# Patient Record
Sex: Female | Born: 1955 | Race: Black or African American | Hispanic: No | Marital: Married | Smoking: Never smoker
Health system: Southern US, Community
[De-identification: ages and names within clinical notes are randomized; demographics above are authoritative.]

## PROBLEM LIST (undated history)

## (undated) ENCOUNTER — Ambulatory Visit (HOSPITAL_COMMUNITY): Admission: EM | Payer: Medicare Other | Source: Home / Self Care

## (undated) DIAGNOSIS — I10 Essential (primary) hypertension: Secondary | ICD-10-CM

## (undated) DIAGNOSIS — E785 Hyperlipidemia, unspecified: Secondary | ICD-10-CM

## (undated) DIAGNOSIS — D219 Benign neoplasm of connective and other soft tissue, unspecified: Secondary | ICD-10-CM

## (undated) DIAGNOSIS — R42 Dizziness and giddiness: Secondary | ICD-10-CM

## (undated) DIAGNOSIS — E876 Hypokalemia: Secondary | ICD-10-CM

## (undated) DIAGNOSIS — IMO0002 Reserved for concepts with insufficient information to code with codable children: Secondary | ICD-10-CM

## (undated) DIAGNOSIS — N12 Tubulo-interstitial nephritis, not specified as acute or chronic: Secondary | ICD-10-CM

## (undated) HISTORY — PX: BACK SURGERY: SHX140

## (undated) HISTORY — DX: Hyperlipidemia, unspecified: E78.5

## (undated) HISTORY — PX: SKIN BIOPSY: SHX1

## (undated) HISTORY — PX: NECK SURGERY: SHX720

## (undated) HISTORY — DX: Benign neoplasm of connective and other soft tissue, unspecified: D21.9

---

## 1998-03-24 ENCOUNTER — Emergency Department (HOSPITAL_COMMUNITY): Admission: EM | Admit: 1998-03-24 | Discharge: 1998-03-24 | Payer: Self-pay | Admitting: Emergency Medicine

## 1998-03-26 ENCOUNTER — Emergency Department (HOSPITAL_COMMUNITY): Admission: EM | Admit: 1998-03-26 | Discharge: 1998-03-26 | Payer: Self-pay | Admitting: Emergency Medicine

## 1998-07-27 ENCOUNTER — Ambulatory Visit (HOSPITAL_COMMUNITY): Admission: RE | Admit: 1998-07-27 | Discharge: 1998-07-27 | Payer: Self-pay

## 1999-08-07 ENCOUNTER — Emergency Department (HOSPITAL_COMMUNITY): Admission: EM | Admit: 1999-08-07 | Discharge: 1999-08-07 | Payer: Self-pay | Admitting: Emergency Medicine

## 1999-08-07 ENCOUNTER — Encounter: Payer: Self-pay | Admitting: Emergency Medicine

## 1999-12-23 HISTORY — PX: CHOLECYSTECTOMY: SHX55

## 1999-12-23 HISTORY — PX: ERCP W/ SPHICTEROTOMY: SHX1523

## 2000-03-07 ENCOUNTER — Emergency Department (HOSPITAL_COMMUNITY): Admission: EM | Admit: 2000-03-07 | Discharge: 2000-03-07 | Payer: Self-pay | Admitting: *Deleted

## 2000-03-09 ENCOUNTER — Ambulatory Visit (HOSPITAL_COMMUNITY): Admission: RE | Admit: 2000-03-09 | Discharge: 2000-03-09 | Payer: Self-pay | Admitting: *Deleted

## 2000-03-09 ENCOUNTER — Encounter: Payer: Self-pay | Admitting: *Deleted

## 2000-03-23 ENCOUNTER — Encounter (INDEPENDENT_AMBULATORY_CARE_PROVIDER_SITE_OTHER): Payer: Self-pay | Admitting: Specialist

## 2000-03-23 ENCOUNTER — Encounter: Payer: Self-pay | Admitting: General Surgery

## 2000-03-24 ENCOUNTER — Encounter: Payer: Self-pay | Admitting: Internal Medicine

## 2000-03-24 ENCOUNTER — Inpatient Hospital Stay (HOSPITAL_COMMUNITY): Admission: RE | Admit: 2000-03-24 | Discharge: 2000-03-25 | Payer: Self-pay | Admitting: General Surgery

## 2000-07-22 ENCOUNTER — Emergency Department (HOSPITAL_COMMUNITY): Admission: EM | Admit: 2000-07-22 | Discharge: 2000-07-22 | Payer: Self-pay | Admitting: Emergency Medicine

## 2001-01-18 ENCOUNTER — Emergency Department (HOSPITAL_COMMUNITY): Admission: EM | Admit: 2001-01-18 | Discharge: 2001-01-19 | Payer: Self-pay | Admitting: Emergency Medicine

## 2001-01-20 ENCOUNTER — Emergency Department (HOSPITAL_COMMUNITY): Admission: EM | Admit: 2001-01-20 | Discharge: 2001-01-20 | Payer: Self-pay | Admitting: Emergency Medicine

## 2002-06-15 ENCOUNTER — Emergency Department (HOSPITAL_COMMUNITY): Admission: EM | Admit: 2002-06-15 | Discharge: 2002-06-15 | Payer: Self-pay | Admitting: Emergency Medicine

## 2002-06-20 ENCOUNTER — Emergency Department (HOSPITAL_COMMUNITY): Admission: EM | Admit: 2002-06-20 | Discharge: 2002-06-20 | Payer: Self-pay | Admitting: Emergency Medicine

## 2002-06-20 ENCOUNTER — Encounter: Payer: Self-pay | Admitting: Emergency Medicine

## 2002-06-26 ENCOUNTER — Emergency Department (HOSPITAL_COMMUNITY): Admission: EM | Admit: 2002-06-26 | Discharge: 2002-06-26 | Payer: Self-pay | Admitting: Emergency Medicine

## 2002-07-01 ENCOUNTER — Emergency Department (HOSPITAL_COMMUNITY): Admission: EM | Admit: 2002-07-01 | Discharge: 2002-07-01 | Payer: Self-pay | Admitting: Emergency Medicine

## 2002-07-06 ENCOUNTER — Ambulatory Visit (HOSPITAL_COMMUNITY): Admission: RE | Admit: 2002-07-06 | Discharge: 2002-07-06 | Payer: Self-pay | Admitting: Internal Medicine

## 2002-07-06 ENCOUNTER — Encounter: Payer: Self-pay | Admitting: Internal Medicine

## 2002-12-22 DIAGNOSIS — N12 Tubulo-interstitial nephritis, not specified as acute or chronic: Secondary | ICD-10-CM

## 2002-12-22 HISTORY — DX: Tubulo-interstitial nephritis, not specified as acute or chronic: N12

## 2003-01-24 ENCOUNTER — Ambulatory Visit (HOSPITAL_COMMUNITY): Admission: RE | Admit: 2003-01-24 | Discharge: 2003-01-24 | Payer: Self-pay | Admitting: Family Medicine

## 2003-01-27 ENCOUNTER — Encounter: Payer: Self-pay | Admitting: Emergency Medicine

## 2003-01-27 ENCOUNTER — Inpatient Hospital Stay (HOSPITAL_COMMUNITY): Admission: EM | Admit: 2003-01-27 | Discharge: 2003-01-30 | Payer: Self-pay | Admitting: Emergency Medicine

## 2003-01-29 ENCOUNTER — Encounter: Payer: Self-pay | Admitting: Family Medicine

## 2003-01-31 ENCOUNTER — Inpatient Hospital Stay (HOSPITAL_COMMUNITY): Admission: EM | Admit: 2003-01-31 | Discharge: 2003-02-02 | Payer: Self-pay | Admitting: Emergency Medicine

## 2003-02-01 ENCOUNTER — Encounter: Payer: Self-pay | Admitting: Family Medicine

## 2003-02-06 ENCOUNTER — Encounter: Admission: RE | Admit: 2003-02-06 | Discharge: 2003-02-06 | Payer: Self-pay | Admitting: Family Medicine

## 2003-05-11 ENCOUNTER — Inpatient Hospital Stay (HOSPITAL_COMMUNITY): Admission: AD | Admit: 2003-05-11 | Discharge: 2003-05-11 | Payer: Self-pay | Admitting: Family Medicine

## 2003-07-04 ENCOUNTER — Encounter: Admission: RE | Admit: 2003-07-04 | Discharge: 2003-07-04 | Payer: Self-pay | Admitting: Internal Medicine

## 2003-08-08 ENCOUNTER — Encounter: Admission: RE | Admit: 2003-08-08 | Discharge: 2003-08-08 | Payer: Self-pay | Admitting: Internal Medicine

## 2003-09-02 ENCOUNTER — Encounter: Payer: Self-pay | Admitting: Emergency Medicine

## 2003-09-02 ENCOUNTER — Emergency Department (HOSPITAL_COMMUNITY): Admission: EM | Admit: 2003-09-02 | Discharge: 2003-09-02 | Payer: Self-pay | Admitting: Emergency Medicine

## 2003-09-11 ENCOUNTER — Encounter: Admission: RE | Admit: 2003-09-11 | Discharge: 2003-09-11 | Payer: Self-pay | Admitting: Internal Medicine

## 2003-10-19 ENCOUNTER — Encounter: Admission: RE | Admit: 2003-10-19 | Discharge: 2003-10-19 | Payer: Self-pay | Admitting: Obstetrics and Gynecology

## 2003-10-25 ENCOUNTER — Encounter: Admission: RE | Admit: 2003-10-25 | Discharge: 2003-10-25 | Payer: Self-pay | Admitting: Internal Medicine

## 2003-10-27 ENCOUNTER — Ambulatory Visit (HOSPITAL_COMMUNITY): Admission: RE | Admit: 2003-10-27 | Discharge: 2003-10-27 | Payer: Self-pay | Admitting: Obstetrics and Gynecology

## 2003-11-21 ENCOUNTER — Encounter: Admission: RE | Admit: 2003-11-21 | Discharge: 2003-11-21 | Payer: Self-pay | Admitting: Internal Medicine

## 2004-01-04 ENCOUNTER — Ambulatory Visit (HOSPITAL_COMMUNITY): Admission: RE | Admit: 2004-01-04 | Discharge: 2004-01-04 | Payer: Self-pay | Admitting: Internal Medicine

## 2004-01-04 ENCOUNTER — Encounter: Admission: RE | Admit: 2004-01-04 | Discharge: 2004-01-04 | Payer: Self-pay | Admitting: Internal Medicine

## 2004-02-28 ENCOUNTER — Encounter: Admission: RE | Admit: 2004-02-28 | Discharge: 2004-02-28 | Payer: Self-pay | Admitting: Internal Medicine

## 2004-05-09 ENCOUNTER — Ambulatory Visit (HOSPITAL_COMMUNITY): Admission: RE | Admit: 2004-05-09 | Discharge: 2004-05-09 | Payer: Self-pay | Admitting: Obstetrics

## 2004-10-21 ENCOUNTER — Emergency Department (HOSPITAL_COMMUNITY): Admission: EM | Admit: 2004-10-21 | Discharge: 2004-10-21 | Payer: Self-pay | Admitting: Family Medicine

## 2004-10-23 ENCOUNTER — Emergency Department (HOSPITAL_COMMUNITY): Admission: EM | Admit: 2004-10-23 | Discharge: 2004-10-23 | Payer: Self-pay

## 2005-03-01 ENCOUNTER — Emergency Department (HOSPITAL_COMMUNITY): Admission: EM | Admit: 2005-03-01 | Discharge: 2005-03-01 | Payer: Self-pay | Admitting: Emergency Medicine

## 2005-09-09 ENCOUNTER — Emergency Department (HOSPITAL_COMMUNITY): Admission: EM | Admit: 2005-09-09 | Discharge: 2005-09-09 | Payer: Self-pay | Admitting: Family Medicine

## 2005-12-30 ENCOUNTER — Emergency Department (HOSPITAL_COMMUNITY): Admission: EM | Admit: 2005-12-30 | Discharge: 2005-12-30 | Payer: Self-pay | Admitting: Emergency Medicine

## 2006-01-02 ENCOUNTER — Encounter: Admission: RE | Admit: 2006-01-02 | Discharge: 2006-01-02 | Payer: Self-pay | Admitting: Internal Medicine

## 2007-02-09 ENCOUNTER — Emergency Department (HOSPITAL_COMMUNITY): Admission: EM | Admit: 2007-02-09 | Discharge: 2007-02-09 | Payer: Self-pay | Admitting: Family Medicine

## 2007-03-28 ENCOUNTER — Emergency Department (HOSPITAL_COMMUNITY): Admission: EM | Admit: 2007-03-28 | Discharge: 2007-03-28 | Payer: Self-pay | Admitting: Emergency Medicine

## 2007-08-18 ENCOUNTER — Emergency Department (HOSPITAL_COMMUNITY): Admission: EM | Admit: 2007-08-18 | Discharge: 2007-08-18 | Payer: Self-pay | Admitting: Family Medicine

## 2007-09-29 ENCOUNTER — Ambulatory Visit (HOSPITAL_COMMUNITY): Admission: RE | Admit: 2007-09-29 | Discharge: 2007-09-29 | Payer: Self-pay | Admitting: *Deleted

## 2007-10-05 ENCOUNTER — Encounter: Admission: RE | Admit: 2007-10-05 | Discharge: 2007-10-05 | Payer: Self-pay | Admitting: *Deleted

## 2007-11-08 ENCOUNTER — Emergency Department (HOSPITAL_COMMUNITY): Admission: EM | Admit: 2007-11-08 | Discharge: 2007-11-08 | Payer: Self-pay | Admitting: Emergency Medicine

## 2008-07-29 ENCOUNTER — Emergency Department (HOSPITAL_COMMUNITY): Admission: EM | Admit: 2008-07-29 | Discharge: 2008-07-29 | Payer: Self-pay | Admitting: Emergency Medicine

## 2008-11-27 ENCOUNTER — Ambulatory Visit (HOSPITAL_COMMUNITY): Admission: RE | Admit: 2008-11-27 | Discharge: 2008-11-27 | Payer: Self-pay | Admitting: *Deleted

## 2009-01-01 ENCOUNTER — Emergency Department (HOSPITAL_COMMUNITY): Admission: EM | Admit: 2009-01-01 | Discharge: 2009-01-01 | Payer: Self-pay | Admitting: Emergency Medicine

## 2009-03-02 ENCOUNTER — Encounter: Admission: RE | Admit: 2009-03-02 | Discharge: 2009-03-02 | Payer: Self-pay | Admitting: Neurosurgery

## 2009-03-14 ENCOUNTER — Encounter: Admission: RE | Admit: 2009-03-14 | Discharge: 2009-03-14 | Payer: Self-pay | Admitting: Neurosurgery

## 2009-04-05 ENCOUNTER — Emergency Department (HOSPITAL_COMMUNITY): Admission: EM | Admit: 2009-04-05 | Discharge: 2009-04-05 | Payer: Self-pay | Admitting: Family Medicine

## 2009-04-07 ENCOUNTER — Emergency Department (HOSPITAL_COMMUNITY): Admission: EM | Admit: 2009-04-07 | Discharge: 2009-04-07 | Payer: Self-pay | Admitting: Emergency Medicine

## 2009-06-18 ENCOUNTER — Encounter: Admission: RE | Admit: 2009-06-18 | Discharge: 2009-06-18 | Payer: Self-pay | Admitting: Internal Medicine

## 2009-08-19 ENCOUNTER — Encounter: Admission: RE | Admit: 2009-08-19 | Discharge: 2009-08-19 | Payer: Self-pay | Admitting: Neurosurgery

## 2009-10-25 ENCOUNTER — Encounter: Admission: RE | Admit: 2009-10-25 | Discharge: 2009-10-25 | Payer: Self-pay | Admitting: Orthopaedic Surgery

## 2010-12-19 ENCOUNTER — Emergency Department (HOSPITAL_COMMUNITY)
Admission: EM | Admit: 2010-12-19 | Discharge: 2010-12-19 | Payer: Self-pay | Source: Home / Self Care | Admitting: Emergency Medicine

## 2010-12-27 ENCOUNTER — Ambulatory Visit (HOSPITAL_COMMUNITY)
Admission: RE | Admit: 2010-12-27 | Discharge: 2010-12-27 | Payer: Self-pay | Source: Home / Self Care | Attending: Internal Medicine | Admitting: Internal Medicine

## 2011-01-12 ENCOUNTER — Encounter: Payer: Self-pay | Admitting: Internal Medicine

## 2011-01-12 ENCOUNTER — Encounter: Payer: Self-pay | Admitting: Obstetrics and Gynecology

## 2011-01-12 ENCOUNTER — Encounter: Payer: Self-pay | Admitting: *Deleted

## 2011-01-13 ENCOUNTER — Encounter: Payer: Self-pay | Admitting: *Deleted

## 2011-01-14 ENCOUNTER — Inpatient Hospital Stay (HOSPITAL_COMMUNITY)
Admission: AD | Admit: 2011-01-14 | Discharge: 2011-01-14 | Payer: Self-pay | Source: Home / Self Care | Attending: Obstetrics & Gynecology | Admitting: Obstetrics & Gynecology

## 2011-02-05 ENCOUNTER — Emergency Department (HOSPITAL_COMMUNITY)
Admission: EM | Admit: 2011-02-05 | Discharge: 2011-02-05 | Disposition: A | Discharge status: Home / Self Care | Payer: Self-pay | Attending: Emergency Medicine | Admitting: Emergency Medicine

## 2011-02-05 DIAGNOSIS — L2989 Other pruritus: Secondary | ICD-10-CM | POA: Insufficient documentation

## 2011-02-05 DIAGNOSIS — L298 Other pruritus: Secondary | ICD-10-CM | POA: Insufficient documentation

## 2011-02-05 DIAGNOSIS — R21 Rash and other nonspecific skin eruption: Secondary | ICD-10-CM | POA: Insufficient documentation

## 2011-02-05 DIAGNOSIS — I10 Essential (primary) hypertension: Secondary | ICD-10-CM | POA: Insufficient documentation

## 2011-02-05 DIAGNOSIS — Z79899 Other long term (current) drug therapy: Secondary | ICD-10-CM | POA: Insufficient documentation

## 2011-02-14 ENCOUNTER — Other Ambulatory Visit: Payer: Self-pay | Admitting: Dermatology

## 2011-04-02 LAB — POCT I-STAT, CHEM 8
BUN: 13 mg/dL (ref 6–23)
Creatinine, Ser: 0.7 mg/dL (ref 0.4–1.2)
Glucose, Bld: 122 mg/dL — ABNORMAL HIGH (ref 70–99)
HCT: 40 % (ref 36.0–46.0)
Hemoglobin: 13.6 g/dL (ref 12.0–15.0)
TCO2: 25 mmol/L (ref 0–100)

## 2011-04-02 LAB — DIFFERENTIAL
Basophils Absolute: 0 10*3/uL (ref 0.0–0.1)
Eosinophils Absolute: 0.1 10*3/uL (ref 0.0–0.7)
Eosinophils Relative: 1 % (ref 0–5)
Lymphocytes Relative: 45 % (ref 12–46)
Monocytes Relative: 4 % (ref 3–12)
Neutrophils Relative %: 49 % (ref 43–77)

## 2011-04-02 LAB — CBC
MCHC: 34.1 g/dL (ref 30.0–36.0)
Platelets: 224 10*3/uL (ref 150–400)
RBC: 4.26 MIL/uL (ref 3.87–5.11)
WBC: 5.4 10*3/uL (ref 4.0–10.5)

## 2011-04-07 LAB — CBC
HCT: 36.4 % (ref 36.0–46.0)
Hemoglobin: 12.4 g/dL (ref 12.0–15.0)
MCHC: 34 g/dL (ref 30.0–36.0)
RBC: 3.9 MIL/uL (ref 3.87–5.11)
RDW: 14.2 % (ref 11.5–15.5)

## 2011-04-07 LAB — DIFFERENTIAL
Basophils Absolute: 0 10*3/uL (ref 0.0–0.1)
Eosinophils Relative: 1 % (ref 0–5)
Lymphocytes Relative: 39 % (ref 12–46)
Lymphs Abs: 2.6 10*3/uL (ref 0.7–4.0)
Neutro Abs: 3.8 10*3/uL (ref 1.7–7.7)

## 2011-04-07 LAB — BASIC METABOLIC PANEL
CO2: 22 mEq/L (ref 19–32)
Calcium: 9.3 mg/dL (ref 8.4–10.5)
GFR calc Af Amer: >60 mL/min (ref >60)
GFR calc non Af Amer: >60 mL/min (ref >60)
Glucose, Bld: 94 mg/dL (ref 70–99)
Potassium: 3.5 mEq/L (ref 3.5–5.1)
Sodium: 139 mEq/L (ref 135–145)

## 2011-04-07 LAB — TROPONIN I: Troponin I: 0.01 ng/mL (ref 0.00–0.06)

## 2011-04-07 LAB — CK TOTAL AND CKMB (NOT AT ARMC)
CK, MB: 3.9 ng/mL (ref 0.3–4.0)
Relative Index: 1.6 (ref 0.0–2.5)

## 2011-04-07 LAB — PROTIME-INR: Prothrombin Time: 13.4 seconds (ref 11.6–15.2)

## 2011-04-07 LAB — APTT: aPTT: 30 seconds (ref 24–37)

## 2011-05-09 NOTE — Group Therapy Note (Signed)
   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  The patient  has had in the past a cesarean section and a cholecystectomy.  She comes in  with a complaint of abdominal bloating, dysmenorrhea with severe right-sided  and back pain during that time.  She has been otherwise in fairly good  health and is on hydrochlorothiazide for hypertension.  At this time her  blood pressures are normal 137/89.  She weighs 208 pounds and is 5 feet 3  inches.  She had a sonogram done in January that showed two fibroids, one of  6.3 x 5.8 diameter and the second was 5.2 x 4.1.  Also had a recent CAT scan  because of abdominal pain and showed a slightly enlarged right kidney.   PHYSICAL EXAMINATION:  PELVIC:  External genitalia is normal.  BUS within  normal.  Vagina was clean and well rugated.  Cervix was clean and no recent  Pap smear had been done.  The uterus was not easily outlined but there was  obviously fibroid into the cul-de-sac and enlargement of the uterus I would  estimate in total felt like about a 12-14 week sized uterus.   ASSESSMENT/PLAN:  We discussed with the patient possible treatment.  Will  repeat the sonogram to see if it was not a significant growth in the last  six months and will discuss the possibility of hysterectomy versus waiting  until she goes through menopause if she can take ibuprofen for the pain.  Once we get the sonogram we will reconsult with the patient.                                               Argentina Donovan, MD    PR/MEDQ  D:  10/19/2003  T:  10/20/2003  Job:  [id number]

## 2011-05-09 NOTE — Procedures (Signed)
[place]  Patient:    Tasha [name], Tasha [name]                           MRN: [id number] Proc. Date: 03/24/00 Adm. Date:  [id number] Disc. Date: [id number] Attending:  Henrene Dodge CC:         Anselm Pancoast. Zachery Dakins, M.D.                           Procedure Report  PROCEDURE:  Endoscopic retrograde cholangiopancreatography  INDICATIONS:  This 55 year old black female presented with symptomatic cholelithiasis.  She underwent cholecystectomy yesterday with findings of retained common bile duct stones in the common bile duct.  There was also irregularity and incomplete filling of the hepatic duct.  The patient is undergoing today ERCP with plans to remove the common bile duct stones.  ENDOSCOPE:  Olympus single channel side-viewing duodenoscope.  SEDATION:  Versed 10 mg IV, droperidol 5 mg IV, Demerol 100 mg IV, glucagon 2-1/2 mg IV.  FINDINGS:  Olympus single channel side-viewing duodenoscope was advanced via the esophagus into the stomach.  Gastric mucosa was normal with normal pyloric outlet and duodenal bulb.  Accessory papilla was fistulized proximal to the main papilla. The main papilla had some abrasions on it and mild edema.  It was cannulated without difficulty in the short arm, normal common bile duct with multiple small filling defects consistent with stones.  This was confirmed by fluoroscopy provided by Dr. Ronney Asters.  Stones measured 3-5 mm in diameter.  Some of them were floating in the common bile duct.  Sphincterotomy was carried out with a regular sphincterotome and 8-1/2 mm Wilson-Cook balloon passed over the guidewire into he common bile duct, into the common hepatic ducts.  Cholangiogram at that time was obtained and showed complete filling of the intrahepatic ducts and hepatic radicals with no evidence of stricture.  Several sweeps of the common bile duct were made with 8-1/2 mm balloon which initially had to be  deflated to pass through the distal common bile duct.  Sphincterotomy was enlarged to accommodate the entire 8-1/2 mm balloon. Several stones were evacuated and video photographs of these were obtained for documentation but there remained a filling defect in the distal common bile duct thought to be a retained stone.  For that reason sphincterotomy was still enlarged and a 12 mm balloon was passed over the guidewire under fluoroscopic guidance into the proximal hepatic ducts and passed partially deflated through the common bile ducts.  At the end of the procedure all retained stones were removed.  The main pancreatic duct was not intentionally visualized.  The patient tolerated the procedure well.  IMPRESSION: 1. Choledocholithiasis status post endoscopic sphincterotomy and multiple stone    extractions. 2. Status post surgical cholecystectomy.  PLAN: 1. Observation. 2. Will repeat LFTs, amylase in the morning. 3. Continue Unisyn x 3 doses. DD:  03/25/00 TD:  03/25/00 Job: [id number]

## 2011-05-09 NOTE — Discharge Summary (Signed)
[name], [name]                              ACCOUNT NO.:  [id number]   MEDICAL RECORD NO.:  [id number]                   PATIENT TYPE:  INP   LOCATION:  5533                                 FACILITY:  [facility]   PHYSICIAN:  Santiago Bumpers. Hensel, M.D.             DATE OF BIRTH:  29-Sep-1956   DATE OF ADMISSION:  01/31/2003  DATE OF DISCHARGE:  02/02/2003                                 DISCHARGE SUMMARY   DISCHARGE DIAGNOSES:  1. Pyelonephritis.  2. Dehydration.  3. Uncontrolled pain.  4. Hypertension.  5. Hypokalemia.   DISCHARGE MEDICATIONS:  1. Cipro 500 mg 1 tablet twice a day for a total of 14 days (9 more days     after discharge).  2. Phenergan 25 mg 1 tablet q.6h. p.r.n. nausea.  3. Tramadol 1 tablet q.6h. p.r.n. pain.   PAIN MANAGEMENT:  As noted above.   DIET:  As tolerated.   ACTIVITY:  No restrictions.   FOLLOW UP:  The patient will need to follow up at [facility] approximately  two week for repeat urinalysis.  The patient was also given a work release  note stating she was under our care from initial admission on 01/28/2003  through 02/02/2003.   BRIEF HISTORY:  The patient is a 55 year old African-American female who was  discharged from the hospital one day prior to admission with pyelonephritis.  The patient had a negative urine culture initially, was able to tolerate  p.o.'s, had good pain control, had no nausea and vomiting, and fever was  trending down.  The patient claimed that after day of discharge to be unable  to tolerate any further p.o. intake, had not taken her pain medicine or  Phenergan at that point in time.  She was complaining of severe 9/10 flank  and stomach pain that was radiating to her back.  The patient was  complaining also of pain with urination, and she claims to have fevers as  high as 102 orally despite being afebrile in the ER.  In the ER, the  patient's temperature was 98.6, pulse 102, blood pressure 126/73,  respirations 20, O2  saturation 99% on room air.  The patient was discharged  on Phenergan, Tramadol, and Cipro 500 mg b.i.d..   HOSPITAL COURSE:  1. PYELONEPHRITIS:  The patient was started on clear liquids, had fluid     started with a fluid bolus of 1 liter and was maintained on IV fluids 150     an hour.  The patient was given Phenergan and IV morphine and restarted     on IV Cipro. The patient was soon able to tolerate p.o. liquids, was     switched to p.o. Cipro, switched over to p.o. Phenergan and p.o. Percocet     for pain control.  The patient demonstrated ability to take p.o.     medicines and p.o. intake as well.  The patient, on the day of discharge,     was pain free, no abdominal pain, no flank pain, and no dysuria.  The     patient denies any nausea or vomiting.  The patient is ready to go home     on day of admission and was afebrile at the time of discharge.  Renal     ultrasound obtained during hospital course for possible abscess revealed     no hydronephrosis, no abscess, and no perinephric fluid.  The patient had     decreased white count.  Her white count at time of discharge was 9.5, and     a Gram stain showed gram-negative rods.  Urine cultures are still     pending,and we will check for resistant organisms to Cipro.  The patient     will be discharged on a total of 9 more days of Cipro 500 mg b.i.d. for a     total 14-day course.  Repeat UA is recommended at [facility] in     approximately one to two weeks for followup visit.   1. HYPOKALEMIA:  Potassium was repleted orally, and potassium was added to     IV fluids as well.  Potassium at time of discharge was within normal     limits.   1. HYPERTENSION:  The patient had baseline hypertension at home. Blood     pressure was 126/73 upon admission.  The patient was restarted on her     home dose of hydrochlorothiazide 25 mg p.o. daily.   1. NOTE FOR HEALTH SERVE:  The patient demonstrates unusual behavior during     two  hospitalizations including manipulative behavior and expressed     concern over difficult social situations at home with high anxiety levels     including tearing and shortness of breath secondary to anxiety about     returning to home situation the first time at discharge.  Would recommend     outpatient psychiatric evaluation for possible help with anxiety and     possible personality disorders.   CONDITION ON DISCHARGE:  The patient will be discharged on 02/02/2003 in  stable and improved condition.      Tasha [name], M.D.                      Tasha [name], M.D.    RM/MEDQ  D:  02/02/2003  T:  02/02/2003  Job:  [id number]   cc:   [facility]

## 2011-05-09 NOTE — Discharge Summary (Signed)
Tasha [name], Tasha [name]                              ACCOUNT NO.:  [id number]   MEDICAL RECORD NO.:  [id number]                   PATIENT TYPE:  INP   LOCATION:  5502                                 FACILITY:  [facility]   PHYSICIAN:  Alvira Philips, M.D.                DATE OF BIRTH:  [birth year]   DATE OF ADMISSION:  01/27/2003  DATE OF DISCHARGE:  01/30/2003                                 DISCHARGE SUMMARY   DISCHARGE DIAGNOSES:  1. Pyelonephritis.  2. Hypokalemia.  3. Hypertension.   DISCHARGE MEDICATIONS:  1. Cipro 500 mg p.o. b.i.d. x12 days.  2. Albuterol 2.5 mg two puffs q.4h. as needed for shortness of breath and     wheezing.  3. Phenergan 12.5 mg one tablet q.6h. p.r.n. for nausea.  4. Ibuprofen 650 mg one tablet q.6h. as needed for pain.  5. The patient also given a prescription for Percocet 5/325 one to two     tablets q.6h. as needed for pain.   ACTIVITY:  As tolerated.   DIET:  Push fluids while taking antibiotics.   FOLLOW UP:  The patient is to follow up with [facility] in approximately  one week and to call for an appointment.   HISTORY OF PRESENT ILLNESS:  For full admission history and physical, please  see history and physical in chart.  The patient is a 55 year old with  complaints of abdominal and back pain, fever x3 days.  The patient was seen  at [facility] three days ago and told that she has a cyst on the  uterus.  The patient began vomiting today with a temperature of 103.  The  patient admits to some pain with urination that started three days ago with  increased frequency, increased urgency.  Her last menstrual period was  January 05, 2003.  Last sexual activity was approximately one month ago.  The patient admits that she has had bladder infections in the past but does  not know when.  The patient denies any past medical history for  hypertension.  The patient has had previous low transverse cesarean section  for prior pregnancy.  The patient  presented with bilateral CVA tenderness  with a UA which showed small bilirubin, 100 protein, negative nitrate, 1  large leukocyte esterase, many bacteria, 21 to 50 wbc, 11 to 20 rbc. Wet  prep showed some moderate clue cells.  The patient had elevated white count  of 18.6 with 94% polys.  The patient was admitted with the following  diagnoses.   HOSPITAL COURSE:  PROBLEM #1 -  BILATERAL PYELONEPHRITIS:  The patient was  started on Cipro 400 mg IV b.i.d. and given Phenergan and intravenous  fluids.  The patient had UA sent for culture which showed no growth x1 day.  The patient had GC and Chlamydia also sent as well.  Blood cultures were  also negative.  GC  and Chlamydia both came back negative.  The patient  tolerated intravenous fluids and IV antibiotics well but remained febrile  for the first approximately 24 hours with a T-max of 103.9 which is at the  time of admission.  The patient had overall trending down of fever curve  with some cyclic fevers.  The patient's mental status was normal.  The  patient continued on aggressive hydration.  The patient continued to show  improvement throughout hospital course and was switched over to p.o.  antibiotics and remained afebrile x36 hours prior to discharge.  The patient  still had mild fevers at the time of discharge of approximately 101.6 but  was afebrile for approximately 24 hours before time of discharge.  The  patient was able to tolerate good p.o. intake, able to keep down her pain  medicines and was wanting to be discharged home.  The patient showed  significant clinical improvement, and overall fever curve was trending down.  The patient was discharged with the following noted medications and told to  return if symptoms worsened.   PROBLEM #2 -  HYPOKALEMIA:  The patient's potassium at the time of admission  was 2.3, likely secondary to emesis.  The patient was initially repleted  with IV potassium per intravenous fluid bag and  then repleted with p.o.  potassium  as needed.  Potassium at the time of discharge was 4.1.  No  further therapy needed.   PROBLEM #3 -  HYPERTENSION:  The patient's blood pressure was currently  stable at the time of admission.  It was 107/66.  It was slightly low  secondary to likely hypokalemia.  The patient was continued on her  outpatient hydrochlorothiazide but was later held secondary to dehydration  and low potassium.  The patient's blood pressure controlled at time of  discharge which was ranging 110s to 140s/60s to 90s.  The patient will  likely restart hydrochlorothiazide as an outpatient once follow-up is done  at [facility].   PROBLEM #4 -  WHEEZING:  The patient started noting some wheezing the day  prior to discharge with stable O2 saturations but with some cough on  examination.  The patient had a chest x-ray which showed some bibasilar  atelectasis.  The patient had been lying in bed for approximately two or  three days and the patient is a long time smoker.  We gave the patient a  prescription for albuterol which improved her wheezing.  The patient is to  continue p.r.n. and told about the risks and detriments of smoking and was  encouraged for smoking cessation and given educational and resource  availability. The patient chooses not to quit smoking at this point in time.  The patient will follow up at [facility] for continued aggressive smoking  cessation.   DISPOSITION:  The patient was discharged on January 30, 2003, in stable and  improved condition and will follow up as noted above.                                               Alvira Philips, M.D.    RM/MEDQ  D:  03/14/2003  T:  03/15/2003  Job:  [id number]

## 2011-05-09 NOTE — Discharge Summary (Signed)
[place]  Patient:    Tasha, [name]                           MRN: [id number] Adm. Date:  [id number] Disc. Date: [id number] Attending:  Henrene Dodge CC:         Anselm Pancoast. Zachery Dakins, M.D.             Hedwig Morton. Juanda Chance, M.D. 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  OPERATIONS: 1. Laparoscopic cholecystectomy. 2. Postoperatively she had an ERCP with sphincterotomy, Dr. Lina Sar.  HISTORY OF PRESENT ILLNESS AND HOSPITAL COURSE:  Tasha [name] is a 55 year old black female who was referred to our office for management of symptomatic gallstones.  The patient has had known episodes of epigastric pain.  She said that probably six, seven, or eight years ago when she was in prison for a drug problem, she was diagnosed as having gallstones.  No recommendation was made as far as surgery. Recently, she has been having more episodes of epigastric pain, sometime radiating to the small of her back.  This past weekend prior to her admission, she had a bad episode of pain, went to the emergency room at [facility], and they referred her for an ultrasound of the gallbladder after treatment with pain medication.  The ultrasound showed definite stones in her gallbladder, but you could also see a mildly dilated common bile duct and stones in the distal common bile duct.  I saw her in the office and, on questioning, she appeared to really not be having acute episodes of pain, and we elected to proceed on with a laparoscopic cholecystectomy, followed by an ERCP unless her liver enzymes were definitely abnormal.  Clinically, she did not appear jaundiced to me, and her laboratory studies that were measured preoperatively shows that she has an elevated alkaline phosphatase and SGOT in the range of about 340 alkaline phosphatase, and the OT and PT is about 150. I thought with this  it would be fine to go ahead and proceed with the surgery, which we did.  At the time of surgery with the cholangiogram, it definitely showed stones in the distal common bile duct.  Postoperatively, I had Dr. Lina Sar see Ms. Nedra Hai, and she evaluated her, agreed with the plans for ERCP, and did that the following day with sphincterotomy and removal of stones.  There had been some question at the time of surgery whether there was a little stricture in the right hepatic duct, and it appears that the common cystic duct enters into the right hepatic duct, not really the common bile duct but, at the time of the ERCP, that area was cleared, and this was probably just operative changes on her cholangiogram at surgery.  The first morning after the ERCP she was doing fine.  She was tolerating her breakfast.  Her amylase was about 383 but, clinically she does not have pancreatitis, and her repeat liver tests were all improved.  Alkaline phosphatase was 198, SGOT of 176.  I think she is ready for discharge in improved condition.  Her white count was 7.4 with hematocrit of 34.  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  She has a few Percocet for pain.  I will see her in the office for follow-up in approximately a week. DD:  03/25/00 TD:  03/25/00 Job: [id number]

## 2011-05-09 NOTE — Procedures (Signed)
[place]. [facility]  Patient:    Tasha, [name]                           MRN: [id number] Proc. Date: 03/23/00 Adm. Date:  [id number] Disc. Date: [id number] Attending:  Henrene Dodge CC:         Anselm Pancoast. Zachery Dakins, M.D.                           Procedure Report  PREOPERATIVE DIAGNOSIS:  Chronic cholecystitis with common duct stones.  POSTOPERATIVE DIAGNOSIS:  Chronic cholecystitis with common duct stones.  OPERATION PERFORMED:  Laparoscopic cholecystectomy with cholangiogram.  SURGEON:  Anselm Pancoast. Zachery Dakins, M.D.  ANESTHESIA:  General anesthesia.  CLINICAL HISTORY:  Tasha [name] is a 55 year old black female who has known she has had gallstones for probably seven or eight years.  She said that she was first diagnosed as having stones when she was in prison for a drug problem. No recommendation was made regarding surgery. She has since then been released and has been drug-free supposedly six or seven years.  She recently has started having epigastric episodes of pain radiating to the right upper quadrant and was in the emergency room where they sent her for an ultrasound of gallbladder after treating her with pain medication.  The ultrasound showed definitely stones impacted in the gallbladder but also a dilated common bile duct, and what was felt to be stones in the common bile duct.  They did not do any liver tests.  They referred her to my office and I saw her.  I told her I would recommend that if:  1. If her liver tests were definitely abnormal as far as elevated bilirubin, etc, we would proceed with an ERC first; if it was just the alkaline phosphatase and SGOT abnormal, we will proceed with a laparoscopic cholecystectomy, but most likely she will need a postoperative ERCP.  She was in agreement with this.  Her bilirubin was normal.  Her alkaline phosphatase, SGOT, and SGPT were mildly elevated.  DESCRIPTION OF PROCEDURE:  The patient was  taken to the operative suite, and induction of general anesthesia, endotracheal tube and oral tube into the stomach.  After she was asleep. the abdomen was prepped with Betadine surgical scrub and solution, and draped in a sterile manner.  A small vertical incision below the umbilicus was made. The fascia was identified, picked between Kochers, and a small opening was made.  The underlying peritoneum identified, opened with a Kelly, and then traction suture and Hasson cannula introduced.  The gallbladder was not maximally dilated, but had some adhesions around it and was definitely thickened.  The upper 10 mm trocar was placed under direct vision and the two lateral 5 mm trocars were placed in the appropriate positions by Dr. Samuella Cota.  The gallbladder was then grasped, pulled upwards laterally.  In the more proximal portion of the gallbladder, you could see very thickened, sort of necrotic, and we picked the area where we thought was definitely gallbladder and possibly the cystic duct.  We then opened up the peritoneum very carefully and then dissected it down to the gallbladder wall.  We then dissected very carefully more proximally identifying the gallbladder tapering down.  There was a little cystic duct, probably a half inch maximum in length.  The little blood vessels around this were separated from the cystic duct and these were  clipped proximally and divided distally. Then I ws able to kind of free up circumferentially the cystic duct.  I then placed a clip at the neck of the gallbladder and then made a little nick into this, put in a Reddick catheter, slowly inflated the balloon, and placed a clip also, and then brought in the C-arm for the x-ray.  At first with the injection with the balloon inflated, the intrahepatic radicles filled first, but no filling of the distal common bile duct was noted.  I then deflated the balloon and the dye went on into the proximal common bile duct.   It was then noted that really the attachment where the gallbladder enters was into the right hepatic that is probably a centimeter from the junction of the right hepatic and left hepatic into the common bile duct.  There appeared to be a stricture of this little segment of the right hepatic right at its origin.  As far as the balloon, I tried to sort of work it to see if there was a stone impacted in; if it was I could not tell it, but I elected to go ahead an clip the little short cystic duct and then put an Endoloop just under the cystic duct.  I do not think there is any stricture of the junction from this Endoloop.  We then carefully dissected on the cystic artery.  It had already been doubly clipped proximally and singly clipped, but not divided.  Then I divided it.  Then we went ahead and freed up the gallbladder from its bed with good hemostasis.  Next, after the gallbladder was completely freed, then this distal portion was ___________ intrahepatic.  I then placed ir in an Endocatch bag, reinspected the liver bed which showed good hemostasis, and then brought out the gallbladder within the Endocatch bag at the umbilicus.  We reinspected, no evidence of any bleeding and the fascia at the umbilicus was closed with two figure-of-eights of Vicryl.  I anesthetized the fascia and the two lateral 5 mm trocars were withdrawn.  Then the carbon dioxide was released, and the upper 10 mm trocar withdrawn.  The subcutaneous wounds were irrigated and then closed with 4-0 Vicryl with benzoin and Steri-Strips on the skin.  The patient tolerated the procedure nicely and was extubated and carried to recovery room.  The postop condition I will obtain from an ERCP from Dr. Corinda Gubler and the GI people tomorrow. DD:  03/23/00 TD:  03/23/00 Job: [id number]

## 2011-09-19 LAB — POCT URINALYSIS DIP (DEVICE)
Nitrite: NEGATIVE
Operator id: 303351
Protein, ur: NEGATIVE
Urobilinogen, UA: 0.2
pH: 7

## 2011-10-23 DIAGNOSIS — D334 Benign neoplasm of spinal cord: Secondary | ICD-10-CM | POA: Insufficient documentation

## 2011-10-23 DIAGNOSIS — M545 Low back pain, unspecified: Secondary | ICD-10-CM | POA: Insufficient documentation

## 2011-12-19 ENCOUNTER — Emergency Department (HOSPITAL_COMMUNITY)
Admission: EM | Admit: 2011-12-19 | Discharge: 2011-12-19 | Disposition: A | Discharge status: Home / Self Care | Payer: Managed Care, Other (non HMO) | Attending: Emergency Medicine | Admitting: Emergency Medicine

## 2011-12-19 ENCOUNTER — Emergency Department (HOSPITAL_COMMUNITY): Payer: Managed Care, Other (non HMO)

## 2011-12-19 DIAGNOSIS — R519 Headache, unspecified: Secondary | ICD-10-CM

## 2011-12-19 DIAGNOSIS — R51 Headache: Secondary | ICD-10-CM | POA: Insufficient documentation

## 2011-12-19 HISTORY — DX: Essential (primary) hypertension: I10

## 2011-12-19 MED ORDER — OXYCODONE-ACETAMINOPHEN 5-325 MG PO TABS: 1.0000 | ORAL_TABLET | Freq: Four times a day (QID) | ORAL | Status: AC | PRN

## 2011-12-19 MED ORDER — OXYCODONE-ACETAMINOPHEN 5-325 MG PO TABS
1.0000 | ORAL_TABLET | Freq: Once | ORAL | Status: AC
  Administered 2011-12-19: 1 via ORAL
  Filled 2011-12-19: qty 1

## 2011-12-19 MED ORDER — NAPROXEN 500 MG PO TABS: 500.0000 mg | ORAL_TABLET | Freq: Two times a day (BID) | ORAL | Status: AC

## 2011-12-19 NOTE — ED Provider Notes (Signed)
History     CSN: [id number]  Arrival date & time 12/19/11  4098   First MD Initiated Contact with Patient 12/19/11 1103      Chief Complaint  Patient presents with  . Facial Pain    (Consider location/radiation/quality/duration/timing/severity/associated sxs/prior treatment) Patient is a 55 y.o. female presenting with headaches. The history is provided by the patient (The patient states she has pain in her right temple area that travels down her right cheek. This has been going on for a week.).  Headache  This is a recurrent problem. The current episode started more than 2 days ago. The problem occurs every few minutes. The problem has not changed since onset.The headache is associated with nothing. The pain is located in the temporal region. The pain is at a severity of 4/10. The pain is moderate. The pain radiates to the face. Pertinent negatives include no fever and no malaise/fatigue. She has tried nothing for the symptoms.    Past Medical History  Diagnosis Date  . Hypertension     Past Surgical History  Procedure Date  . Neck surgery     cadaver bones put in her neck    No family history on file.  History  Substance Use Topics  . Smoking status: Never Smoker   . Smokeless tobacco: Not on file  . Alcohol Use: No    OB History    Grav Para Term Preterm Abortions TAB SAB Ect Mult Living                  Review of Systems  Constitutional: Negative for fever, malaise/fatigue and fatigue.  HENT: Negative for congestion, sinus pressure and ear discharge.        Facial pain  Eyes: Negative for discharge.  Respiratory: Negative for cough.   Cardiovascular: Negative for chest pain.  Gastrointestinal: Negative for abdominal pain and diarrhea.  Genitourinary: Negative for frequency and hematuria.  Musculoskeletal: Negative for back pain.  Skin: Negative for rash.  Neurological: Positive for headaches. Negative for seizures.  Hematological: Negative.     Psychiatric/Behavioral: Negative for hallucinations.    Allergies  Sulfa antibiotics  Home Medications   Current Outpatient Rx  Name Route Sig Dispense Refill  . VITAMIN D-3 PO Oral Take 1 tablet by mouth daily.      Marland Kitchen DOXEPIN HCL 25 MG PO CAPS Oral Take 25 mg by mouth at bedtime as needed. For sleep     . HYDROCHLOROTHIAZIDE 25 MG PO TABS Oral Take 25 mg by mouth daily.      Marland Kitchen HYDROXYZINE HCL 25 MG PO TABS Oral Take 25 mg by mouth at bedtime as needed. For sleep     . IBUPROFEN 800 MG PO TABS Oral Take 800 mg by mouth every 8 (eight) hours as needed. For pain     . NAPROXEN 500 MG PO TABS Oral Take 1 tablet (500 mg total) by mouth 2 (two) times daily. 30 tablet 0  . OXYCODONE-ACETAMINOPHEN 5-325 MG PO TABS Oral Take 1 tablet by mouth every 6 (six) hours as needed for pain. 20 tablet 0    BP 137/69  Pulse 61  Temp(Src) 98.3 F (36.8 C) (Oral)  Resp 20  Ht 5\' 5"  (1.651 m)  Wt 200 lb (90.719 kg)  BMI 33.28 kg/m2  SpO2 100%  Physical Exam  Constitutional: She is oriented to person, place, and time. She appears well-developed.  HENT:  Head: Normocephalic and atraumatic.       tendernous to  right temporal and right cheek  Eyes: Conjunctivae and EOM are normal. No scleral icterus.  Neck: Neck supple. No thyromegaly present.  Cardiovascular: Normal rate and regular rhythm.  Exam reveals no gallop and no friction rub.   No murmur heard. Pulmonary/Chest: No stridor. She has no wheezes. She has no rales. She exhibits no tenderness.  Abdominal: She exhibits no distension. There is no tenderness. There is no rebound.  Musculoskeletal: Normal range of motion. She exhibits no edema.  Lymphadenopathy:    She has no cervical adenopathy.  Neurological: She is oriented to person, place, and time. No cranial nerve deficit. Coordination normal.  Skin: No rash noted. No erythema.  Psychiatric: She has a normal mood and affect. Her behavior is normal.    ED Course  Procedures (including  critical care time)  Labs Reviewed - No data to display Ct Head Wo Contrast  12/19/2011  *RADIOLOGY REPORT*  Clinical Data: Facial pain.  Headaches.  CT HEAD WITHOUT CONTRAST  Technique:  Contiguous axial images were obtained from the base of the skull through the vertex without contrast.  Comparison: CT head 04/07/2009 at [facility].  Findings: No acute intracranial abnormality is present. Specifically, there is no evidence for acute infarct, hemorrhage, mass, hydrocephalus, or extra-axial fluid collection.  The paranasal sinuses and mastoid air cells are clear.  The globes and orbits are intact.  The osseous skull is intact.  IMPRESSION: Negative CT of the head.  Original Report Authenticated By: Jamesetta Orleans. MATTERN, M.D.     1. Facial pain       MDM  Neurititis,          Benny Lennert, MD 12/19/11 1316

## 2011-12-19 NOTE — ED Notes (Signed)
Pt c/o "head pains," intermittent  R side head numbness, and R arm numbness x2 mons "or longer." Pt denies chest pain, sob, N/V, or sensitivity to light. 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

## 2011-12-19 NOTE — ED Notes (Signed)
Rt. Facial pain constant with numbness and a headache for over 2 months

## 2011-12-26 ENCOUNTER — Other Ambulatory Visit (HOSPITAL_COMMUNITY): Payer: Self-pay | Admitting: Obstetrics and Gynecology

## 2011-12-26 DIAGNOSIS — Z1231 Encounter for screening mammogram for malignant neoplasm of breast: Secondary | ICD-10-CM

## 2012-01-22 ENCOUNTER — Encounter (HOSPITAL_COMMUNITY): Payer: Self-pay | Admitting: Emergency Medicine

## 2012-01-22 ENCOUNTER — Emergency Department (HOSPITAL_COMMUNITY)
Admission: EM | Admit: 2012-01-22 | Discharge: 2012-01-22 | Disposition: A | Discharge status: Home / Self Care | Payer: Managed Care, Other (non HMO) | Source: Home / Self Care

## 2012-01-22 DIAGNOSIS — R197 Diarrhea, unspecified: Secondary | ICD-10-CM

## 2012-01-22 MED ORDER — ACETAMINOPHEN 325 MG PO TABS
650.0000 mg | ORAL_TABLET | Freq: Once | ORAL | Status: AC
  Administered 2012-01-22: 650 mg via ORAL

## 2012-01-22 MED ORDER — ACETAMINOPHEN 325 MG PO TABS
ORAL_TABLET | ORAL | Status: AC
  Filled 2012-01-22: qty 2

## 2012-01-22 NOTE — ED Provider Notes (Signed)
History     CSN: [id number]  Arrival date & time 01/22/12  1000   None     Chief Complaint  Patient presents with  . Abdominal Pain    (Consider location/radiation/quality/duration/timing/severity/associated sxs/prior treatment) HPI Comments: Pt reports onset of stomach pain and diarrhea 2 days ago. Stomach pain "is a growling, or knot in my stomach, like when you are hungry" - in the epigastric area. Diarrhea is watery, no blood, 3-4 times a day. No recent travel, antibiotics, or sick contacts. She began taking Pepto Bismol last night and has helped with her symptoms. She needs a note for work. No fever, chills, or vomiting. Has had some nausea and intermittent HA.    Past Medical History  Diagnosis Date  . Hypertension     Past Surgical History  Procedure Date  . Neck surgery     cadaver bones put in her neck  . Cholecystectomy   . Cesarean section     History reviewed. No pertinent family history.  History  Substance Use Topics  . Smoking status: Never Smoker   . Smokeless tobacco: Not on file  . Alcohol Use: No    OB History    Grav Para Term Preterm Abortions TAB SAB Ect Mult Living                  Review of Systems  Constitutional: Negative for fever and chills.  Respiratory: Negative for cough and shortness of breath.   Cardiovascular: Negative for chest pain.  Gastrointestinal: Positive for nausea, abdominal pain and diarrhea. Negative for vomiting and constipation.  Neurological: Positive for headaches.    Allergies  Sulfa antibiotics  Home Medications   Current Outpatient Rx  Name Route Sig Dispense Refill  . BISMUTH SUBSALICYLATE 262 MG/15ML PO SUSP Oral Take 15 mLs by mouth every 6 (six) hours as needed.    Marland Kitchen VITAMIN D-3 PO Oral Take 1 tablet by mouth daily.      Marland Kitchen HYDROCHLOROTHIAZIDE 25 MG PO TABS Oral Take 25 mg by mouth daily.      Marland Kitchen DOXEPIN HCL 25 MG PO CAPS Oral Take 25 mg by mouth at bedtime as needed. For sleep     . HYDROXYZINE HCL  25 MG PO TABS Oral Take 25 mg by mouth at bedtime as needed. For sleep     . IBUPROFEN 800 MG PO TABS Oral Take 800 mg by mouth every 8 (eight) hours as needed. For pain     . NAPROXEN 500 MG PO TABS Oral Take 1 tablet (500 mg total) by mouth 2 (two) times daily. 30 tablet 0    BP 146/87  Pulse 61  Temp(Src) 97.6 F (36.4 C) (Oral)  Resp 20  SpO2 99%  Physical Exam  Nursing note and vitals reviewed. Constitutional: She appears well-developed and well-nourished. No distress.  HENT:  Head: Normocephalic and atraumatic.  Right Ear: Tympanic membrane, external ear and ear canal normal.  Left Ear: Tympanic membrane, external ear and ear canal normal.  Nose: Nose normal.  Mouth/Throat: Uvula is midline, oropharynx is clear and moist and mucous membranes are normal. No oropharyngeal exudate, posterior oropharyngeal edema or posterior oropharyngeal erythema.  Neck: Neck supple.  Cardiovascular: Normal rate, regular rhythm and normal heart sounds.   Pulmonary/Chest: Effort normal and breath sounds normal. No respiratory distress.  Abdominal: Soft. Bowel sounds are normal. She exhibits no distension and no mass. There is no tenderness.  Lymphadenopathy:    She has no cervical adenopathy.  Neurological: She is alert.  Skin: Skin is warm and dry.  Psychiatric: She has a normal mood and affect.    ED Course  Procedures (including critical care time)  Labs Reviewed - No data to display No results found.   1. Acute diarrhea       MDM          Melody Comas, PA 01/22/12 1223

## 2012-01-22 NOTE — ED Notes (Signed)
Patient reports stomach gurgling, sounds hungry, but not hungry, diarrhea started 2 days ago.  Reports 3-4 diarrhea stools yesterday, 3 stools this am, no vomiting, but does feel nauseated.  C/o headache, general aches.

## 2012-01-22 NOTE — ED Notes (Signed)
Requested patient to place on gown, patient declined request.  Patient given pillow.

## 2012-01-22 NOTE — ED Notes (Signed)
C/o abdominal pain.  Onset 2 days ago.

## 2012-01-29 ENCOUNTER — Ambulatory Visit (HOSPITAL_COMMUNITY): Payer: Managed Care, Other (non HMO) | Attending: Obstetrics and Gynecology

## 2012-01-29 NOTE — ED Provider Notes (Signed)
Medical screening examination/treatment/procedure(s) were performed by non-physician practitioner and as supervising physician I was immediately available for consultation/collaboration.  LYKINS,KIMBERLY G  D.O.    Kimberly G Lykins, MD 01/29/12 0848 

## 2012-02-23 ENCOUNTER — Ambulatory Visit (HOSPITAL_COMMUNITY)
Admission: RE | Admit: 2012-02-23 | Discharge: 2012-02-23 | Disposition: A | Discharge status: Home / Self Care | Payer: Managed Care, Other (non HMO) | Source: Ambulatory Visit | Attending: Obstetrics and Gynecology | Admitting: Obstetrics and Gynecology

## 2012-02-23 DIAGNOSIS — Z1231 Encounter for screening mammogram for malignant neoplasm of breast: Secondary | ICD-10-CM

## 2012-09-14 DIAGNOSIS — Z981 Arthrodesis status: Secondary | ICD-10-CM | POA: Insufficient documentation

## 2012-12-22 DIAGNOSIS — R42 Dizziness and giddiness: Secondary | ICD-10-CM

## 2012-12-22 HISTORY — DX: Dizziness and giddiness: R42

## 2013-01-19 ENCOUNTER — Other Ambulatory Visit (HOSPITAL_COMMUNITY): Payer: Self-pay | Admitting: Internal Medicine

## 2013-01-23 ENCOUNTER — Emergency Department (HOSPITAL_COMMUNITY)
Admission: EM | Admit: 2013-01-23 | Discharge: 2013-01-23 | Disposition: A | Discharge status: Home / Self Care | Payer: Self-pay | Attending: Emergency Medicine | Admitting: Emergency Medicine

## 2013-01-23 ENCOUNTER — Encounter (HOSPITAL_COMMUNITY): Payer: Self-pay | Admitting: Physical Medicine and Rehabilitation

## 2013-01-23 DIAGNOSIS — Z79899 Other long term (current) drug therapy: Secondary | ICD-10-CM | POA: Insufficient documentation

## 2013-01-23 DIAGNOSIS — R209 Unspecified disturbances of skin sensation: Secondary | ICD-10-CM | POA: Insufficient documentation

## 2013-01-23 DIAGNOSIS — M545 Low back pain, unspecified: Secondary | ICD-10-CM | POA: Insufficient documentation

## 2013-01-23 DIAGNOSIS — R262 Difficulty in walking, not elsewhere classified: Secondary | ICD-10-CM | POA: Insufficient documentation

## 2013-01-23 DIAGNOSIS — I10 Essential (primary) hypertension: Secondary | ICD-10-CM | POA: Insufficient documentation

## 2013-01-23 DIAGNOSIS — D219 Benign neoplasm of connective and other soft tissue, unspecified: Secondary | ICD-10-CM | POA: Insufficient documentation

## 2013-01-23 DIAGNOSIS — E669 Obesity, unspecified: Secondary | ICD-10-CM | POA: Insufficient documentation

## 2013-01-23 MED ORDER — METHOCARBAMOL 500 MG PO TABS: 500.0000 mg | ORAL_TABLET | Freq: Two times a day (BID) | ORAL | Status: DC

## 2013-01-23 MED ORDER — DIAZEPAM 5 MG PO TABS
10.0000 mg | ORAL_TABLET | Freq: Once | ORAL | Status: AC
  Administered 2013-01-23: 10 mg via ORAL
  Filled 2013-01-23: qty 2

## 2013-01-23 MED ORDER — TRAMADOL HCL 50 MG PO TABS: 50.0000 mg | ORAL_TABLET | Freq: Four times a day (QID) | ORAL | Status: DC | PRN

## 2013-01-23 MED ORDER — OXYCODONE-ACETAMINOPHEN 5-325 MG PO TABS
1.0000 | ORAL_TABLET | Freq: Once | ORAL | Status: AC
  Administered 2013-01-23: 1 via ORAL
  Filled 2013-01-23: qty 1

## 2013-01-23 NOTE — ED Provider Notes (Signed)
History     CSN: [id number]  Arrival date & time 01/23/13  0829   First MD Initiated Contact with Patient 01/23/13 0920      Chief Complaint  Patient presents with  . 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 Andrey Campanile at [facility] is present with complaints of left leg pain. Pt reports for the past month she has had intermittent pain to her lower back which radiates down to her R hip and thigh.  Pain has been worsening for the past 3 days.  Described as sharp, aching, shooting, 10/10, worsening with walking or with movement. C/o tingling/numbness to R leg, and difficulty walking.  Denies fever, chills, rash, urinary or bowel incontinence, or saddle paresthesia.  No dysuria, hematuria. No specific treatment tried except occasional ibuprofen.  Denies unexplained weight loss, myalgias, night sweats. No recent trauma.    Past Medical History  Diagnosis Date  . Hypertension     Past Surgical History  Procedure Date  . Neck surgery     cadaver bones put in her neck  . Cholecystectomy   . Cesarean section     No family history on file.  History  Substance Use Topics  . Smoking status: Never Smoker   . Smokeless tobacco: Not on file  . Alcohol Use: No    OB History    Grav Para Term Preterm Abortions TAB SAB Ect Mult Living                  Review of Systems  Constitutional:       10 Systems reviewed and all are negative for acute change except as noted in the HPI.     Allergies  Sulfa antibiotics  Home Medications   Current Outpatient Rx  Name  Route  Sig  Dispense  Refill  . VITAMIN D-3 PO   Oral   Take 1 tablet by mouth daily.           Marland Kitchen HYDROCHLOROTHIAZIDE 25 MG PO TABS   Oral   Take 25 mg by mouth daily.           Marland Kitchen HYDROXYZINE HCL 25 MG PO TABS   Oral   Take 25 mg by mouth at bedtime  as needed. For sleep          . IBUPROFEN 800 MG PO TABS   Oral   Take 800 mg by mouth every 8 (eight) hours as needed. For pain            BP 148/74  Pulse 65  Temp 98.1 F (36.7 C) (Oral)  Resp 20  SpO2 100%  Physical Exam  Nursing note and vitals reviewed. Constitutional: She is oriented to person, place, and time. She appears well-developed and well-nourished.       Moderately obese  HENT:  Head: Atraumatic.  Eyes: Conjunctivae normal are normal.  Neck: Neck supple.  Cardiovascular: Normal rate, regular rhythm and intact distal pulses.   Pulmonary/Chest: Effort normal and breath sounds normal.  Abdominal: Soft. There is no tenderness.  Genitourinary:       Normal rectal tone  Chaperone present.  Musculoskeletal: She exhibits tenderness (Tenderness to right paralumbar region without overlying skin changes. Increased pain with back flexion and extension. No significant pain with hip flexion or extension. no  ffoot drop. Normal sensationn.. normal rectal tone. Unable to elicit patellar DTR). She exhibits no  edema.       BLE: no palpable cords, erythema, calf tenderness, leg swelling, neg homan's sign.  Neurological: She is alert and oriented to person, place, and time.  Skin: No rash noted.  Psychiatric: She has a normal mood and affect.    ED Course  Procedures (including critical care time)  Labs Reviewed - No data to display No results found.   No diagnosis found.  1. Acute on chronic low back pain.  Prior hx of nerve sheath tumor being managed by neurologist Dr. Andrey Campanile at [facility].  Pt does not have red flags finding.  Able to ambulate. Is NVI, no evidence suggestive of DVT.  Care discussed with my attending.   11:18 AM Patient felt much better after receiving pain medication. She is resting comfortably.  1. Low back pain MDM  BP 148/74  Pulse 65  Temp 98.1 F (36.7 C) (Oral)  Resp 20  SpO2 100%         Fayrene Helper, PA-C 01/23/13  1129

## 2013-01-23 NOTE — ED Notes (Signed)
Vilinda Blanks, PA with rectal examination on pt; pt tolerated well; pt is resting

## 2013-01-23 NOTE — ED Notes (Signed)
Walked with pt out of room and pt turned back into room and said that it was too painful for her to continue walking.

## 2013-01-23 NOTE — ED Provider Notes (Signed)
Medical screening examination/treatment/procedure(s) were performed by non-physician practitioner and as supervising physician I was immediately available for consultation/collaboration.   Dione Booze, MD 01/23/13 2253

## 2013-01-23 NOTE — ED Notes (Signed)
Pt presents to department for evaluation of R leg numbness/tingling and pain. Pt states symptoms ongoing for several months. States she has lower back tumor and is managed by Dr. Andrey Campanile at [facility]. Pt attempted to contact him, but states she could not get appointment and pain has become more severe. 9/10 pain beginning top of R leg and radiating down to foot. She is alert and oriented x4.

## 2013-02-14 ENCOUNTER — Encounter: Payer: Managed Care, Other (non HMO) | Admitting: Obstetrics & Gynecology

## 2013-03-03 ENCOUNTER — Ambulatory Visit (INDEPENDENT_AMBULATORY_CARE_PROVIDER_SITE_OTHER): Payer: Self-pay | Admitting: Obstetrics & Gynecology

## 2013-03-03 ENCOUNTER — Encounter: Payer: Self-pay | Admitting: Obstetrics & Gynecology

## 2013-03-03 VITALS — BP 130/81 | HR 80 | Temp 97.1°F | Ht 65.0 in | Wt 213.3 lb

## 2013-03-03 DIAGNOSIS — G8929 Other chronic pain: Secondary | ICD-10-CM | POA: Insufficient documentation

## 2013-03-03 DIAGNOSIS — N950 Postmenopausal bleeding: Secondary | ICD-10-CM

## 2013-03-03 DIAGNOSIS — N949 Unspecified condition associated with female genital organs and menstrual cycle: Secondary | ICD-10-CM

## 2013-03-03 DIAGNOSIS — D259 Leiomyoma of uterus, unspecified: Secondary | ICD-10-CM | POA: Insufficient documentation

## 2013-03-03 DIAGNOSIS — D219 Benign neoplasm of connective and other soft tissue, unspecified: Secondary | ICD-10-CM

## 2013-03-03 MED ORDER — TRAMADOL HCL 50 MG PO TABS: 50.0000 mg | ORAL_TABLET | Freq: Four times a day (QID) | ORAL | Status: DC | PRN

## 2013-03-03 NOTE — Progress Notes (Signed)
History:  57 y.o. G3P1020 here today for yearly exam and "to remove fibroids".  Reports being menopausal in 2008 but bleeding restarted in late 2013 and is erratic. No evaluation for this bleeding.  Refuses to do an endometrial biopsy in office.  Wants surgery to remove her fibroids, refuses to do a hysterectomy.  Last pap smear was about 2 years ago and was normal as per her report, denies any cervical dysplasia.  The following portions of the patient's history were reviewed and updated as appropriate: allergies, current medications, past family history, past medical history, past social history, past surgical history and problem list.  Review of Systems:  Pertinent items are noted in HPI.  Objective:  Physical Exam BP 130/81  Pulse 80  Temp(Src) 97.1 F (36.2 C)  Ht 5\' 5"  (1.651 m)  Wt 213 lb 4.8 oz (96.752 kg)  BMI 35.49 kg/m2  LMP 11/03/2012 Gen: NAD Rest of physical deferred as per patient's request  Assessment & Plan:  Patient declined annual exam and further evaluation of her fibroids and postmenopausal bleeding due to cost. She wants to wait until she gets financial assistance.  She was told about the concern for cancer, but she was insistent she wanted no procedure or evaluation done.  She wanted refill of Tramadol due to pelvic pain, this was given to her.  Bleeding and pain precautions were reviewed. She was given information about free pap smear screening clinics at [facility] and [place].

## 2013-03-03 NOTE — Patient Instructions (Signed)
You need a pap smear, endometrial biopsy, pelvic ultrasound   Postmenopausal Bleeding Menopause is commonly referred to as the "change in life." It is a time when the fertile years, the time of ovulating and having menstrual periods, has come to an end. It is also determined by not having menstrual periods for 12 months.  Postmenopausal bleeding is any bleeding a woman has after she has entered into menopause. Any type of postmenopausal bleeding, even if it appears to be a typical menstrual period, is concerning. This should be evaluated by your caregiver.  CAUSES   Hormone therapy.  Cancer of the cervix or cancer of the lining of the uterus (endometrial cancer).  Thinning of the uterine lining (uterine atrophy).  Thyroid diseases.  Certain medicines.  Infection of the uterus or cervix.  Inflammation or irritation of the uterine lining (endometritis).  Estrogen-secreting tumors.  Growths (polyps) on the cervix, uterine lining, or uterus.  Uterine tumors (fibroids).  Being very overweight (obese). DIAGNOSIS  Your caregiver will take a medical history and ask questions. A physical exam will also be performed. Further tests may include:   A transvaginal ultrasound. An ultrasound wand or probe is inserted into your vagina to view the pelvic organs.  A biopsy of the lining of the uterus (endometrium). A sample of the endometrium is removed and examined.  A hysteroscopy. Your caregiver may use an instrument with a light and a camera attached to it (hysteroscope). The hysteroscope is used to look inside the uterus for problems.  A dilation and curettage (D&C). Tissue is removed from the uterine lining to be examined for problems. TREATMENT  Treatment depends on the cause of the bleeding. Some treatments include:   Surgery.  Medicines.  Hormones.  A hysteroscopy or D&C to remove polyps or fibroids.  Changing or stopping a current medicine you are taking. Talk to your  caregiver about your specific treatment. HOME CARE INSTRUCTIONS   Maintain a healthy weight.  Keep regular pelvic exams and Pap tests. SEEK MEDICAL CARE IF:   You have bleeding, even if it is light in comparison to your previous periods.  Your bleeding lasts more than 1 week.  You have abdominal pain.  You develop bleeding with sexual intercourse. SEEK IMMEDIATE MEDICAL CARE IF:   You have a fever, chills, headache, dizziness, muscle aches, and bleeding.  You have severe pain with bleeding.  You are passing blood clots.  You have bleeding and need more than 1 pad an hour.  You feel faint. MAKE SURE YOU:  Understand these instructions.  Will watch your condition.  Will get help right away if you are not doing well or get worse. Document Released: 03/18/2006 Document Revised: 03/01/2012 Document Reviewed: 08/14/2011 [facility] Patient Information 2013 [place], [place].

## 2013-03-03 NOTE — Progress Notes (Signed)
States needs yearly exam and needs fibroids removed because they are causing her a lot of pain and discomfort.

## 2013-03-07 ENCOUNTER — Other Ambulatory Visit (HOSPITAL_COMMUNITY): Payer: Self-pay | Admitting: Internal Medicine

## 2013-03-07 DIAGNOSIS — Z1231 Encounter for screening mammogram for malignant neoplasm of breast: Secondary | ICD-10-CM

## 2013-03-16 ENCOUNTER — Other Ambulatory Visit: Payer: Self-pay | Admitting: Obstetrics & Gynecology

## 2013-03-17 ENCOUNTER — Ambulatory Visit (HOSPITAL_COMMUNITY): Payer: Self-pay

## 2013-03-24 ENCOUNTER — Ambulatory Visit (HOSPITAL_COMMUNITY)
Admission: RE | Admit: 2013-03-24 | Discharge: 2013-03-24 | Disposition: A | Discharge status: Home / Self Care | Payer: Self-pay | Source: Ambulatory Visit | Attending: Internal Medicine | Admitting: Internal Medicine

## 2013-03-24 DIAGNOSIS — Z1231 Encounter for screening mammogram for malignant neoplasm of breast: Secondary | ICD-10-CM

## 2013-04-04 ENCOUNTER — Other Ambulatory Visit: Payer: Self-pay | Admitting: Obstetrics & Gynecology

## 2013-04-04 DIAGNOSIS — D219 Benign neoplasm of connective and other soft tissue, unspecified: Secondary | ICD-10-CM

## 2013-04-27 ENCOUNTER — Encounter (HOSPITAL_COMMUNITY): Payer: Self-pay | Admitting: Emergency Medicine

## 2013-04-27 ENCOUNTER — Emergency Department (HOSPITAL_COMMUNITY)
Admission: EM | Admit: 2013-04-27 | Discharge: 2013-04-27 | Disposition: A | Discharge status: Home / Self Care | Payer: Self-pay | Attending: Emergency Medicine | Admitting: Emergency Medicine

## 2013-04-27 DIAGNOSIS — M549 Dorsalgia, unspecified: Secondary | ICD-10-CM | POA: Insufficient documentation

## 2013-04-27 DIAGNOSIS — G8929 Other chronic pain: Secondary | ICD-10-CM | POA: Insufficient documentation

## 2013-04-27 DIAGNOSIS — M545 Low back pain, unspecified: Secondary | ICD-10-CM | POA: Insufficient documentation

## 2013-04-27 DIAGNOSIS — I10 Essential (primary) hypertension: Secondary | ICD-10-CM | POA: Insufficient documentation

## 2013-04-27 DIAGNOSIS — Z8742 Personal history of other diseases of the female genital tract: Secondary | ICD-10-CM | POA: Insufficient documentation

## 2013-04-27 DIAGNOSIS — R197 Diarrhea, unspecified: Secondary | ICD-10-CM | POA: Insufficient documentation

## 2013-04-27 DIAGNOSIS — Z79899 Other long term (current) drug therapy: Secondary | ICD-10-CM | POA: Insufficient documentation

## 2013-04-27 DIAGNOSIS — R209 Unspecified disturbances of skin sensation: Secondary | ICD-10-CM | POA: Insufficient documentation

## 2013-04-27 MED ORDER — HYDROCODONE-ACETAMINOPHEN 5-325 MG PO TABS: 1.0000 | ORAL_TABLET | Freq: Four times a day (QID) | ORAL | Status: DC | PRN

## 2013-04-27 MED ORDER — KETOROLAC TROMETHAMINE 60 MG/2ML IM SOLN
60.0000 mg | Freq: Once | INTRAMUSCULAR | Status: AC
  Administered 2013-04-27: 60 mg via INTRAMUSCULAR
  Filled 2013-04-27: qty 2

## 2013-04-27 MED ORDER — PREDNISONE 20 MG PO TABS: 40.0000 mg | ORAL_TABLET | Freq: Every day | ORAL | Status: DC

## 2013-04-27 NOTE — ED Provider Notes (Signed)
Medical screening examination/treatment/procedure(s) were performed by non-physician practitioner and as supervising physician I was immediately available for consultation/collaboration.    Johny Pitstick D Nasha Diss, MD 04/27/13 2338 

## 2013-04-27 NOTE — ED Provider Notes (Signed)
History    This chart was scribed for non-physician practitioner working with Vida Roller, MD by Smitty Pluck, ED scribe. This patient was seen in room TR08C/TR08C and the patient's care was started at 6:14 PM.   CSN: [id number]  Arrival date & time 04/27/13  1737   Chief Complaint  Patient presents with  . Back Pain    The history is provided by medical records and the patient. No language interpreter was used.   Tasha [name] is a 57 y.o. female who presents to the Emergency Department with chief complaint of constant, moderate bilateral leg pain onset 2-3 months ago worsening 1 month ago. She states she had intermittent numbness in bilateral feet 1 month ago but symptoms subsided. She reports that lying on hips, walking and bearing weight aggravates the pain. She states she has had diarrhea onset 1 day ago. She states she has taken tramadol, hydrocodone, ibuprofen and maloxicam without relief. She has taken prednisone with relief. 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 She states she has an appointment with a neurologist in 5 days. Pt denies urinary incontinence, bowel incontinence, fever, chills, nausea, vomiting, weakness, cough, SOB and any other pain.    Neurologist is Dr. Andrey Campanile at [facility]   Past Medical History  Diagnosis Date  . Hypertension   . Fibroids   . Back pain, chronic   . Tumor associated pain     Past Surgical History  Procedure Laterality Date  . Neck surgery      cadaver bones put in her neck  . Cholecystectomy    . Cesarean section      Family History  Problem Relation Age of Onset  . Hypertension Mother   . Cancer Mother     History  Substance Use Topics  . Smoking status: Never Smoker   . Smokeless tobacco: Never Used  . Alcohol Use: No    OB History   Grav Para Term Preterm Abortions TAB SAB Ect Mult Living   3 1 1  2  2          Review of Systems 10 Systems reviewed and all are negative for acute change except as  noted in the HPI.   Allergies  Sulfa antibiotics  Home Medications   Current Outpatient Rx  Name  Route  Sig  Dispense  Refill  . Cholecalciferol (VITAMIN D-3 PO)   Oral   Take 1 tablet by mouth daily.           . hydrochlorothiazide (HYDRODIURIL) 25 MG tablet   Oral   Take 25 mg by mouth daily.           . hydrOXYzine (ATARAX/VISTARIL) 25 MG tablet   Oral   Take 25 mg by mouth at bedtime as needed. For sleep          . ibuprofen (ADVIL,MOTRIN) 800 MG tablet   Oral   Take 800 mg by mouth every 8 (eight) hours as needed. For pain          . methocarbamol (ROBAXIN) 500 MG tablet   Oral   Take 1 tablet (500 mg total) by mouth 2 (two) times daily.   20 tablet   0   . traMADol (ULTRAM) 50 MG tablet      TAKE 1 TABLET BY MOUTH EVERY 6 HOURS AS NEEDED FOR PAIN   30 tablet   3     BP 137/57  Pulse 80  Temp(Src)  98.4 F (36.9 C) (Oral)  Resp 14  SpO2 97%  Physical Exam  Nursing note and vitals reviewed. Constitutional: She is oriented to person, place, and time. She appears well-developed and well-nourished. No distress.  HENT:  Head: Normocephalic and atraumatic.  Eyes: EOM are normal.  Neck: Neck supple. No tracheal deviation present.  Cardiovascular: Normal rate.   Pulmonary/Chest: Effort normal. No respiratory distress.  Musculoskeletal: Normal range of motion.  No lumbar spine tenderness Lumbar paraspinal muscles were tight but nontender top palpation Lower extremity ROM and strength 5/5 bilaterally   Neurological: She is alert and oriented to person, place, and time.  Sensation and strength intact bilaterally    Skin: Skin is warm and dry.  Psychiatric: She has a normal mood and affect. Her behavior is normal.    ED Course  Procedures (including critical care time) DIAGNOSTIC STUDIES: Oxygen Saturation is 97% on room air, normal by my interpretation.    COORDINATION OF CARE: 6:23 PM Discussed ED treatment with pt and pt agrees.      Labs Reviewed - No data to display No results found.   1. Back pain       MDM  Patient with back pain.  No neurological deficits and normal neuro exam.  Patient can walk but states is painful.  No loss of bowel or bladder control.  No concern for cauda equina.  No fever, night sweats, weight loss, h/o cancer, IVDU.  RICE protocol and pain medicine indicated and discussed with patient.        I personally performed the services described in this documentation, which was scribed in my presence. The recorded information has been reviewed and is accurate.     Roxy Horseman, PA-C 04/27/13 2039

## 2013-04-27 NOTE — ED Notes (Signed)
Pt reports "tumor" on spine x 3 years.  States she usually goes to [facility] to see a specialist and was told if she ever started having pain in legs to go to ED.  States she is having lower back pain and pain in bilateral legs x 2-3 months. Also reports numbness and tingling to bilateral legs and 2- 3 weeks.

## 2013-04-29 ENCOUNTER — Telehealth (HOSPITAL_COMMUNITY): Payer: Self-pay | Admitting: Emergency Medicine
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
## 2013-05-10 ENCOUNTER — Encounter (HOSPITAL_COMMUNITY): Payer: Self-pay | Admitting: Emergency Medicine

## 2013-05-10 ENCOUNTER — Emergency Department (HOSPITAL_COMMUNITY): Payer: BC Managed Care – PPO

## 2013-05-10 ENCOUNTER — Emergency Department (HOSPITAL_COMMUNITY)
Admission: EM | Admit: 2013-05-10 | Discharge: 2013-05-10 | Disposition: A | Discharge status: Home / Self Care | Payer: BC Managed Care – PPO | Attending: Emergency Medicine | Admitting: Emergency Medicine

## 2013-05-10 DIAGNOSIS — Z79899 Other long term (current) drug therapy: Secondary | ICD-10-CM | POA: Insufficient documentation

## 2013-05-10 DIAGNOSIS — M545 Low back pain, unspecified: Secondary | ICD-10-CM | POA: Insufficient documentation

## 2013-05-10 DIAGNOSIS — IMO0002 Reserved for concepts with insufficient information to code with codable children: Secondary | ICD-10-CM | POA: Insufficient documentation

## 2013-05-10 DIAGNOSIS — R071 Chest pain on breathing: Secondary | ICD-10-CM | POA: Insufficient documentation

## 2013-05-10 DIAGNOSIS — M549 Dorsalgia, unspecified: Secondary | ICD-10-CM

## 2013-05-10 DIAGNOSIS — R109 Unspecified abdominal pain: Secondary | ICD-10-CM | POA: Insufficient documentation

## 2013-05-10 DIAGNOSIS — D219 Benign neoplasm of connective and other soft tissue, unspecified: Secondary | ICD-10-CM

## 2013-05-10 DIAGNOSIS — R0789 Other chest pain: Secondary | ICD-10-CM

## 2013-05-10 DIAGNOSIS — G893 Neoplasm related pain (acute) (chronic): Secondary | ICD-10-CM | POA: Insufficient documentation

## 2013-05-10 DIAGNOSIS — G8929 Other chronic pain: Secondary | ICD-10-CM | POA: Insufficient documentation

## 2013-05-10 DIAGNOSIS — I10 Essential (primary) hypertension: Secondary | ICD-10-CM | POA: Insufficient documentation

## 2013-05-10 LAB — URINALYSIS, ROUTINE W REFLEX MICROSCOPIC
Bilirubin Urine: NEGATIVE
Glucose, UA: NEGATIVE mg/dL
Hgb urine dipstick: NEGATIVE
Ketones, ur: NEGATIVE mg/dL
Leukocytes, UA: NEGATIVE
Protein, ur: NEGATIVE mg/dL
pH: 6.5 (ref 5.0–8.0)

## 2013-05-10 LAB — COMPREHENSIVE METABOLIC PANEL
AST: 13 U/L (ref 0–37)
BUN: 13 mg/dL (ref 6–23)
CO2: 26 mEq/L (ref 19–32)
Calcium: 10.1 mg/dL (ref 8.4–10.5)
Chloride: 106 mEq/L (ref 96–112)
Creatinine, Ser: 0.59 mg/dL (ref 0.50–1.10)
GFR calc Af Amer: >90 mL/min (ref >90)
GFR calc non Af Amer: >90 mL/min (ref >90)
Glucose, Bld: 109 mg/dL — ABNORMAL HIGH (ref 70–99)
Total Bilirubin: 0.3 mg/dL (ref 0.3–1.2)

## 2013-05-10 LAB — CBC
Hemoglobin: 12.9 g/dL (ref 12.0–15.0)
MCH: 29 pg (ref 26.0–34.0)
MCHC: 33.8 g/dL (ref 30.0–36.0)
MCV: 85.8 fL (ref 78.0–100.0)
RBC: 4.45 MIL/uL (ref 3.87–5.11)

## 2013-05-10 LAB — POCT I-STAT TROPONIN I: Troponin i, poc: 0.02 ng/mL (ref 0.00–0.08)

## 2013-05-10 MED ORDER — KETOROLAC TROMETHAMINE 30 MG/ML IJ SOLN
30.0000 mg | Freq: Once | INTRAMUSCULAR | Status: AC
  Administered 2013-05-10: 30 mg via INTRAVENOUS
  Filled 2013-05-10: qty 1

## 2013-05-10 NOTE — ED Provider Notes (Signed)
Medical screening examination/treatment/procedure(s) were performed by non-physician practitioner and as supervising physician I was immediately available for consultation/collaboration.  Dezi Schaner, MD 05/10/13 1444 

## 2013-05-10 NOTE — ED Notes (Signed)
Pt c/o of right sided lower back pain that radiates down the side of leg. States that she sees a neurologist that states that it is not nerve pain. States "I think that this is pain that is coming from fibroids". NAD at this time.

## 2013-05-10 NOTE — ED Provider Notes (Signed)
History     CSN: [id number]  Arrival date & time 05/10/13  4098   First MD Initiated Contact with Patient 05/10/13 1017      Chief Complaint  Patient presents with  . Back Pain  . Flank Pain    (Consider location/radiation/quality/duration/timing/severity/associated sxs/prior treatment) HPI Comments: 57 y/o female with a PMHx of HTN, fibroids and chronic back pain presents to the ED with multiple complaints. States she has chronic right side low back pain from uterine fibroids "pressing on her kidney" causing radiation down her right leg. Pain has been worsening over the past couple days described as sharp, 10/10, constant. Has not seen her gynecologist since "he is no good" and is trying to get a new one. Saw her neurologist at [facility] who follows her tumor near her spine a few weeks back who gave her a steroid and "pain killers" for "muscle spasm" without relief. She had an MRI which showed no changes in the size of her tumor, and neurologist did not feel her pain was from this. She then went to [facility] ED on 5/7 and was prescribed vicodin which does not help her pain. States "no one can help my pain, you all ask the same questions and this is not a muscle spasm, take my fibroids out today". Laying flat makes the pain worse. Denies urinary changes, bowel changes, vaginal complaints, nausea or vomiting. Also complaining of left sided chest pain x 1 week radiating around her breast described as sharp, constant, 10/10. Nothing in specific makes the pain worse or better. Denies ever having pain like this in the past. Denies shortness of breath. Unsure of family history of heart problems.   Patient is a 57 y.o. female presenting with back pain and flank pain. The history is provided by the patient.  Back Pain Associated symptoms: chest pain   Associated symptoms: no dysuria, no fever and no headaches   Flank Pain Associated symptoms include chest pain. Pertinent negatives include no chills,  coughing, diaphoresis, fever, headaches, nausea or vomiting.    Past Medical History  Diagnosis Date  . Hypertension   . Fibroids   . Back pain, chronic   . Tumor associated pain     Past Surgical History  Procedure Laterality Date  . Neck surgery      cadaver bones put in her neck  . Cholecystectomy    . Cesarean section      Family History  Problem Relation Age of Onset  . Hypertension Mother   . Cancer Mother     History  Substance Use Topics  . Smoking status: Never Smoker   . Smokeless tobacco: Never Used  . Alcohol Use: No    OB History   Grav Para Term Preterm Abortions TAB SAB Ect Mult Living   3 1 1  2  2          Review of Systems  Constitutional: Negative for fever, chills and diaphoresis.  Respiratory: Negative for cough and shortness of breath.   Cardiovascular: Positive for chest pain.  Gastrointestinal: Negative for nausea and vomiting.  Genitourinary: Positive for flank pain. Negative for dysuria, urgency, hematuria, vaginal bleeding and vaginal discharge.  Musculoskeletal: Positive for back pain.  Neurological: Negative for dizziness, light-headedness and headaches.  All other systems reviewed and are negative.    Allergies  Sulfa antibiotics  Home Medications   Current Outpatient Rx  Name  Route  Sig  Dispense  Refill  . hydrochlorothiazide (HYDRODIURIL) 25 MG tablet  Oral   Take 25 mg by mouth daily.           Marland Kitchen HYDROcodone-acetaminophen (NORCO/VICODIN) 5-325 MG per tablet   Oral   Take 1 tablet by mouth every 6 (six) hours as needed for pain.   6 tablet   0   . ibuprofen (ADVIL,MOTRIN) 800 MG tablet   Oral   Take 800 mg by mouth every 8 (eight) hours as needed. For pain          . predniSONE (DELTASONE) 20 MG tablet   Oral   Take 2 tablets (40 mg total) by mouth daily. Take 40 mg by mouth daily for 3 days, then 20mg  by mouth daily for 3 days, then 10mg  daily for 3 days   12 tablet   0   . traMADol (ULTRAM) 50 MG  tablet      TAKE 1 TABLET BY MOUTH EVERY 6 HOURS AS NEEDED FOR PAIN   30 tablet   3     BP 162/68  Pulse 72  Temp(Src) 97.6 F (36.4 C)  Resp 18  SpO2 100%  Physical Exam  Nursing note and vitals reviewed. Constitutional: She is oriented to person, place, and time. She appears well-developed and well-nourished. No distress.  Sitting on exam bed using her laptop on her lap until examination started.  HENT:  Head: Normocephalic and atraumatic.  Mouth/Throat: Oropharynx is clear and moist.  Eyes: Conjunctivae and EOM are normal. Pupils are equal, round, and reactive to light.  Neck: Normal range of motion. Neck supple.  Cardiovascular: Normal rate, regular rhythm, normal heart sounds and intact distal pulses.   No extremity edema.  Pulmonary/Chest: Effort normal and breath sounds normal. She has no decreased breath sounds. She has no wheezes. She has no rhonchi. She has no rales. She exhibits tenderness.    Abdominal: Normal appearance and bowel sounds are normal. She exhibits no mass. There is tenderness. There is no rigidity, no rebound, no guarding, no CVA tenderness and negative Murphy's sign.    Musculoskeletal: Normal range of motion. She exhibits no edema.       Back:  Neurological: She is alert and oriented to person, place, and time. She has normal strength. No sensory deficit.  Skin: Skin is warm and dry. She is not diaphoretic.  Psychiatric: Her speech is normal. Her affect is blunt. She is agitated.  Poor eye contact.    ED Course  Procedures (including critical care time)  Labs Reviewed  CBC  URINALYSIS, ROUTINE W REFLEX MICROSCOPIC  COMPREHENSIVE METABOLIC PANEL    Date: 05/10/2013  Rate: 74  Rhythm: normal sinus rhythm  QRS Axis: normal  Intervals: normal  ST/T Wave abnormalities: normal  Conduction Disutrbances:borderline AV conduction delay  Narrative Interpretation: no stemi  Old EKG Reviewed: unchanged   Dg Chest 2 View  05/10/2013    *RADIOLOGY REPORT*  Clinical Data: Intermittent right side chest pain for 1 month, history hypertension and smoking  CHEST - 2 VIEW  Comparison: 06/18/2009  Findings: Normal heart size, mediastinal contours, and pulmonary vascularity. Lungs clear. No pleural effusion or pneumothorax. Prior cervical spine fusion.  IMPRESSION: No acute abnormalities.   Original Report Authenticated By: Ulyses Southward, M.D.     1. Back pain   2. Chest wall pain   3. Fibroids       MDM  57 y/o female with known uterine fibroids complaining of right sided pain radiating down leg. Upon first entering room, she was laying comfortably on bed  using lap top. As soon as I began asking questions, she put laptop down, became agitated saying to look in the computer for her information, and started groaning in pain. I will obtain labs, urine, give toradol. Chest pain reproducible on exam. I do not feel her chest pain is cardiac or pulmonary related.  12:06 PM Patient reports improvement in pain with toradol. She is sitting up on exam bed on laptop in NAD. CXR unremarkable. Troponin negative. U/A WNL.  12:34 PM Labs unremarkable. Patient standing at room door asking if she can leave. Patient is stable for discharge. She will follow up with gynecology regarding her fibroids. Return precautions discussed. Patient states understanding of plan and is agreeable.   Trevor Mace, PA-C 05/10/13 1235

## 2013-05-22 ENCOUNTER — Emergency Department (HOSPITAL_COMMUNITY)
Admission: EM | Admit: 2013-05-22 | Discharge: 2013-05-22 | Disposition: A | Discharge status: Home / Self Care | Payer: BC Managed Care – PPO | Attending: Emergency Medicine | Admitting: Emergency Medicine

## 2013-05-22 ENCOUNTER — Emergency Department (HOSPITAL_COMMUNITY): Payer: BC Managed Care – PPO

## 2013-05-22 ENCOUNTER — Encounter (HOSPITAL_COMMUNITY): Payer: Self-pay | Admitting: *Deleted

## 2013-05-22 DIAGNOSIS — M545 Low back pain, unspecified: Secondary | ICD-10-CM | POA: Insufficient documentation

## 2013-05-22 DIAGNOSIS — I10 Essential (primary) hypertension: Secondary | ICD-10-CM | POA: Insufficient documentation

## 2013-05-22 DIAGNOSIS — Z8742 Personal history of other diseases of the female genital tract: Secondary | ICD-10-CM | POA: Insufficient documentation

## 2013-05-22 DIAGNOSIS — M5431 Sciatica, right side: Secondary | ICD-10-CM

## 2013-05-22 DIAGNOSIS — M5430 Sciatica, unspecified side: Secondary | ICD-10-CM | POA: Insufficient documentation

## 2013-05-22 DIAGNOSIS — Z79899 Other long term (current) drug therapy: Secondary | ICD-10-CM | POA: Insufficient documentation

## 2013-05-22 MED ORDER — HYDROMORPHONE HCL PF 1 MG/ML IJ SOLN
1.0000 mg | Freq: Once | INTRAMUSCULAR | Status: AC
  Administered 2013-05-22: 1 mg via INTRAVENOUS
  Filled 2013-05-22: qty 1

## 2013-05-22 MED ORDER — OXYCODONE-ACETAMINOPHEN 5-325 MG PO TABS: 1.0000 | ORAL_TABLET | Freq: Four times a day (QID) | ORAL | Status: DC | PRN

## 2013-05-22 MED ORDER — ONDANSETRON HCL 4 MG/2ML IJ SOLN
4.0000 mg | Freq: Once | INTRAMUSCULAR | Status: AC
  Administered 2013-05-22: 4 mg via INTRAVENOUS
  Filled 2013-05-22: qty 2

## 2013-05-22 NOTE — ED Provider Notes (Signed)
History     CSN: [id number]  Arrival date & time 05/22/13  0327   First MD Initiated Contact with Patient 05/22/13 0335      Chief Complaint  Patient presents with  . Hip Pain    (Consider location/radiation/quality/duration/timing/severity/associated sxs/prior treatment) HPI History provided by the patient. Right low back pain radiates down right leg on off for last 3 months. 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 She has some associated numbness but no weakness. She has had MRIs for the symptoms and taken multiple liver prescription medications she states did not seem to help. No incontinence. No fevers. No recalled trauma. Pain is sharp in quality and moderate to severe, worse with movement. No known alleviating factors.  Past Medical History  Diagnosis Date  . Hypertension   . Fibroids   . Back pain, chronic   . Tumor associated pain     Past Surgical History  Procedure Laterality Date  . Neck surgery      cadaver bones put in her neck  . Cholecystectomy    . Cesarean section      Family History  Problem Relation Age of Onset  . Hypertension Mother   . Cancer Mother     History  Substance Use Topics  . Smoking status: Never Smoker   . Smokeless tobacco: Never Used  . Alcohol Use: No    OB History   Grav Para Term Preterm Abortions TAB SAB Ect Mult Living   3 1 1  2  2          Review of Systems  Constitutional: Negative for fever and chills.  HENT: Negative for neck pain and neck stiffness.   Eyes: Negative for pain.  Respiratory: Negative for shortness of breath.   Cardiovascular: Negative for chest pain.  Gastrointestinal: Negative for abdominal pain.  Genitourinary: Negative for dysuria.  Musculoskeletal: Positive for back pain.  Skin: Negative for rash.  Neurological: Negative for headaches.  All other systems reviewed and are negative.    Allergies  Sulfa antibiotics  Home Medications    Current Outpatient Rx  Name  Route  Sig  Dispense  Refill  . hydrochlorothiazide (HYDRODIURIL) 25 MG tablet   Oral   Take 25 mg by mouth daily.          Marland Kitchen HYDROcodone-acetaminophen (NORCO/VICODIN) 5-325 MG per tablet   Oral   Take 1 tablet by mouth every 6 (six) hours as needed for pain.         Marland Kitchen ibuprofen (ADVIL,MOTRIN) 800 MG tablet   Oral   Take 800 mg by mouth every 8 (eight) hours as needed. For pain          . meloxicam (MOBIC) 15 MG tablet   Oral   Take 15 mg by mouth daily.         . Multiple Vitamin (MULTIVITAMIN WITH MINERALS) TABS   Oral   Take 1 tablet by mouth daily.         . traMADol (ULTRAM) 50 MG tablet   Oral   Take 50 mg by mouth every 6 (six) hours as needed for pain.         Marland Kitchen oxyCODONE-acetaminophen (PERCOCET/ROXICET) 5-325 MG per tablet   Oral   Take 1 tablet by mouth every 6 (six) hours as needed for pain.   6 tablet   0     BP 134/108  Pulse 98  Temp(Src) 97.7 F (36.5  C) (Oral)  Resp 22  SpO2 100%  Physical Exam  Constitutional: She is oriented to person, place, and time. She appears well-developed and well-nourished.  HENT:  Head: Normocephalic and atraumatic.  Eyes: EOM are normal. Pupils are equal, round, and reactive to light.  Neck: Neck supple.  Cardiovascular: Regular rhythm and intact distal pulses.   Pulmonary/Chest: Effort normal. No respiratory distress.  Musculoskeletal: Normal range of motion. She exhibits no edema.  Reproducible symptoms with palpation over the right sciatic area. No lower extremity deficits with equal strengths, sensorium to light touch and DTRs. No midline lumbar tenderness or deformity  Neurological: She is alert and oriented to person, place, and time.  Skin: Skin is warm and dry.    ED Course  Procedures (including critical care time)  Labs Reviewed - No data to display Dg Lumbar Spine Complete  05/22/2013   *RADIOLOGY REPORT*  Clinical Data: Back pain  LUMBAR SPINE - COMPLETE 4+  VIEW  Comparison: 08/19/2009 MRI  Findings: Surgical clips right upper quadrant. Mild multilevel degenerative change with anterior osteophyte formation. Mild T11-12 disc height loss and associated endplate irregularity has developed since 2010.  Otherwise, the imaged vertebral bodies and inter- vertebral disc spaces are maintained. No displaced acute fracture or dislocation identified.   The para-vertebral and overlying soft tissues are within normal limits.  Atherosclerotic vascular calcifications.  IMPRESSION: Mild degenerative changes, most pronounced at T11-12.  No acute osseous finding.   Original Report Authenticated By: Jearld Lesch, M.D.      1. Sciatica, right     IV Dilaudid 6:33 AM on recheck has significant improvement of symptoms. Patient requesting to be discharged home agrees to close primary care followup. Strict return precautions verbalized as understood.  MDM  Right-sided low back pain. No features of presentation to suggest indication for emergent MRI at this time. Improved with IV narcotics. X-ray reviewed as above. All signs and nursing notes reviewed and considered.        Sunnie Nielsen, MD 05/22/13 [phone]

## 2013-05-22 NOTE — ED Notes (Signed)
Pt is here with right lower back pain that extends down right leg that she has had for about 3 months and has been treated by PMD with pain medications and anti-inflammatories

## 2013-05-25 ENCOUNTER — Encounter: Payer: Self-pay | Admitting: Obstetrics & Gynecology

## 2013-06-22 ENCOUNTER — Ambulatory Visit (INDEPENDENT_AMBULATORY_CARE_PROVIDER_SITE_OTHER): Payer: BLUE CROSS/BLUE SHIELD | Admitting: Obstetrics & Gynecology

## 2013-06-22 ENCOUNTER — Encounter: Payer: Self-pay | Admitting: Obstetrics & Gynecology

## 2013-06-22 VITALS — BP 137/74 | HR 80 | Temp 97.9°F | Ht 65.0 in | Wt 207.0 lb

## 2013-06-22 DIAGNOSIS — Z01419 Encounter for gynecological examination (general) (routine) without abnormal findings: Secondary | ICD-10-CM

## 2013-06-22 DIAGNOSIS — N949 Unspecified condition associated with female genital organs and menstrual cycle: Secondary | ICD-10-CM

## 2013-06-22 DIAGNOSIS — G8929 Other chronic pain: Secondary | ICD-10-CM

## 2013-06-22 DIAGNOSIS — D259 Leiomyoma of uterus, unspecified: Secondary | ICD-10-CM

## 2013-06-22 DIAGNOSIS — D219 Benign neoplasm of connective and other soft tissue, unspecified: Secondary | ICD-10-CM

## 2013-06-22 LAB — POCT URINALYSIS DIPSTICK
Glucose, UA: NEGATIVE
Ketones, UA: NEGATIVE
Leukocytes, UA: NEGATIVE
Protein, UA: NEGATIVE
Spec Grav, UA: 1.015

## 2013-06-22 NOTE — Patient Instructions (Signed)
Hysterectomy Information  A hysterectomy is a procedure where your uterus is surgically removed. It will no longer be possible to have menstrual periods or to become pregnant. The tubes and ovaries can be removed (bilateral salpingo-oopherectomy) during this surgery as well.  REASONS FOR A HYSTERECTOMY  Persistent, abnormal bleeding.  Lasting (chronic) pelvic pain or infection.  The lining of the uterus (endometrium) starts growing outside the uterus (endometriosis).  The endometrium starts growing in the muscle of the uterus (adenomyosis).  The uterus falls down into the vagina (pelvic organ prolapse).  Symptomatic uterine fibroids.  Precancerous cells.  Cervical cancer or uterine cancer. TYPES OF HYSTERECTOMIES  Supracervical hysterectomy. This type removes the top part of the uterus, but not the cervix.  Total hysterectomy. This type removes the uterus and cervix.  Radical hysterectomy. This type removes the uterus, cervix, and the fibrous tissue that holds the uterus in place in the pelvis (parametrium). WAYS A HYSTERECTOMY CAN BE PERFORMED  Abdominal hysterectomy. A large surgical cut (incision) is made in the abdomen. The uterus is removed through this incision.  Vaginal hysterectomy. An incision is made in the vagina. The uterus is removed through this incision. There are no abdominal incisions.  Conventional laparoscopic hysterectomy. A thin, lighted tube with a camera (laparoscope) is inserted into 3 or 4 small incisions in the abdomen. The uterus is cut into small pieces. The small pieces are removed through the incisions, or they are removed through the vagina.  Laparoscopic assisted vaginal hysterectomy (LAVH). Three or four small incisions are made in the abdomen. Part of the surgery is performed laparoscopically and part vaginally. The uterus is removed through the vagina.  Robot-assisted laparoscopic hysterectomy. A laparoscope is inserted into 3 or 4 small  incisions in the abdomen. A computer-controlled device is used to give the surgeon a 3D image. This allows for more precise movements of surgical instruments. The uterus is cut into small pieces and removed through the incisions or removed through the vagina. RISKS OF HYSTERECTOMY   Bleeding and risk of blood transfusion. Tell your caregiver if you do not want to receive any blood products.  Blood clots in the legs or lung.  Infection.  Injury to surrounding organs.  Anesthesia problems or side effects.  Conversion to an abdominal hysterectomy. WHAT TO EXPECT AFTER A HYSTERECTOMY  You will be given pain medicine.  You will need to have someone with you for the first 3 to 5 days after you go home.  You will need to follow up with your surgeon in 2 to 4 weeks after surgery to evaluate your progress.  You may have early menopause symptoms like hot flashes, night sweats, and insomnia.  If you had a hysterectomy for a problem that was not a cancer or a condition that could lead to cancer, then you no longer need Pap tests. However, even if you no longer need a Pap test, a regular exam is a good idea to make sure no other problems are starting. Document Released: 06/03/2001 Document Revised: 03/01/2012 Document Reviewed: 07/19/2011 [employer] Patient Information 2014 [employer].  

## 2013-06-22 NOTE — Progress Notes (Signed)
Subjective:     Tasha [name] is a 57 y.o. female here for a routine exam.  Current complaints: Patient is in office for consult to discuss fibroid management.Patient reports she has had fibroids for years- patient thinks she has back pain and leg pain. Has been evaluated by Neurosurgery. Personal health questionnaire reviewed: no. Menopause--late 40s, early 47s; episodic bleeding since 2008.  She reports having undergone an endometrial biopsy several years ago. Gynecologic History No LMP recorded. Patient is not currently having periods (Reason: Perimenopausal). Contraception: none Last Pap: over 1 year. Results were: normal Last mammogram: 04/2013. Results were: normal  Obstetric History OB History   Grav Para Term Preterm Abortions TAB SAB Ect Mult Living   3 1 1  2  2         # Outc Date GA Lbr Len/2nd Wgt Sex Del Anes PTL Lv   1 SAB            2 SAB            3 TRM      CS          The following portions of the patient's history were reviewed and updated as appropriate: allergies, current medications, past family history, past medical history, past social history and past surgical history.  Review of Systems Pertinent items are noted in HPI.    Objective:   General:  alert     Abdomen: soft, non-tender; bowel sounds normal; no masses,  no organomegaly   Vulva:  normal  Vagina: normal vagina  Cervix:  no lesions  Corpus: normal size, contour, position, consistency, mobility, non-tender  Adnexa:  normal adnexa    Assessment:   Fibroid uterus--back pain Menopausal--has had episodes of postmenopausal bleeding--last episode 10/13; declines endometrial biopsy Plan:    Pelvic US Ultimately a candidate for a hysterectomy--pt education materials provided Return for a preop visit

## 2013-06-22 NOTE — Addendum Note (Signed)
Addended by: Elby Beck F on: 06/22/2013 06:21 PM   Modules accepted: Orders

## 2013-06-22 NOTE — Addendum Note (Signed)
Addended by: Elby Beck F on: 06/22/2013 06:13 PM   Modules accepted: Orders

## 2013-06-22 NOTE — Addendum Note (Signed)
Addended by: Elby Beck F on: 06/22/2013 11:09 PM   Modules accepted: Orders

## 2013-06-23 ENCOUNTER — Other Ambulatory Visit: Payer: Self-pay | Admitting: Obstetrics & Gynecology

## 2013-06-23 DIAGNOSIS — D219 Benign neoplasm of connective and other soft tissue, unspecified: Secondary | ICD-10-CM

## 2013-06-23 DIAGNOSIS — R102 Pelvic and perineal pain: Secondary | ICD-10-CM

## 2013-06-27 LAB — PAP IG, CT-NG NAA, HPV HIGH-RISK: HPV DNA High Risk: NOT DETECTED

## 2013-06-28 ENCOUNTER — Ambulatory Visit (HOSPITAL_COMMUNITY)
Admission: RE | Admit: 2013-06-28 | Discharge: 2013-06-28 | Disposition: A | Discharge status: Home / Self Care | Payer: BC Managed Care – PPO | Source: Ambulatory Visit | Attending: Obstetrics & Gynecology | Admitting: Obstetrics & Gynecology

## 2013-06-28 DIAGNOSIS — D252 Subserosal leiomyoma of uterus: Secondary | ICD-10-CM | POA: Insufficient documentation

## 2013-06-28 DIAGNOSIS — D251 Intramural leiomyoma of uterus: Secondary | ICD-10-CM | POA: Insufficient documentation

## 2013-06-28 DIAGNOSIS — R102 Pelvic and perineal pain: Secondary | ICD-10-CM

## 2013-06-28 DIAGNOSIS — N949 Unspecified condition associated with female genital organs and menstrual cycle: Secondary | ICD-10-CM | POA: Insufficient documentation

## 2013-06-28 DIAGNOSIS — D219 Benign neoplasm of connective and other soft tissue, unspecified: Secondary | ICD-10-CM

## 2013-06-29 ENCOUNTER — Ambulatory Visit: Payer: BLUE CROSS/BLUE SHIELD | Admitting: Obstetrics & Gynecology

## 2013-07-01 ENCOUNTER — Ambulatory Visit: Payer: BLUE CROSS/BLUE SHIELD | Admitting: Obstetrics & Gynecology

## 2013-07-04 ENCOUNTER — Encounter: Payer: Self-pay | Admitting: Obstetrics & Gynecology

## 2013-07-04 ENCOUNTER — Ambulatory Visit (INDEPENDENT_AMBULATORY_CARE_PROVIDER_SITE_OTHER): Payer: BLUE CROSS/BLUE SHIELD | Admitting: Obstetrics & Gynecology

## 2013-07-04 DIAGNOSIS — D259 Leiomyoma of uterus, unspecified: Secondary | ICD-10-CM

## 2013-07-04 DIAGNOSIS — D219 Benign neoplasm of connective and other soft tissue, unspecified: Secondary | ICD-10-CM

## 2013-07-04 NOTE — Patient Instructions (Signed)
Hysterectomy Information  A hysterectomy is a procedure where your uterus is surgically removed. It will no longer be possible to have menstrual periods or to become pregnant. The tubes and ovaries can be removed (bilateral salpingo-oopherectomy) during this surgery as well.  REASONS FOR A HYSTERECTOMY  Persistent, abnormal bleeding.  Lasting (chronic) pelvic pain or infection.  The lining of the uterus (endometrium) starts growing outside the uterus (endometriosis).  The endometrium starts growing in the muscle of the uterus (adenomyosis).  The uterus falls down into the vagina (pelvic organ prolapse).  Symptomatic uterine fibroids.  Precancerous cells.  Cervical cancer or uterine cancer. TYPES OF HYSTERECTOMIES  Supracervical hysterectomy. This type removes the top part of the uterus, but not the cervix.  Total hysterectomy. This type removes the uterus and cervix.  Radical hysterectomy. This type removes the uterus, cervix, and the fibrous tissue that holds the uterus in place in the pelvis (parametrium). WAYS A HYSTERECTOMY CAN BE PERFORMED  Abdominal hysterectomy. A large surgical cut (incision) is made in the abdomen. The uterus is removed through this incision.  Vaginal hysterectomy. An incision is made in the vagina. The uterus is removed through this incision. There are no abdominal incisions.  Conventional laparoscopic hysterectomy. A thin, lighted tube with a camera (laparoscope) is inserted into 3 or 4 small incisions in the abdomen. The uterus is cut into small pieces. The small pieces are removed through the incisions, or they are removed through the vagina.  Laparoscopic assisted vaginal hysterectomy (LAVH). Three or four small incisions are made in the abdomen. Part of the surgery is performed laparoscopically and part vaginally. The uterus is removed through the vagina.  Robot-assisted laparoscopic hysterectomy. A laparoscope is inserted into 3 or 4 small  incisions in the abdomen. A computer-controlled device is used to give the surgeon a 3D image. This allows for more precise movements of surgical instruments. The uterus is cut into small pieces and removed through the incisions or removed through the vagina. RISKS OF HYSTERECTOMY   Bleeding and risk of blood transfusion. Tell your caregiver if you do not want to receive any blood products.  Blood clots in the legs or lung.  Infection.  Injury to surrounding organs.  Anesthesia problems or side effects.  Conversion to an abdominal hysterectomy. WHAT TO EXPECT AFTER A HYSTERECTOMY  You will be given pain medicine.  You will need to have someone with you for the first 3 to 5 days after you go home.  You will need to follow up with your surgeon in 2 to 4 weeks after surgery to evaluate your progress.  You may have early menopause symptoms like hot flashes, night sweats, and insomnia.  If you had a hysterectomy for a problem that was not a cancer or a condition that could lead to cancer, then you no longer need Pap tests. However, even if you no longer need a Pap test, a regular exam is a good idea to make sure no other problems are starting. Document Released: 06/03/2001 Document Revised: 03/01/2012 Document Reviewed: 07/19/2011 [employer] Patient Information 2014 [employer].  

## 2013-07-04 NOTE — Progress Notes (Signed)
.   Subjective:     Tasha [name] is a 57 y.o. female here for her ultrasound results and to discuss a possible hysterectomy.  No current complaints.  Personal health questionnaire reviewed: no.   Gynecologic History No LMP recorded. Patient is not currently having periods (Reason: Perimenopausal). Contraception: none Last Pap: 2014. Results were: normal Last mammogram: 2014. Results were:normal  Obstetric History OB History   Grav Para Term Preterm Abortions TAB SAB Ect Mult Living   3 1 1  2  2         # Outc Date GA Lbr Len/2nd Wgt Sex Del Anes PTL Lv   1 SAB            2 SAB            3 TRM      CS          The following portions of the patient's history were reviewed and updated as appropriate: allergies, current medications, past family history, past medical history, past social history, past surgical history and problem list.  Review of Systems Pertinent items are noted in HPI.    Objective:     No exam today     Assessment:    Uterine fibroids  Very low suspicion for associated malignancy Plan:  Pt declined endometrial biopsy/preoperative D&C/pelvic MRI.  Low risk of a possible endometrial malignancy reviewed as well as risks/benefits of surgery.  She is aware that her back/leg pain may not resolve postoperatively.  A TRH/bilateral salpingectomies is planned.

## 2013-08-28 ENCOUNTER — Emergency Department (HOSPITAL_COMMUNITY)
Admission: EM | Admit: 2013-08-28 | Discharge: 2013-08-28 | Disposition: A | Discharge status: Home / Self Care | Payer: BC Managed Care – PPO | Attending: Emergency Medicine | Admitting: Emergency Medicine

## 2013-08-28 ENCOUNTER — Encounter (HOSPITAL_COMMUNITY): Payer: Self-pay | Admitting: Nurse Practitioner

## 2013-08-28 DIAGNOSIS — Z79899 Other long term (current) drug therapy: Secondary | ICD-10-CM | POA: Insufficient documentation

## 2013-08-28 DIAGNOSIS — G8929 Other chronic pain: Secondary | ICD-10-CM | POA: Insufficient documentation

## 2013-08-28 DIAGNOSIS — R42 Dizziness and giddiness: Secondary | ICD-10-CM | POA: Insufficient documentation

## 2013-08-28 DIAGNOSIS — I10 Essential (primary) hypertension: Secondary | ICD-10-CM | POA: Insufficient documentation

## 2013-08-28 DIAGNOSIS — Z87891 Personal history of nicotine dependence: Secondary | ICD-10-CM | POA: Insufficient documentation

## 2013-08-28 DIAGNOSIS — Z8742 Personal history of other diseases of the female genital tract: Secondary | ICD-10-CM | POA: Insufficient documentation

## 2013-08-28 DIAGNOSIS — E876 Hypokalemia: Secondary | ICD-10-CM

## 2013-08-28 LAB — CBC
Hemoglobin: 11.6 g/dL — ABNORMAL LOW (ref 12.0–15.0)
MCH: 30.4 pg (ref 26.0–34.0)
MCHC: 34 g/dL (ref 30.0–36.0)
Platelets: 220 10*3/uL (ref 150–400)
RDW: 14.4 % (ref 11.5–15.5)

## 2013-08-28 LAB — BASIC METABOLIC PANEL
Calcium: 9.5 mg/dL (ref 8.4–10.5)
GFR calc Af Amer: >90 mL/min (ref >90)
GFR calc non Af Amer: >90 mL/min (ref >90)
Potassium: 3 mEq/L — ABNORMAL LOW (ref 3.5–5.1)
Sodium: 141 mEq/L (ref 135–145)

## 2013-08-28 LAB — POCT I-STAT TROPONIN I: Troponin i, poc: 0.04 ng/mL (ref 0.00–0.08)

## 2013-08-28 MED ORDER — MECLIZINE HCL 25 MG PO TABS
25.0000 mg | ORAL_TABLET | Freq: Once | ORAL | Status: AC
  Administered 2013-08-28: 25 mg via ORAL
  Filled 2013-08-28: qty 1

## 2013-08-28 MED ORDER — POTASSIUM CHLORIDE CRYS ER 20 MEQ PO TBCR
40.0000 meq | EXTENDED_RELEASE_TABLET | Freq: Once | ORAL | Status: AC
  Administered 2013-08-28: 40 meq via ORAL
  Filled 2013-08-28: qty 2

## 2013-08-28 MED ORDER — MECLIZINE HCL 25 MG PO TABS: 25.0000 mg | ORAL_TABLET | Freq: Four times a day (QID) | ORAL | Status: DC

## 2013-08-28 NOTE — ED Notes (Signed)
Pt c/o dizziness for past month, "feels like everything is spinning around in my head." states symptoms have been intermittent but worse this week. States "it makes me feel like i might pass out." A&Ox4, denies any LOC. No new pain, reports chronic back pain she is being treated for.

## 2013-08-28 NOTE — ED Provider Notes (Signed)
CSN: [id number]     Arrival date & time 08/28/13  4098 History   First MD Initiated Contact with Patient 08/28/13 1120     Chief Complaint  Patient presents with  . Dizziness   (Consider location/radiation/quality/duration/timing/severity/associated sxs/prior Treatment) HPI Comments: Patient presents with a chief complaint of dizziness.  She reports that the dizziness has been intermittent over the past month.  She describes the dizziness as a feeling like the room is spinning.   She reports that the dizziness is brought on by changes in position and typically only lasts a few minutes and then resolves.  She has not taken anything for her symptoms prior to arrival.  She denies any headache, numbness, tingling, weakness, slurred speech, facial asymmetry, difficulty swallowing, chest pain,  SOB, neck pain, or vision changes.  She denies any history of CVA.  She does have a history of HTN, but denies history of DM or hyperlipidemia.    The history is provided by the patient.    Past Medical History  Diagnosis Date  . Hypertension   . Fibroids   . Back pain, chronic   . Tumor associated pain    Past Surgical History  Procedure Laterality Date  . Neck surgery      cadaver bones put in her neck  . Cholecystectomy    . Cesarean section     Family History  Problem Relation Age of Onset  . Hypertension Mother   . Cancer Mother   . Heart disease Father    History  Substance Use Topics  . Smoking status: Former Smoker    Quit date: 12/22/2008  . Smokeless tobacco: Never Used  . Alcohol Use: No   OB History   Grav Para Term Preterm Abortions TAB SAB Ect Mult Living   3 1 1  2  2         Review of Systems  Neurological: Positive for dizziness.  All other systems reviewed and are negative.    Allergies  Sulfa antibiotics  Home Medications   Current Outpatient Rx  Name  Route  Sig  Dispense  Refill  . hydrochlorothiazide (HYDRODIURIL) 25 MG tablet   Oral   Take 25 mg by  mouth daily.          Marland Kitchen HYDROcodone-acetaminophen (NORCO/VICODIN) 5-325 MG per tablet   Oral   Take 1 tablet by mouth every 6 (six) hours as needed for pain.         Marland Kitchen ibuprofen (ADVIL,MOTRIN) 800 MG tablet   Oral   Take 800 mg by mouth every 8 (eight) hours as needed. For pain          . meloxicam (MOBIC) 15 MG tablet   Oral   Take 15 mg by mouth daily as needed for pain.          . Multiple Vitamin (MULTIVITAMIN WITH MINERALS) TABS   Oral   Take 1 tablet by mouth daily.         . traMADol (ULTRAM) 50 MG tablet   Oral   Take 50 mg by mouth every 6 (six) hours as needed for pain.          BP 122/102  Pulse 72  Temp(Src) 97.1 F (36.2 C) (Oral)  Resp 15  Ht 5\' 5"  (1.651 m)  Wt 214 lb (97.07 kg)  BMI 35.61 kg/m2  SpO2 97% Physical Exam  Nursing note and vitals reviewed. Constitutional: She appears well-developed and well-nourished.  HENT:  Head: Normocephalic and  atraumatic.  Eyes: EOM are normal. Pupils are equal, round, and reactive to light.  Neck: Normal range of motion. Neck supple.  Cardiovascular: Normal rate, regular rhythm, normal heart sounds and intact distal pulses.   Pulmonary/Chest: Effort normal and breath sounds normal.  Neurological: She is alert. She has normal strength. No cranial nerve deficit or sensory deficit. Coordination and gait normal.  Normal finger to nose testing. Normal rapid alternating movements Normal gait, no ataxia  Skin: Skin is warm and dry.  Psychiatric: She has a normal mood and affect.    ED Course  Procedures (including critical care time) Labs Review Labs Reviewed  CBC - Abnormal; Notable for the following:    RBC 3.81 (*)    Hemoglobin 11.6 (*)    HCT 34.1 (*)    All other components within normal limits  BASIC METABOLIC PANEL  POCT I-STAT TROPONIN I   Imaging Review No results found.  Patient discussed with Dr. Fonnie Jarvis.    12:50 PM Reassessed patient after given Antivert.  She reports that her  symptoms had improved.  Patient sat up in her bed and ambulated in her room without symptoms. MDM  No diagnosis found. Patient presents with a chief complaint of vertigo.  Vertigo seems to be peripheral.  Vertigo is positional and is temporary.  No changes in vision.  Symptoms have been present intermittently over the past month.  Patient has a normal neurological exam.  Symptoms improved after given Antivert.  Feel that the patient is stable for discharge.  Patient instructed to follow up with her PCP.  Return precautions given.    Pascal Lux [place], PA-C 08/28/13 1651

## 2013-08-29 NOTE — ED Provider Notes (Signed)
Medical screening examination/treatment/procedure(s) were conducted as a shared visit with non-physician practitioner(s) and myself.  I personally evaluated the patient during the encounter.  Pt seen in Triage, doubt ACS.  Hurman Horn, MD 08/29/13 [phone]

## 2013-09-27 ENCOUNTER — Encounter: Payer: Self-pay | Admitting: Neurology

## 2013-09-27 ENCOUNTER — Ambulatory Visit (INDEPENDENT_AMBULATORY_CARE_PROVIDER_SITE_OTHER): Payer: BLUE CROSS/BLUE SHIELD | Admitting: Neurology

## 2013-09-27 VITALS — BP 145/87 | HR 79 | Ht 65.0 in | Wt 225.0 lb

## 2013-09-27 DIAGNOSIS — R42 Dizziness and giddiness: Secondary | ICD-10-CM

## 2013-09-27 NOTE — Progress Notes (Signed)
Reason for visit: Vertigo  Tasha [name] is a 57 y.o. female  History of present illness:  Tasha [name] is a 57 year old right-handed black female with a history of vertigo that began proximally 6 weeks ago. The patient indicates that when she got up out of bed, she noted a spinning sensation that last only a few moments. The patient has had episodes off and on over the last 6 months, usually associated with tinnitus. The patient had no tinnitus with this most recent episode. The patient indicates that if she sits still for several moments, the dizziness will go away. The patient may have dizziness if she stoops, or when she gets out of bed, or while lying in bed. Occasionally, the vertigo will wake her up at night. The patient does not have significant nausea and vomiting with the episodes. The patient denies any focal numbness or weakness of the face, arms, or legs. The patient denies any slurred speech, difficulty swallowing, or syncopal episodes. The patient does have some gait imbalance during the dizziness. The patient denies any double vision. The patient has not had any change in hearing, ringing in the ears, or ear pain. The patient went to the emergency room, and she is referred to this office for an evaluation. The use of Antivert has not helped her dizziness.  Past Medical History  Diagnosis Date  . Hypertension   . Fibroids   . Back pain, chronic   . Tumor associated pain   . Dizziness and giddiness 09/27/2013    Past Surgical History  Procedure Laterality Date  . Neck surgery      cadaver bones put in her neck  . Cholecystectomy    . Cesarean section      Family History  Problem Relation Age of Onset  . Hypertension Mother   . Cancer Mother   . Heart disease Father     Social history:  reports that she quit smoking about 4 years ago. She has never used smokeless tobacco. She reports that she does not drink alcohol or use illicit drugs.  Medications:  Current Outpatient  Prescriptions on File Prior to Visit  Medication Sig Dispense Refill  . hydrochlorothiazide (HYDRODIURIL) 25 MG tablet Take 25 mg by mouth daily.       Marland Kitchen HYDROcodone-acetaminophen (NORCO/VICODIN) 5-325 MG per tablet Take 1 tablet by mouth every 6 (six) hours as needed for pain.      Marland Kitchen ibuprofen (ADVIL,MOTRIN) 800 MG tablet Take 800 mg by mouth every 8 (eight) hours as needed. For pain       . meclizine (ANTIVERT) 25 MG tablet Take 1 tablet (25 mg total) by mouth 4 (four) times daily.  28 tablet  0  . meloxicam (MOBIC) 15 MG tablet Take 15 mg by mouth daily as needed for pain.       . Multiple Vitamin (MULTIVITAMIN WITH MINERALS) TABS Take 1 tablet by mouth daily.      . traMADol (ULTRAM) 50 MG tablet Take 50 mg by mouth every 6 (six) hours as needed for pain.       No current facility-administered medications on file prior to visit.      Allergies  Allergen Reactions  . Sulfa Antibiotics Itching    ROS:  Out of a complete 14 system review of symptoms, the patient complains only of the following symptoms, and all other reviewed systems are negative.  Fatigue Snoring Dizziness  Blood pressure 145/87, pulse 79, height 5\' 5"  (1.651 m), weight  225 lb (102.059 kg).  Physical Exam  General: The patient is alert and cooperative at the time of the examination. The patient is moderately obese.  Head: Pupils are equal, round, and reactive to light. Discs are flat bilaterally. Tympanic membranes are clear bilaterally.  Neck: The neck is supple, no carotid bruits are noted.  Respiratory: The respiratory examination is clear.  Cardiovascular: The cardiovascular examination reveals a regular rate and rhythm, no obvious murmurs or rubs are noted.  Skin: Extremities are without significant edema.  Neurologic Exam  Mental status:  Cranial nerves: Facial symmetry is present. There is good sensation of the face to pinprick and soft touch bilaterally. The strength of the facial muscles and  the muscles to head turning and shoulder shrug are normal bilaterally. Speech is well enunciated, no aphasia or dysarthria is noted. Extraocular movements are full. Visual fields are full.  Motor: The motor testing reveals 5 over 5 strength of all 4 extremities. Good symmetric motor tone is noted throughout.  Sensory: Sensory testing is intact to pinprick, soft touch, vibration sensation, and position sense on all 4 extremities. No evidence of extinction is noted.  Coordination: Cerebellar testing reveals good finger-nose-finger and heel-to-shin bilaterally. Nyan-Barrany procedure was positive, with rotatory and horizontal nystagmus when turning the head to the right, more prominent when the patient is looking to the right.  Gait and station: Gait is normal. Tandem gait is normal. Romberg is negative. No drift is seen.  Reflexes: Deep tendon reflexes are symmetric and normal bilaterally, with the exception that the left knee jerk reflexes depressed. Toes are downgoing bilaterally.   Assessment/Plan:  1. Positional vertigo  The patient appears to have an examination consistent with positional vertigo. The patient has prominent nystagmus with horizontal and rotatory components, more prominent to the right with subjective sensations of vertigo. The patient may have nausea with this. The patient be sent for vestibular rehabilitation, and if this is not effective, the patient will be sent for MRI evaluation of the brain. Clinical examination otherwise is normal.  Marlan Palau MD 09/27/2013 8:05 PM  [facility] [address] [place], [place] [address]  Phone [phone] Fax [phone]

## 2013-10-05 ENCOUNTER — Ambulatory Visit: Payer: BC Managed Care – PPO | Admitting: Physical Therapy

## 2013-10-05 ENCOUNTER — Ambulatory Visit: Payer: BC Managed Care – PPO | Attending: Neurology | Admitting: Physical Therapy

## 2013-10-05 DIAGNOSIS — IMO0001 Reserved for inherently not codable concepts without codable children: Secondary | ICD-10-CM | POA: Insufficient documentation

## 2013-10-05 DIAGNOSIS — H8110 Benign paroxysmal vertigo, unspecified ear: Secondary | ICD-10-CM | POA: Insufficient documentation

## 2013-10-20 ENCOUNTER — Ambulatory Visit: Payer: BC Managed Care – PPO | Admitting: Physical Therapy

## 2013-10-28 ENCOUNTER — Ambulatory Visit: Payer: BC Managed Care – PPO | Admitting: Physical Therapy

## 2013-11-02 ENCOUNTER — Encounter: Payer: BC Managed Care – PPO | Admitting: Physical Therapy

## 2013-11-04 ENCOUNTER — Ambulatory Visit: Payer: BC Managed Care – PPO | Admitting: Physical Therapy

## 2013-11-08 ENCOUNTER — Ambulatory Visit: Payer: BC Managed Care – PPO | Admitting: Physical Therapy

## 2013-11-11 ENCOUNTER — Encounter: Payer: BC Managed Care – PPO | Admitting: Physical Therapy

## 2013-12-02 ENCOUNTER — Encounter (HOSPITAL_COMMUNITY): Admission: RE | Payer: Self-pay | Source: Ambulatory Visit

## 2013-12-02 ENCOUNTER — Ambulatory Visit (HOSPITAL_COMMUNITY)
Admission: RE | Admit: 2013-12-02 | Payer: BC Managed Care – PPO | Source: Ambulatory Visit | Admitting: Obstetrics & Gynecology

## 2013-12-02 SURGERY — ROBOTIC ASSISTED TOTAL HYSTERECTOMY WITH BILATERAL SALPINGO OOPHORECTOMY
Anesthesia: General | Laterality: Bilateral

## 2014-01-05 ENCOUNTER — Ambulatory Visit: Payer: BLUE CROSS/BLUE SHIELD | Admitting: Nurse Practitioner

## 2014-01-05 ENCOUNTER — Telehealth: Payer: Self-pay | Admitting: Nurse Practitioner

## 2014-01-05 NOTE — Telephone Encounter (Signed)
No show for scheduled appt 

## 2014-03-06 ENCOUNTER — Other Ambulatory Visit (HOSPITAL_COMMUNITY): Payer: Self-pay | Admitting: Internal Medicine

## 2014-03-06 DIAGNOSIS — Z1231 Encounter for screening mammogram for malignant neoplasm of breast: Secondary | ICD-10-CM

## 2014-03-22 ENCOUNTER — Ambulatory Visit (INDEPENDENT_AMBULATORY_CARE_PROVIDER_SITE_OTHER): Payer: BLUE CROSS/BLUE SHIELD | Admitting: Obstetrics & Gynecology

## 2014-03-22 ENCOUNTER — Encounter: Payer: Self-pay | Admitting: *Deleted

## 2014-03-22 ENCOUNTER — Encounter: Payer: Self-pay | Admitting: Obstetrics & Gynecology

## 2014-03-22 VITALS — BP 133/90 | HR 75 | Temp 97.3°F | Ht 64.0 in | Wt 220.0 lb

## 2014-03-22 DIAGNOSIS — R109 Unspecified abdominal pain: Secondary | ICD-10-CM

## 2014-03-22 NOTE — Progress Notes (Signed)
Subjective:     Tasha [name] is a 58 y.o. female here for a routine exam.  Current complaints: Patient in office today for a problem visit. Patient states she has a history of uterine fibroids. Patients states she is having lower abdomen, pelvic and lower back pain. Patient states she feels like she is having pressure on her kidneys. Patient states she is having frequent urination. Patient states nothing helps the pain. Patient states standing, sitting and bending makes the pain worse. Patient states the pain wakes her up from her sleep. Patient states she "feels like something is moving in her abdomen."   Personal health questionnaire:  Is patient Ashkenazi Jewish, have a family history of breast and/or ovarian cancer: no Is there a family history of uterine cancer diagnosed at age < 70, gastrointestinal cancer, urinary tract cancer, family member who is a Field seismologist syndrome-associated carrier: no Is the patient overweight and hypertensive, family history of diabetes, personal history of gestational diabetes or PCOS: yes Is patient over 65, have PCOS,  family history of premature CHD under age 61, diabetes, smoke, have hypertension or peripheral artery disease:  unsure  The HPI was reviewed and explored in further detail by the provider. Gynecologic History No LMP recorded. Patient is not currently having periods (Reason: Perimenopausal). Contraception: none  Obstetric History OB History  Gravida Para Term Preterm AB SAB TAB Ectopic Multiple Living  4 1 1  3 2    1     # Outcome Date GA Lbr Len/2nd Weight Sex Delivery Anes PTL Lv  4 TRM 05/28/77 [redacted]w[redacted]d  6 lb 6 oz (2.892 kg) F CS Spinal  Y  3 ABT           2 SAB           1 SAB                The following portions of the patient's history were reviewed and updated as appropriate: allergies, current medications, past family history, past medical history, past social history, past surgical history and problem list.  Review of  Systems Pertinent items are noted in HPI.    Objective:      General appearance: alert Breasts: normal appearance, no masses or tenderness Abdomen: soft, non-tender; bowel sounds normal; no masses,  no organomegaly Pelvic: cervix normal in appearance, external genitalia normal, no adnexal masses or tenderness, uterus mildly enlarged and vagina normal without discharge       50% of 15 min visit spent on counseling and coordination of care.   Assessment:    Uterine fibroids--?etiology of multiple complaints   Plan:   Orders Placed This Encounter  Procedures  . NMR Lipoprofile with Lipids  Management options reviewed Return prn

## 2014-03-23 LAB — NMR LIPOPROFILE WITH LIPIDS
CHOLESTEROL, TOTAL: 213 mg/dL — AB (ref <200)
HDL PARTICLE NUMBER: 30.1 umol/L — AB (ref >=30.5)
HDL Size: 9 nm — ABNORMAL LOW (ref >=9.2)
HDL-C: 53 mg/dL (ref >=40)
LDL CALC: 144 mg/dL — AB (ref <100)
LDL Particle Number: 1935 nmol/L — ABNORMAL HIGH (ref <1000)
LDL Size: 21.4 nm (ref >20.5)
LP-IR SCORE: 31 (ref <=45)
Large HDL-P: 5.1 umol/L (ref >=4.8)
Large VLDL-P: <0.8 nmol/L (ref <=2.7)
Small LDL Particle Number: 661 nmol/L — ABNORMAL HIGH (ref <=527)
Triglycerides: 81 mg/dL (ref <150)
VLDL Size: 45.6 nm (ref <=46.6)

## 2014-03-27 ENCOUNTER — Ambulatory Visit (HOSPITAL_COMMUNITY)
Admission: RE | Admit: 2014-03-27 | Discharge: 2014-03-27 | Disposition: A | Discharge status: Home / Self Care | Payer: BC Managed Care – PPO | Source: Ambulatory Visit | Attending: Internal Medicine | Admitting: Internal Medicine

## 2014-03-27 DIAGNOSIS — Z1231 Encounter for screening mammogram for malignant neoplasm of breast: Secondary | ICD-10-CM

## 2014-05-08 ENCOUNTER — Telehealth: Payer: Self-pay | Admitting: Neurology

## 2014-05-08 NOTE — Telephone Encounter (Signed)
Pt called states that she needs to get into rehab soon, rehab advised her she needed to get another referral from the Dr. Please call pt concerning this matter. Thanks

## 2014-05-09 NOTE — Telephone Encounter (Signed)
Called patient and left message to call back to schedule with NP for a 3 month f/u

## 2014-05-10 NOTE — Telephone Encounter (Signed)
Called and scheduled appt with NP(Ms Millikan),appt. Confirmed with patient

## 2014-05-12 ENCOUNTER — Ambulatory Visit (INDEPENDENT_AMBULATORY_CARE_PROVIDER_SITE_OTHER): Payer: BLUE CROSS/BLUE SHIELD | Admitting: Adult Health

## 2014-05-12 ENCOUNTER — Encounter: Payer: Self-pay | Admitting: Adult Health

## 2014-05-12 VITALS — BP 109/64 | HR 69 | Temp 96.8°F | Ht 63.0 in | Wt 226.8 lb

## 2014-05-12 DIAGNOSIS — H8110 Benign paroxysmal vertigo, unspecified ear: Secondary | ICD-10-CM | POA: Insufficient documentation

## 2014-05-12 NOTE — Patient Instructions (Signed)

## 2014-05-12 NOTE — Progress Notes (Signed)
I have read the note, and I agree with the clinical assessment and plan.  Tasha [name]   

## 2014-05-12 NOTE — Progress Notes (Signed)
PATIENT: Tasha [name] DOB: 09/16/1956  REASON FOR VISIT: follow up HISTORY FROM: patient  HISTORY OF PRESENT ILLNESS: Tasha [name] is a 58 year old right-handed black female with a history of vertigo. She returns today for follow-up. Patient was sent to vestibular rehab and states that it was very beneficial. After she had the rehab in November 2014 it completely resolved the vertigo. Patients states that it started again when she was laying in the bed and turned over in the bed, states that the room started spinning. It has made her off balance and when she was climbing up the stairs she missed a step due to the dizziness. Patient can squeeze her eyes shut and that will help resolve the dizziness but then if she makes a certain position change the dizziness will start again. Patient will get nauseous but has not vomited. No new medical issues since the last visit.   REVIEW OF SYSTEMS: Full 14 system review of systems performed and notable only for:  Constitutional: fatigue Eyes: N/A Ear/Nose/Throat: ringing in the ears Skin: N/A  Cardiovascular: N/A  Respiratory: N/A  Gastrointestinal: N/A  Genitourinary: N/A Hematology/Lymphatic: N/A  Endocrine: N/A Musculoskeletal: joint pain, aching muscles, muscle cramps Allergy/Immunology: N/A  Neurological: dizziness Psychiatric: N/A Sleep: snoring   ALLERGIES: Allergies  Allergen Reactions  . Sulfa Antibiotics Itching    HOME MEDICATIONS: Outpatient Prescriptions Prior to Visit  Medication Sig Dispense Refill  . hydrochlorothiazide (HYDRODIURIL) 25 MG tablet Take 25 mg by mouth daily.       Marland Kitchen ibuprofen (ADVIL,MOTRIN) 800 MG tablet Take 800 mg by mouth every 8 (eight) hours as needed. For pain        No facility-administered medications prior to visit.    PAST MEDICAL HISTORY: Past Medical History  Diagnosis Date  . Hypertension   . Fibroids   . Back pain, chronic   . Tumor associated pain   . Dizziness and giddiness 09/27/2013     PAST SURGICAL HISTORY: Past Surgical History  Procedure Laterality Date  . Neck surgery      cadaver bones put in her neck  . Cholecystectomy    . Cesarean section      FAMILY HISTORY: Family History  Problem Relation Age of Onset  . Hypertension Mother   . Cancer Mother   . Heart disease Father     SOCIAL HISTORY: History   Social History  . Marital Status: Single    Spouse Name: N/A    Number of Children: 1  . Years of Education: college   Occupational History  . [employer]    Social History Main Topics  . Smoking status: Former Smoker    Quit date: 12/22/2008  . Smokeless tobacco: Never Used  . 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 Drug Use: 2.00 per week    Special: Marijuana     Comment: recovering drug addict  . Sexual Activity: Yes    Partners: Male    Birth Control/ Protection: None   Other Topics Concern  . Not on file   Social History Narrative  . No narrative on file    PHYSICAL EXAM  Filed Vitals:   05/12/14 0913  BP: 109/64  Pulse: 69  Temp: 96.8 F (36 C)  TempSrc: Oral  Height: 5\' 3"  (1.6 m)  Weight: 226 lb 12 oz (102.853 kg)   Body mass index is 40.18 kg/(m^2).  Generalized: Well developed, in no acute  distress   Neurological examination  Mentation: Alert oriented to time, place, history taking. Follows all commands speech and language fluent Cranial nerve II-XII:  Extraocular movements were full, visual field were full on confrontational test. Horizontal nystagmus to the right.  Motor: The motor testing reveals 5 over 5 strength of all 4 extremities. Good symmetric motor tone is noted throughout.  Sensory: Sensory testing is intact to soft touch on all 4 extremities. No evidence of extinction is noted.  Coordination: Cerebellar testing reveals good finger-nose-finger and heel-to-shin bilaterally. Dix Hallpike maneuver positive. Patient has horizontal nystagmus primarily to the right.    Gait and station: Gait is normal. Tandem gait is normal. Romberg is negative. No drift is seen.  Reflexes: Deep tendon reflexes are symmetric and normal bilaterally.   DIAGNOSTIC DATA (LABS, IMAGING, TESTING) - I reviewed patient records, labs, notes, testing and imaging myself where available.  Lab Results  Component Value Date   WBC 5.2 08/28/2013   HGB 11.6* 08/28/2013   HCT 34.1* 08/28/2013   MCV 89.5 08/28/2013   PLT 220 08/28/2013      Component Value Date/Time   NA 141 08/28/2013 1120   K 3.0* 08/28/2013 1120   CL 108 08/28/2013 1120   CO2 22 08/28/2013 1120   GLUCOSE 103* 08/28/2013 1120   BUN 18 08/28/2013 1120   CREATININE 0.71 08/28/2013 1120   CALCIUM 9.5 08/28/2013 1120   PROT 7.6 05/10/2013 1145   ALBUMIN 3.8 05/10/2013 1145   AST 13 05/10/2013 1145   ALT 16 05/10/2013 1145   ALKPHOS 75 05/10/2013 1145   BILITOT 0.3 05/10/2013 1145   GFRNONAA >90 08/28/2013 1120   GFRAA >90 08/28/2013 1120   Lab Results  Component Value Date   LDLCALC 144* 03/22/2014   TRIG 81 03/22/2014    ASSESSMENT AND PLAN 58 y.o. year old female  has a past medical history of Hypertension; Fibroids; Back pain, chronic; Tumor associated pain; and Dizziness and giddiness (09/27/2013). here with:  1. Benign paroxysmal positional vertigo  Patient has had positional vertigo in the past and had resolution with vestibular rehab.  Will make a referral to Neuro Rehab.  Patient advised to let us know if neuro rehab is not successful. Follow up in 6 months or sooner if needed.    Ward Givens, MSN, NP-C 05/12/2014, 9:19 AM [facility] [address] [place], [place] [address] [phone]  Note: This document was prepared with digital dictation and possible smart phrase technology. Any transcriptional errors that result from this process are unintentional.

## 2014-05-22 ENCOUNTER — Ambulatory Visit
Payer: BC Managed Care – PPO | Attending: Internal Medicine | Admitting: Rehabilitative and Restorative Service Providers"

## 2014-05-22 DIAGNOSIS — H8110 Benign paroxysmal vertigo, unspecified ear: Secondary | ICD-10-CM | POA: Insufficient documentation

## 2014-05-22 DIAGNOSIS — IMO0001 Reserved for inherently not codable concepts without codable children: Secondary | ICD-10-CM | POA: Diagnosis present

## 2014-05-29 ENCOUNTER — Ambulatory Visit: Payer: BC Managed Care – PPO | Admitting: Rehabilitative and Restorative Service Providers"

## 2014-05-29 DIAGNOSIS — IMO0001 Reserved for inherently not codable concepts without codable children: Secondary | ICD-10-CM | POA: Diagnosis not present

## 2014-07-07 ENCOUNTER — Other Ambulatory Visit: Payer: Self-pay | Admitting: Neurosurgery

## 2014-07-07 DIAGNOSIS — C720 Malignant neoplasm of spinal cord: Secondary | ICD-10-CM

## 2014-07-12 ENCOUNTER — Ambulatory Visit
Admission: RE | Admit: 2014-07-12 | Discharge: 2014-07-12 | Disposition: A | Discharge status: Home / Self Care | Payer: BC Managed Care – PPO | Source: Ambulatory Visit | Attending: Neurosurgery | Admitting: Neurosurgery

## 2014-07-12 DIAGNOSIS — C720 Malignant neoplasm of spinal cord: Secondary | ICD-10-CM

## 2014-07-12 MED ORDER — GADOBENATE DIMEGLUMINE 529 MG/ML IV SOLN
20.0000 mL | Freq: Once | INTRAVENOUS | Status: AC | PRN
  Administered 2014-07-12: 20 mL via INTRAVENOUS

## 2014-07-26 ENCOUNTER — Observation Stay (HOSPITAL_COMMUNITY)
Admission: EM | Admit: 2014-07-26 | Discharge: 2014-07-27 | Disposition: A | Discharge status: Home / Self Care | Payer: BC Managed Care – PPO | Attending: Internal Medicine | Admitting: Internal Medicine

## 2014-07-26 ENCOUNTER — Encounter (HOSPITAL_COMMUNITY): Payer: Self-pay | Admitting: Emergency Medicine

## 2014-07-26 ENCOUNTER — Emergency Department (HOSPITAL_COMMUNITY): Payer: BC Managed Care – PPO

## 2014-07-26 DIAGNOSIS — E876 Hypokalemia: Principal | ICD-10-CM

## 2014-07-26 DIAGNOSIS — Z79899 Other long term (current) drug therapy: Secondary | ICD-10-CM | POA: Diagnosis not present

## 2014-07-26 DIAGNOSIS — G8929 Other chronic pain: Secondary | ICD-10-CM | POA: Diagnosis not present

## 2014-07-26 DIAGNOSIS — Z9089 Acquired absence of other organs: Secondary | ICD-10-CM | POA: Insufficient documentation

## 2014-07-26 DIAGNOSIS — Z8742 Personal history of other diseases of the female genital tract: Secondary | ICD-10-CM | POA: Diagnosis not present

## 2014-07-26 DIAGNOSIS — R109 Unspecified abdominal pain: Secondary | ICD-10-CM | POA: Diagnosis not present

## 2014-07-26 DIAGNOSIS — K5900 Constipation, unspecified: Secondary | ICD-10-CM

## 2014-07-26 DIAGNOSIS — Z87891 Personal history of nicotine dependence: Secondary | ICD-10-CM | POA: Diagnosis not present

## 2014-07-26 DIAGNOSIS — Z881 Allergy status to other antibiotic agents status: Secondary | ICD-10-CM | POA: Diagnosis not present

## 2014-07-26 DIAGNOSIS — I10 Essential (primary) hypertension: Secondary | ICD-10-CM | POA: Diagnosis not present

## 2014-07-26 DIAGNOSIS — K921 Melena: Secondary | ICD-10-CM | POA: Insufficient documentation

## 2014-07-26 DIAGNOSIS — K625 Hemorrhage of anus and rectum: Secondary | ICD-10-CM

## 2014-07-26 DIAGNOSIS — Z8669 Personal history of other diseases of the nervous system and sense organs: Secondary | ICD-10-CM | POA: Diagnosis not present

## 2014-07-26 HISTORY — DX: Dizziness and giddiness: R42

## 2014-07-26 HISTORY — DX: Reserved for concepts with insufficient information to code with codable children: IMO0002

## 2014-07-26 HISTORY — DX: Tubulo-interstitial nephritis, not specified as acute or chronic: N12

## 2014-07-26 LAB — I-STAT CHEM 8, ED
BUN: 19 mg/dL (ref 6–23)
CALCIUM ION: 1.22 mmol/L (ref 1.12–1.23)
CHLORIDE: 105 meq/L (ref 96–112)
CREATININE: 0.9 mg/dL (ref 0.50–1.10)
GLUCOSE: 124 mg/dL — AB (ref 70–99)
HCT: 38 % (ref 36.0–46.0)
Hemoglobin: 12.9 g/dL (ref 12.0–15.0)
POTASSIUM: 2.3 meq/L — AB (ref 3.7–5.3)
Sodium: 143 mEq/L (ref 137–147)
TCO2: 22 mmol/L (ref 0–100)

## 2014-07-26 LAB — CBC WITH DIFFERENTIAL/PLATELET
Basophils Absolute: 0 10*3/uL (ref 0.0–0.1)
Basophils Relative: 0 % (ref 0–1)
EOS PCT: 0 % (ref 0–5)
Eosinophils Absolute: 0 10*3/uL (ref 0.0–0.7)
HEMATOCRIT: 35.5 % — AB (ref 36.0–46.0)
HEMOGLOBIN: 12.2 g/dL (ref 12.0–15.0)
LYMPHS ABS: 2 10*3/uL (ref 0.7–4.0)
LYMPHS PCT: 28 % (ref 12–46)
MCH: 30.1 pg (ref 26.0–34.0)
MCHC: 34.4 g/dL (ref 30.0–36.0)
MCV: 87.7 fL (ref 78.0–100.0)
MONO ABS: 0.4 10*3/uL (ref 0.1–1.0)
MONOS PCT: 5 % (ref 3–12)
Neutro Abs: 4.8 10*3/uL (ref 1.7–7.7)
Neutrophils Relative %: 67 % (ref 43–77)
Platelets: 269 10*3/uL (ref 150–400)
RBC: 4.05 MIL/uL (ref 3.87–5.11)
RDW: 15.2 % (ref 11.5–15.5)
WBC: 7.3 10*3/uL (ref 4.0–10.5)

## 2014-07-26 LAB — URINALYSIS, ROUTINE W REFLEX MICROSCOPIC
Bilirubin Urine: NEGATIVE
Glucose, UA: NEGATIVE mg/dL
Hgb urine dipstick: NEGATIVE
KETONES UR: NEGATIVE mg/dL
LEUKOCYTES UA: NEGATIVE
NITRITE: NEGATIVE
PH: 7 (ref 5.0–8.0)
Protein, ur: NEGATIVE mg/dL
SPECIFIC GRAVITY, URINE: 1.013 (ref 1.005–1.030)
UROBILINOGEN UA: 0.2 mg/dL (ref 0.0–1.0)

## 2014-07-26 LAB — COMPREHENSIVE METABOLIC PANEL
ALBUMIN: 3.7 g/dL (ref 3.5–5.2)
ALT: 22 U/L (ref 0–35)
AST: 16 U/L (ref 0–37)
Alkaline Phosphatase: 74 U/L (ref 39–117)
Anion gap: 17 — ABNORMAL HIGH (ref 5–15)
BILIRUBIN TOTAL: 0.6 mg/dL (ref 0.3–1.2)
BUN: 19 mg/dL (ref 6–23)
CHLORIDE: 104 meq/L (ref 96–112)
CO2: 21 mEq/L (ref 19–32)
CREATININE: 0.81 mg/dL (ref 0.50–1.10)
Calcium: 9.4 mg/dL (ref 8.4–10.5)
GFR calc Af Amer: >90 mL/min (ref >90)
GFR calc non Af Amer: 79 mL/min — ABNORMAL LOW (ref >90)
Glucose, Bld: 115 mg/dL — ABNORMAL HIGH (ref 70–99)
Sodium: 142 mEq/L (ref 137–147)
TOTAL PROTEIN: 7.7 g/dL (ref 6.0–8.3)

## 2014-07-26 LAB — MAGNESIUM: Magnesium: 2.6 mg/dL — ABNORMAL HIGH (ref 1.5–2.5)

## 2014-07-26 LAB — PHOSPHORUS: PHOSPHORUS: 3.7 mg/dL (ref 2.3–4.6)

## 2014-07-26 LAB — POC OCCULT BLOOD, ED: Fecal Occult Bld: POSITIVE — AB

## 2014-07-26 MED ORDER — SODIUM CHLORIDE 0.9 % IV BOLUS (SEPSIS)
1000.0000 mL | Freq: Once | INTRAVENOUS | Status: AC
  Administered 2014-07-26: 1000 mL via INTRAVENOUS

## 2014-07-26 MED ORDER — POTASSIUM CHLORIDE CRYS ER 20 MEQ PO TBCR
40.0000 meq | EXTENDED_RELEASE_TABLET | Freq: Once | ORAL | Status: AC
  Administered 2014-07-26: 40 meq via ORAL
  Filled 2014-07-26: qty 2

## 2014-07-26 MED ORDER — POTASSIUM CHLORIDE 10 MEQ/100ML IV SOLN
10.0000 meq | INTRAVENOUS | Status: AC
  Administered 2014-07-26 (×3): 10 meq via INTRAVENOUS
  Filled 2014-07-26 (×4): qty 100

## 2014-07-26 MED ORDER — POTASSIUM CHLORIDE CRYS ER 20 MEQ PO TBCR
40.0000 meq | EXTENDED_RELEASE_TABLET | Freq: Once | ORAL | Status: DC
  Filled 2014-07-26: qty 2

## 2014-07-26 NOTE — ED Provider Notes (Signed)
8:00 PM Patient taken form Dr. Darl Householder in sign out. She came in for abdominal pain, constipation and bloody diarrhea. Her potassium was found to be <2.2. She has been seen in consult by Dr. Jennette Kettle who does not feel that she meets obs admission criteria, she is receiving IV potassium. Will recheck her potassium after 4 IV runs.    EKG Interpretation  Date/Time:  Wednesday July 26 2014 20:35:19 EDT Ventricular Rate:  64 PR Interval:  258 QRS Duration: 108 QT Interval:  417 QTC Calculation: 430 R Axis:   49 Text Interpretation:  Sinus rhythm Prolonged PR interval Abnormal R-wave progression, early transition PR prolonged compared to previous tracing  Confirmed by YAO  MD, DAVID ([id number]) on 07/26/2014 10:15:21 PM       EKg shows prolonged PR interval.  No QT prolongation, Her magnesium was found to be elevated as well.   Results for orders placed during the hospital encounter of 07/26/14  URINALYSIS, ROUTINE W REFLEX MICROSCOPIC      Result Value Ref Range   Color, Urine YELLOW  YELLOW   APPearance CLEAR  CLEAR   Specific Gravity, Urine 1.013  1.005 - 1.030   pH 7.0  5.0 - 8.0   Glucose, UA NEGATIVE  NEGATIVE mg/dL   Hgb urine dipstick NEGATIVE  NEGATIVE   Bilirubin Urine NEGATIVE  NEGATIVE   Ketones, ur NEGATIVE  NEGATIVE mg/dL   Protein, ur NEGATIVE  NEGATIVE mg/dL   Urobilinogen, UA 0.2  0.0 - 1.0 mg/dL   Nitrite NEGATIVE  NEGATIVE   Leukocytes, UA NEGATIVE  NEGATIVE  COMPREHENSIVE METABOLIC PANEL      Result Value Ref Range   Sodium 142  137 - 147 mEq/L   Potassium <2.2 (*) 3.7 - 5.3 mEq/L   Chloride 104  96 - 112 mEq/L   CO2 21  19 - 32 mEq/L   Glucose, Bld 115 (*) 70 - 99 mg/dL   BUN 19  6 - 23 mg/dL   Creatinine, Ser 0.81  0.50 - 1.10 mg/dL   Calcium 9.4  8.4 - 10.5 mg/dL   Total Protein 7.7  6.0 - 8.3 g/dL   Albumin 3.7  3.5 - 5.2 g/dL   AST 16  0 - 37 U/L   ALT 22  0 - 35 U/L   Alkaline Phosphatase 74  39 - 117 U/L   Total Bilirubin 0.6  0.3 - 1.2 mg/dL    GFR calc non Af Amer 79 (*) >90 mL/min   GFR calc Af Amer >90  >90 mL/min   Anion gap 17 (*) 5 - 15  CBC WITH DIFFERENTIAL      Result Value Ref Range   WBC 7.3  4.0 - 10.5 K/uL   RBC 4.05  3.87 - 5.11 MIL/uL   Hemoglobin 12.2  12.0 - 15.0 g/dL   HCT 35.5 (*) 36.0 - 46.0 %   MCV 87.7  78.0 - 100.0 fL   MCH 30.1  26.0 - 34.0 pg   MCHC 34.4  30.0 - 36.0 g/dL   RDW 15.2  11.5 - 15.5 %   Platelets 269  150 - 400 K/uL   Neutrophils Relative % 67  43 - 77 %   Neutro Abs 4.8  1.7 - 7.7 K/uL   Lymphocytes Relative 28  12 - 46 %   Lymphs Abs 2.0  0.7 - 4.0 K/uL   Monocytes Relative 5  3 - 12 %   Monocytes Absolute 0.4  0.1 -  1.0 K/uL   Eosinophils Relative 0  0 - 5 %   Eosinophils Absolute 0.0  0.0 - 0.7 K/uL   Basophils Relative 0  0 - 1 %   Basophils Absolute 0.0  0.0 - 0.1 K/uL  MAGNESIUM      Result Value Ref Range   Magnesium 2.6 (*) 1.5 - 2.5 mg/dL  PHOSPHORUS      Result Value Ref Range   Phosphorus 3.7  2.3 - 4.6 mg/dL  POC OCCULT BLOOD, ED      Result Value Ref Range   Fecal Occult Bld POSITIVE (*) NEGATIVE  I-STAT CHEM 8, ED      Result Value Ref Range   Sodium 143  137 - 147 mEq/L   Potassium 2.3 (*) 3.7 - 5.3 mEq/L   Chloride 105  96 - 112 mEq/L   BUN 19  6 - 23 mg/dL   Creatinine, Ser 0.90  0.50 - 1.10 mg/dL   Glucose, Bld 124 (*) 70 - 99 mg/dL   Calcium, Ion 1.22  1.12 - 1.23 mmol/L   TCO2 22  0 - 100 mmol/L   Hemoglobin 12.9  12.0 - 15.0 g/dL   HCT 38.0  36.0 - 46.0 %   Comment NOTIFIED PHYSICIAN    I-STAT CHEM 8, ED      Result Value Ref Range   Sodium 144  137 - 147 mEq/L   Potassium 2.5 (*) 3.7 - 5.3 mEq/L   Chloride 107  96 - 112 mEq/L   BUN 15  6 - 23 mg/dL   Creatinine, Ser 0.90  0.50 - 1.10 mg/dL   Glucose, Bld 167 (*) 70 - 99 mg/dL   Calcium, Ion 1.23  1.12 - 1.23 mmol/L   TCO2 20  0 - 100 mmol/L   Hemoglobin 11.6 (*) 12.0 - 15.0 g/dL   HCT 34.0 (*) 36.0 - 46.0 %   Comment NOTIFIED PHYSICIAN      Patient given 80 mEq of oral potassium and  40 mEq of IV potassium taking greater than 4 hours. After the patient patient's potassium still found to be only 2.5. Called Dr. Alcario Drought who consult on the patient for reevaluation as I feel the patient needs admission and at least needs a hemoglobin of 3 before discharge. Dr. Alcario Drought is in agreement. As with the with the patient who also agrees to the plan of care. Her vital signs are stable she appears safe for discharge  Patient / Family / Caregiver informed of clinical course, understand medical decision-making process, and agree with plan.  I personally reviewed the imaging tests through PACS system. I have reviewed and interpreted Lab values. I reviewed available ER/hospitalization records through the [place], [place] 07/27/14 4081

## 2014-07-26 NOTE — ED Notes (Addendum)
Critical Potassium Less than 2.2. Dr. Darl Householder made aware.

## 2014-07-26 NOTE — ED Provider Notes (Signed)
CSN: [id number]     Arrival date & time 07/26/14  1647 History   First MD Initiated Contact with Patient 07/26/14 1856     Chief Complaint  Patient presents with  . Rectal Bleeding     (Consider location/radiation/quality/duration/timing/severity/associated sxs/prior Treatment) The history is provided by the patient.  Tasha [name] is a 58 y.o. female hx of HTN, fibroids, here with blood in stool, abdominal pain. Diffuse abdominal pain and distention for 2 weeks. She is also constipated. Had history of hemorrhoids and had an episode of blood when she wiped a week ago. Has been constipated and heated up some ice cream and drank it. She had two bowel movements and then noticed more blood in her stool. No vomiting. Has abdominal cramps.    Past Medical History  Diagnosis Date  . Hypertension   . Fibroids   . Back pain, chronic   . Tumor associated pain   . Dizziness and giddiness 09/27/2013   Past Surgical History  Procedure Laterality Date  . Neck surgery      cadaver bones put in her neck  . Cholecystectomy    . Cesarean section     Family History  Problem Relation Age of Onset  . Hypertension Mother   . Cancer Mother   . Heart disease Father    History  Substance Use Topics  . Smoking status: Former Smoker    Quit date: 12/22/2008  . Smokeless tobacco: Never Used  . Alcohol Use: No     Comment: distant history of cocaine abuse, not active   OB History   Grav Para Term Preterm Abortions TAB SAB Ect Mult Living   4 1 1  3  2   1      Review of Systems  Gastrointestinal: Positive for abdominal pain, blood in stool, hematochezia and abdominal distention.  All other systems reviewed and are negative.     Allergies  Sulfa antibiotics  Home Medications   Prior to Admission medications   Medication Sig Start Date End Date Taking? Authorizing Provider  hydrochlorothiazide (HYDRODIURIL) 25 MG tablet Take 25 mg by mouth daily.     Historical Provider, MD  ibuprofen  (ADVIL,MOTRIN) 800 MG tablet Take 800 mg by mouth every 8 (eight) hours as needed. For pain     Historical Provider, MD  ZORVOLEX 35 MG CAPS 35 mg by Mouth Rinse route 3 (three) times daily as needed. 05/03/14   Historical Provider, MD   BP 115/81  Pulse 85  Temp(Src) 98.1 F (36.7 C) (Oral)  Resp 20  Ht 5\' 4"  (1.626 m)  Wt 225 lb (102.059 kg)  BMI 38.60 kg/m2  SpO2 100%  LMP 11/03/2012 Physical Exam  Nursing note and vitals reviewed. Constitutional: She is oriented to person, place, and time. She appears well-developed and well-nourished.  HENT:  Head: Normocephalic.  MM slightly dry   Eyes: Conjunctivae are normal. Pupils are equal, round, and reactive to light.  Neck: Normal range of motion. Neck supple.  Cardiovascular: Normal rate, regular rhythm and normal heart sounds.   Pulmonary/Chest: Effort normal and breath sounds normal. No respiratory distress. She has no wheezes. She has no rales.  Abdominal:  Distended, nontender. + BS   Genitourinary:  Rectal- ? Internal hemorrhoid, no active bleeding. Brown stool.   Musculoskeletal: Normal range of motion. She exhibits no edema and no tenderness.  Neurological: She is alert and oriented to person, place, and time. No cranial nerve deficit. Coordination normal.  Skin: Skin is  warm and dry.  Psychiatric: She has a normal mood and affect. Her behavior is normal. Judgment and thought content normal.    ED Course  Procedures (including critical care time) Labs Review Labs Reviewed  COMPREHENSIVE METABOLIC PANEL - Abnormal; Notable for the following:    Potassium <2.2 (*)    Glucose, Bld 115 (*)    GFR calc non Af Amer 79 (*)    Anion gap 17 (*)    All other components within normal limits  CBC WITH DIFFERENTIAL - Abnormal; Notable for the following:    HCT 35.5 (*)    All other components within normal limits  MAGNESIUM - Abnormal; Notable for the following:    Magnesium 2.6 (*)    All other components within normal limits   POC OCCULT BLOOD, ED - Abnormal; Notable for the following:    Fecal Occult Bld POSITIVE (*)    All other components within normal limits  I-STAT CHEM 8, ED - Abnormal; Notable for the following:    Potassium 2.3 (*)    Glucose, Bld 124 (*)    All other components within normal limits  URINALYSIS, ROUTINE W REFLEX MICROSCOPIC  PHOSPHORUS    Imaging Review Dg Abd 2 Views  07/26/2014   CLINICAL DATA:  Abdominal pain and distention  EXAM: ABDOMEN - 2 VIEW  COMPARISON:  None.  FINDINGS: A single mildly dilated loop of small bowel is identified over the left mid abdomen. There is no evidence of free air. No radio-opaque calculi or other significant radiographic abnormality is seen. Cholecystectomy clips are noted.  IMPRESSION: Single mildly dilated loops of left mid abdominal small bowel. This is nonspecific and could represent early/partial small bowel obstruction or focal ileus.   Electronically Signed   By: Conchita Paris M.D.   On: 07/26/2014 19:57     EKG Interpretation None      MDM   Final diagnoses:  None   Tasha [name] is a 58 y.o. female here with ab distention, possible GI bleed. Occ positive, brown stool. Hypokalemic to 2.3. Will add magnesium and phos levels. Will supplement potasium. Hypokalemia likely from HCTZ vs ileus. Will order ab xray. Will call GI regarding GI bleed. Hg stable so will not transfuse. Will likely need admission to medicine.   8:23 PM Talked to Dr. Carlean Purl, who will see patient in AM. Dr. Alcario Drought, hospitalist, evaluated patient and felt that she can get potassium loading in the ED. He didn't think she has partial SBO. I think likely ileus. Overnight PA will follow patient.    Tasha Arthurs, MD 07/26/14 2024

## 2014-07-26 NOTE — ED Notes (Signed)
Pt c/o bright red blood from rectum. Pt reports seeing blood in toilet bowl and with wiping. sts she noticed some streaks last week but didn't think much of it because she had a hemorrhoid but today she noticed a lot more blood so she became worried. sts her mother was dx with cancer after having similar symptoms so she wants to get checked out. C/o bilateral lower abd pain x 2 weeks. Denies use of blood thinners. Nad, skin warm and dry, resp e/u.

## 2014-07-26 NOTE — Consult Note (Signed)
[facility] Medical Consultation  EVAROSE ALTLAND YTK:[id number] DOB: [birth year] DOA: 07/26/2014 PCP: Salena Saner., MD   Requesting physician: Dr. Darl Householder Date of consultation: 07/26/14 Reason for consultation: Hypokalemia  Impression/Recommendations Principal Problem:   Hypokalemia Active Problems:   Constipation   BRBPR (bright red blood per rectum)    1. Hypokalemia - Hypokalemia found on labs in ED, patient wishes to avoid admission and feel that an attempt to avoid admission is reasonable since the only thing we have planned inpatient is potassium replacement.  Therefore will perform replacement in the ED: replacing with 40 meq IV, 40 PO now and 40 PO in 4 hours.  Patient also advised to stop HCTZ until she sees her doctor.  She should make follow up appointment within the next week. 2. Constipation - improved after 2 BMs so far today the first of which was "large" per patient.  X ray is reviewed but patients clinical history does not fit for worsening ileus or SBO with the large BMs today and lack of vomiting.  Recommend she use a more traditional laxative for further constipation.  As I suspect the lactose from the ice cream may have helped cause the hypokalemia. 3. BRBPR - small amount of blood with large BM today, suspect internal hemorrhoid.  HGB stable.  Does not appear to be a significant GIB at this time.  Likely okay to be treated as outpatient, patient should follow up with GI.   Chief Complaint: Rectal bleeding  HPI:  58 yo F with h/o HTN, fibroids, here with blood in stool, abdominal pain, and constipation.  She has had diffuse abdominal pain and distention for the past 2 weeks and was severely constipated she states.  She also has a history of hemorrhoids in the past.  She had an episode of blood when she wiped one week ago.  Because she was constipated today she heated up some ice cream and drank it.  This worked for her constipation and she had 2 large bowel movements  and noticed some blood in her stool at that time.  Review of Systems:  No vomiting, some abdominal cramps, 12 systems reviewed and otherwise negative.  Past Medical History  Diagnosis Date  . Hypertension   . Fibroids   . Back pain, chronic   . Tumor associated pain   . Dizziness and giddiness 09/27/2013   Past Surgical History  Procedure Laterality Date  . Neck surgery      cadaver bones put in her neck  . Cholecystectomy    . Cesarean section     Social History:  reports that she quit smoking about 5 years ago. She has never used smokeless tobacco. She reports that she uses illicit drugs (Marijuana) about twice per week. She reports that she does not drink alcohol.  Allergies  Allergen Reactions  . Sulfa Antibiotics Itching   Family History  Problem Relation Age of Onset  . Hypertension Mother   . Cancer Mother   . Heart disease Father     Prior to Admission medications   Medication Sig Start Date End Date Taking? Authorizing Provider  hydrochlorothiazide (HYDRODIURIL) 25 MG tablet Take 25 mg by mouth daily.     Historical Provider, MD  ibuprofen (ADVIL,MOTRIN) 800 MG tablet Take 800 mg by mouth every 8 (eight) hours as needed. For pain     Historical Provider, MD  ZORVOLEX 35 MG CAPS 35 mg by Mouth Rinse route 3 (three) times daily as needed. 05/03/14   Historical  Provider, MD   Physical Exam: Blood pressure 115/81, pulse 85, temperature 98.1 F (36.7 C), temperature source Oral, resp. rate 20, height 5\' 4"  (1.626 m), weight 102.059 kg (225 lb), last menstrual period 11/03/2012, SpO2 100.00%. Filed Vitals:   07/26/14 1905  BP: 115/81  Pulse: 85  Temp:   Resp: 20    General:  NAD, resting comfortably in bed Eyes: PEERLA EOMI ENT: mucous membranes moist Neck: supple w/o JVD Cardiovascular: RRR w/o MRG Respiratory: CTA B Abdomen: soft, nt, nd, bs+ Skin: no rash nor lesion Musculoskeletal: MAE, full ROM all 4 extremities Psychiatric: normal tone and  affect Neurologic: AAOx3, grossly non-focal  Labs on Admission:  Basic Metabolic Panel:  Recent Labs Lab 07/26/14 1727 07/26/14 1915 07/26/14 1929  NA 142  --  143  K <2.2*  --  2.3*  CL 104  --  105  CO2 21  --   --   GLUCOSE 115*  --  124*  BUN 19  --  19  CREATININE 0.81  --  0.90  CALCIUM 9.4  --   --   MG  --  2.6*  --   PHOS  --  3.7  --    Liver Function Tests:  Recent Labs Lab 07/26/14 1727  AST 16  ALT 22  ALKPHOS 74  BILITOT 0.6  PROT 7.7  ALBUMIN 3.7   No results found for this basename: LIPASE, AMYLASE,  in the last 168 hours No results found for this basename: AMMONIA,  in the last 168 hours CBC:  Recent Labs Lab 07/26/14 1727 07/26/14 1929  WBC 7.3  --   NEUTROABS 4.8  --   HGB 12.2 12.9  HCT 35.5* 38.0  MCV 87.7  --   PLT 269  --    Cardiac Enzymes: No results found for this basename: CKTOTAL, CKMB, CKMBINDEX, TROPONINI,  in the last 168 hours BNP: No components found with this basename: POCBNP,  CBG: No results found for this basename: GLUCAP,  in the last 168 hours  Radiological Exams on Admission: Dg Abd 2 Views  07/26/2014   CLINICAL DATA:  Abdominal pain and distention  EXAM: ABDOMEN - 2 VIEW  COMPARISON:  None.  FINDINGS: A single mildly dilated loop of small bowel is identified over the left mid abdomen. There is no evidence of free air. No radio-opaque calculi or other significant radiographic abnormality is seen. Cholecystectomy clips are noted.  IMPRESSION: Single mildly dilated loops of left mid abdominal small bowel. This is nonspecific and could represent early/partial small bowel obstruction or focal ileus.   Electronically Signed   By: Conchita Paris M.D.   On: 07/26/2014 19:57    EKG: Independently reviewed.  Time spent: 60 min  GARDNER, JARED M. [facility] Pager [phone]  If 7PM-7AM, please contact night-coverage [email or web address] Password [email or web address] 07/26/2014, 8:30 PM

## 2014-07-26 NOTE — ED Notes (Signed)
Attempted IV access x2.  ?

## 2014-07-26 NOTE — ED Notes (Signed)
Pt remains in Xray  

## 2014-07-27 ENCOUNTER — Encounter (HOSPITAL_COMMUNITY): Payer: Self-pay | Admitting: Physician Assistant

## 2014-07-27 DIAGNOSIS — I10 Essential (primary) hypertension: Secondary | ICD-10-CM

## 2014-07-27 LAB — I-STAT CHEM 8, ED
BUN: 15 mg/dL (ref 6–23)
CALCIUM ION: 1.23 mmol/L (ref 1.12–1.23)
Chloride: 107 mEq/L (ref 96–112)
Creatinine, Ser: 0.9 mg/dL (ref 0.50–1.10)
GLUCOSE: 167 mg/dL — AB (ref 70–99)
HEMATOCRIT: 34 % — AB (ref 36.0–46.0)
HEMOGLOBIN: 11.6 g/dL — AB (ref 12.0–15.0)
Potassium: 2.5 mEq/L — CL (ref 3.7–5.3)
Sodium: 144 mEq/L (ref 137–147)
TCO2: 20 mmol/L (ref 0–100)

## 2014-07-27 LAB — BASIC METABOLIC PANEL
ANION GAP: 11 (ref 5–15)
BUN: 13 mg/dL (ref 6–23)
CALCIUM: 8.2 mg/dL — AB (ref 8.4–10.5)
CHLORIDE: 112 meq/L (ref 96–112)
CO2: 20 mEq/L (ref 19–32)
CREATININE: 0.76 mg/dL (ref 0.50–1.10)
GFR calc Af Amer: >90 mL/min (ref >90)
Glucose, Bld: 110 mg/dL — ABNORMAL HIGH (ref 70–99)
Potassium: 3.3 mEq/L — ABNORMAL LOW (ref 3.7–5.3)
Sodium: 143 mEq/L (ref 137–147)

## 2014-07-27 MED ORDER — POTASSIUM CHLORIDE 10 MEQ/100ML IV SOLN
10.0000 meq | Freq: Once | INTRAVENOUS | Status: DC
Start: 2014-07-27 — End: 2014-07-27
  Administered 2014-07-27: 10 meq via INTRAVENOUS

## 2014-07-27 MED ORDER — SENNA 8.6 MG PO TABS
2.0000 | ORAL_TABLET | Freq: Every day | ORAL | Status: DC
  Administered 2014-07-27: 17.2 mg via ORAL
  Filled 2014-07-27: qty 2

## 2014-07-27 MED ORDER — POLYETHYLENE GLYCOL 3350 17 G PO PACK: 17.0000 g | PACK | Freq: Every day | ORAL | Status: DC

## 2014-07-27 MED ORDER — IBUPROFEN 800 MG PO TABS
800.0000 mg | ORAL_TABLET | Freq: Three times a day (TID) | ORAL | Status: DC | PRN
  Administered 2014-07-27: 800 mg via ORAL
  Filled 2014-07-27 (×2): qty 1

## 2014-07-27 MED ORDER — POTASSIUM CHLORIDE 10 MEQ/100ML IV SOLN
10.0000 meq | INTRAVENOUS | Status: DC
  Administered 2014-07-27 (×3): 10 meq via INTRAVENOUS
  Filled 2014-07-27 (×6): qty 100

## 2014-07-27 MED ORDER — SODIUM CHLORIDE 0.9 % IJ SOLN: 3.0000 mL | Freq: Two times a day (BID) | INTRAMUSCULAR | Status: DC

## 2014-07-27 MED ORDER — POTASSIUM CHLORIDE CRYS ER 20 MEQ PO TBCR
40.0000 meq | EXTENDED_RELEASE_TABLET | Freq: Once | ORAL | Status: AC
  Administered 2014-07-27: 40 meq via ORAL
  Filled 2014-07-27: qty 2

## 2014-07-27 MED ORDER — SODIUM CHLORIDE 0.9 % IV SOLN: INTRAVENOUS | Status: DC

## 2014-07-27 MED ORDER — POLYETHYLENE GLYCOL 3350 17 G PO PACK
17.0000 g | PACK | Freq: Every day | ORAL | Status: DC
  Administered 2014-07-27: 17 g via ORAL
  Filled 2014-07-27: qty 1

## 2014-07-27 MED ORDER — SENNA 8.6 MG PO TABS: 2.0000 | ORAL_TABLET | Freq: Every day | ORAL | Status: DC

## 2014-07-27 MED ORDER — DICLOFENAC SODIUM 50 MG PO TBEC
50.0000 mg | DELAYED_RELEASE_TABLET | Freq: Three times a day (TID) | ORAL | Status: DC | PRN
  Filled 2014-07-27: qty 1

## 2014-07-27 NOTE — Progress Notes (Signed)
After 80 meq PO and 40 meq IV, K has only come up to 2.5, will go ahead and get patient admitted for additional IV runs.

## 2014-07-27 NOTE — Progress Notes (Signed)
Educated Pt on diagnosis, medications and discharge instructions. Pt given handouts.  Pt educated on diet and follow appointments as well.  Pt verbalized understanding. Teach back method used during education.

## 2014-07-27 NOTE — Progress Notes (Signed)
Telemetry box 11 removed and returned, CCMD notified

## 2014-07-27 NOTE — ED Provider Notes (Signed)
Medical screening examination/treatment/procedure(s) were performed by non-physician practitioner and as supervising physician I was immediately available for consultation/collaboration.   EKG Interpretation   Date/Time:  Wednesday July 26 2014 20:35:19 EDT Ventricular Rate:  64 PR Interval:  258 QRS Duration: 108 QT Interval:  417 QTC Calculation: 430 R Axis:   49 Text Interpretation:  Sinus rhythm Prolonged PR interval Abnormal R-wave  progression, early transition PR prolonged compared to previous tracing   Confirmed by YAO  MD, DAVID ([id number]) on 07/26/2014 10:15:21 PM        Wandra Arthurs, MD 07/27/14 1059

## 2014-07-27 NOTE — Discharge Summary (Addendum)
Physician Discharge Summary  Tasha [name] TKW:[id number] DOB: 28-Jan-1956 DOA: 07/26/2014  PCP: Salena Saner., MD Primary GI: Dr. Juanita Craver   Admit date: 07/26/2014 Discharge date: 07/27/2014  Time spent: Less than 30 minutes  Recommendations for Outpatient Follow-up:  1. Dr. Willey Blade, PCP in 4 days with repeat labs (CBC & BMP). Will need evaluation of anti HTN meds 2. Dr. Juanita Craver, GI for screening colonoscopy consideration and evaluation of mild rectal bleed.  Discharge Diagnoses:  Principal Problem:   Hypokalemia Active Problems:   Constipation   BRBPR (bright red blood per rectum)   Discharge Condition: Improved & Stable  Diet recommendation: Heart Healthy diet.  Filed Weights   07/26/14 1711 07/27/14 0311  Weight: 102.059 kg (225 lb) 100.7 kg (222 lb 0.1 oz)    History of present illness & Hospital Course:  58 y/o female with history of chronic LBP, on chronic NSAID's, chronic constipation, screening colonoscopy ~ 10 years ago by Dr. Collene Mares apparently showed benign polyps & fibroids presented to the ED on 07/26/14 with complaints of abdominal pain, constipation and mild rectal bleeding. She states that her constipation got worse about 2 weeks ago which she attributes to NSAID's. No narcotics on her medication list. She does give h/o hemorrhoids. A week ago she noticed some blood on tissue wipe a week ago. Yesterday she tried some home remedy for her constipation and drank some heated ice cream following which she had a large BM- initial stools were hard followed by soft stools. She saw mild blood on tissue wipe and in toilet bowel. She then had a smaller BM without blood or malena. Her daughter advised her to come to the ED. In the ED, she was found to be profoundly hypokalemic and Abdominal XRay showed isolated mildly dilated SB loop.   Patients abdominal pain was mostly likely due to constipation and resolved after BM's. She has tolerated diet. Bowel regimen started  and may consider checking TSH as OP if not already done. Her presentation was not consistent with SBO or ileus. Mild rectal bleed was likely from constipation and hemorrhoids. This has resolved. Hb is stable. She is advised to follow up with GI for further evaluation and possible screening colonoscopy. Severe hypokalemia is likely from HCTZ. Other DD - ? NSAID related. Held HCTZ. Hypokalemia was aggressively replaced and improved. Mg normal. Recommend repeating BMP in a couple of days and treating as needed. If persistently low, may need to DC NSAID's all together and get a Nephrology consultation. HTN: BP's normal-soft. HCTZ held due to severe hypokalemia. Follow up OP and consider alternate agent.  Consultations:  None  Procedures:  None    Discharge Exam:  Complaints:  Denies abdominal distension or pain, nausea, vomiting or rectal bleeding since admission. Tolerating diet. No Bm's. Flatus +.  Filed Vitals:   07/27/14 0230 07/27/14 0311 07/27/14 0621 07/27/14 1001  BP:  128/85 105/45 110/68  Pulse: 64 72 81 71  Temp:  98.2 F (36.8 C) 98.5 F (36.9 C) 97.9 F (36.6 C)  TempSrc:  Oral Oral Oral  Resp: 15 18 18 18   Height:  5\' 4"  (1.626 m)    Weight:  100.7 kg (222 lb 0.1 oz)    SpO2: 100% 100% 99% 97%    General exam: Pleasant middle aged female lying comfortably in bed. Respiratory system: Clear. No increased work of breathing. Cardiovascular system: S1 & S2 heard, RRR. No JVD, murmurs, gallops, clicks or pedal edema. Tele: SR Gastrointestinal system: Abdomen  is nondistended, soft and nontender. Normal bowel sounds heard. Central nervous system: Alert and oriented. No focal neurological deficits. Extremities: Symmetric 5 x 5 power.  Discharge Instructions      Discharge Instructions   Call MD for:  severe uncontrolled pain    Complete by:  As directed      Call MD for:    Complete by:  As directed   Rectal bleeding.     Diet - low sodium heart healthy    Complete  by:  As directed      Increase activity slowly    Complete by:  As directed             Medication List    STOP taking these medications       hydrochlorothiazide 25 MG tablet  Commonly known as:  HYDRODIURIL     ZORVOLEX 35 MG Caps  Generic drug:  Diclofenac      TAKE these medications       ibuprofen 800 MG tablet  Commonly known as:  ADVIL,MOTRIN  Take 800 mg by mouth every 8 (eight) hours as needed. For pain     polyethylene glycol packet  Commonly known as:  MIRALAX / GLYCOLAX  Take 17 g by mouth daily.     senna 8.6 MG Tabs tablet  Commonly known as:  SENOKOT  Take 2 tablets (17.2 mg total) by mouth daily.       Follow-up Information   Follow up with Salena Saner., MD. Schedule an appointment as soon as possible for a visit in 4 days. (To be seen with repeat labs (CBC & BMP). Address BP medications.)    Specialty:  Internal Medicine   Contact information:   [address] [place] [address] [phone]       Schedule an appointment as soon as possible for a visit with Juanita Craver, MD.   Specialty:  Gastroenterology   Contact information:   [address], [place] [address] [phone]        The results of significant diagnostics from this hospitalization (including imaging, microbiology, ancillary and laboratory) are listed below for reference.    Significant Diagnostic Studies: Mr Lumbar Spine W Wo Contrast  07/12/2014   CLINICAL DATA:  Follow-up of spinal cord ependymoma. Right leg pain.  BUN and creatinine were obtained on site at [facility] at  [address]  Results:  BUN 23 mg/dL,  Creatinine 0.8 mg/dL.  EXAM: MRI LUMBAR SPINE WITHOUT AND WITH CONTRAST  TECHNIQUE: Multiplanar and multiecho pulse sequences of the lumbar spine were obtained without and with intravenous contrast.  CONTRAST:  68mL MULTIHANCE GADOBENATE DIMEGLUMINE 529 MG/ML IV SOLN  COMPARISON:  MRIs dated 11/10/2013 and  08/19/2009  FINDINGS: There is a 7 mm spherical intradural mass at the L2 level at the filum terminale consistent with an ependymoma. There is no appreciable enhancement of the lesion after contrast administration.  The discs from T11-12 through L3-4 are normal.  L4-5: There is severe bilateral facet arthritis with bilateral joint effusions. No spondylolisthesis. No significant bulging of the disc. No neural impingement.  L5-S1: The disc is normal. Slight degenerative changes of the facet joints, stable.  IMPRESSION: 1. 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 2. 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   Electronically Signed   By: Rozetta Nunnery M.D.   On: 07/12/2014 15:04   Dg Abd 2 Views  07/26/2014  CLINICAL DATA:  Abdominal pain and distention  EXAM: ABDOMEN - 2 VIEW  COMPARISON:  None.  FINDINGS: A single mildly dilated loop of small bowel is identified over the left mid abdomen. There is no evidence of free air. No radio-opaque calculi or other significant radiographic abnormality is seen. Cholecystectomy clips are noted.  IMPRESSION: Single mildly dilated loops of left mid abdominal small bowel. This is nonspecific and could represent early/partial small bowel obstruction or focal ileus.   Electronically Signed   By: Conchita Paris M.D.   On: 07/26/2014 19:57    Microbiology: No results found for this or any previous visit (from the past 240 hour(s)).   Labs: Basic Metabolic Panel:  Recent Labs Lab 07/26/14 1727 07/26/14 1915 07/26/14 1929 07/27/14 0145 07/27/14 1214  NA 142  --  143 144 143  K <2.2*  --  2.3* 2.5* 3.3*  CL 104  --  105 107 112  CO2 21  --   --   --  20  GLUCOSE 115*  --  124* 167* 110*  BUN 19  --  19 15 13   CREATININE 0.81  --  0.90 0.90 0.76  CALCIUM 9.4  --   --   --  8.2*  MG  --  2.6*  --   --   --   PHOS  --  3.7  --   --   --    Liver Function Tests:  Recent Labs Lab 07/26/14 1727  AST 16  ALT 22   ALKPHOS 74  BILITOT 0.6  PROT 7.7  ALBUMIN 3.7   No results found for this basename: LIPASE, AMYLASE,  in the last 168 hours No results found for this basename: AMMONIA,  in the last 168 hours CBC:  Recent Labs Lab 07/26/14 1727 07/26/14 1929 07/27/14 0145  WBC 7.3  --   --   NEUTROABS 4.8  --   --   HGB 12.2 12.9 11.6*  HCT 35.5* 38.0 34.0*  MCV 87.7  --   --   PLT 269  --   --    Cardiac Enzymes: No results found for this basename: CKTOTAL, CKMB, CKMBINDEX, TROPONINI,  in the last 168 hours BNP: BNP (last 3 results) No results found for this basename: PROBNP,  in the last 8760 hours CBG: No results found for this basename: GLUCAP,  in the last 168 hours  Additional labs: 1. FOBT +   Signed:  Vernell Leep, MD, FACP, FHM. [facility] Pager [phone]  If 7PM-7AM, please contact night-coverage [email or web address] Password [email or web address] 07/27/2014, 2:10 PM

## 2014-07-27 NOTE — Progress Notes (Signed)
UR completed 

## 2014-07-27 NOTE — Progress Notes (Signed)
Patient Discharge:  Disposition: Discharged home  Education: diagnosis, medications, diet, follow up appointments, all discharge instructions.   IV: L hand IV discontinued  Telemetry: Removed  Follow-up appointments: Informed pt on follow up appoints. Pt stated she will make appointments.  Prescriptions: Scripts sent to patients pharmacy   Transportation: Friend will transport pt home via car  Belongings:Cell phone, clothing, purse and all other belonging taken with pt.

## 2014-08-09 ENCOUNTER — Encounter: Payer: Self-pay | Admitting: Obstetrics & Gynecology

## 2014-08-09 ENCOUNTER — Ambulatory Visit (INDEPENDENT_AMBULATORY_CARE_PROVIDER_SITE_OTHER): Payer: BLUE CROSS/BLUE SHIELD | Admitting: Obstetrics & Gynecology

## 2014-08-09 VITALS — BP 143/83 | HR 71 | Temp 97.2°F | Ht 64.0 in | Wt 236.0 lb

## 2014-08-09 DIAGNOSIS — B009 Herpesviral infection, unspecified: Secondary | ICD-10-CM

## 2014-08-09 MED ORDER — VALACYCLOVIR HCL 500 MG PO TABS: 500.0000 mg | ORAL_TABLET | Freq: Two times a day (BID) | ORAL | Status: DC

## 2014-08-09 MED ORDER — AZITHROMYCIN 250 MG PO TABS: 1000.0000 mg | ORAL_TABLET | Freq: Once | ORAL | Status: DC

## 2014-08-09 NOTE — Progress Notes (Signed)
Patient ID: Tasha [name], female   DOB: [birth year], 58 y.o.   MRN: [id number]  Chief Complaint  Patient presents with  . Vaginal Bump    slight pain, noticed 2-3 days ago.     HPI Tasha [name] is a 58 y.o. female.   Vaginal Pain The patient's primary symptoms include genital lesions. This is a recurrent problem. The current episode started in the past 7 days. The problem has been unchanged. The pain is moderate. The problem affects the right side. She has tried warm baths for the symptoms. No, her partner does not have an STD. Her past medical history is significant for herpes simplex.    Past Medical History  Diagnosis Date  . Hypertension   . Fibroids   . DDD (degenerative disc disease)     DJD.  spine.  chronic pain.   Marland Kitchen Pyelonephritis 2004  . Vertigo 2014    positional.  Eval with Dr Jannifer [place], neuro, 2014.     Past Surgical History  Procedure Laterality Date  . Neck surgery      cadaver bones put in her neck  . Cholecystectomy  2001  . Cesarean section    . Ercp w/ sphicterotomy  2001    removal CBD stones  . 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     Family History  Problem Relation Age of Onset  . Hypertension Mother   . Cancer Mother   . Heart disease Father     Social History History  Substance Use Topics  . Smoking status: Former Smoker    Quit date: 12/22/2008  . Smokeless tobacco: Never Used  . 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 Sulfa Antibiotics Itching    Current Outpatient Prescriptions  Medication Sig Dispense Refill  . ibuprofen (ADVIL,MOTRIN) 800 MG tablet Take 800 mg by mouth every 8 (eight) hours as needed. For pain       . senna (SENOKOT) 8.6 MG TABS tablet Take 2 tablets (17.2 mg total) by mouth daily.  60 each  0  . azithromycin (ZITHROMAX) 250 MG tablet Take 4 tablets (1,000 mg total) by  mouth once.  4 tablet  0  . valACYclovir (VALTREX) 500 MG tablet Take 1 tablet (500 mg total) by mouth 2 (two) times daily. For 3 days  30 tablet  2   No current facility-administered medications for this visit.    Review of Systems Review of Systems  Genitourinary: Positive for vaginal pain.   Constitutional: negative for fatigue and weight loss Respiratory: negative for cough and wheezing Cardiovascular: negative for chest pain, fatigue and palpitations Gastrointestinal: negative for abdominal pain and change in bowel habits Integument/breast: negative for nipple discharge Musculoskeletal:negative for myalgias Neurological: negative for gait problems and tremors Behavioral/Psych: negative for abusive relationship, depression Endocrine: negative for temperature intolerance     Blood pressure 143/83, pulse 71, temperature 97.2 F (36.2 C), height 5\' 4"  (1.626 m), weight 107.049 kg (236 lb), last menstrual period 11/03/2012.  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 azithromycin (ZITHROMAX) 250 MG tablet    Sig: Take 4 tablets (1,000 mg total) by mouth once.    Dispense:  4 tablet    Refill:  0  . valACYclovir (VALTREX) 500 MG tablet    Sig: Take 1 tablet (500 mg total) by mouth 2 (two) times daily. For 3 days    Dispense:  30 tablet    Refill:  2    Please make pt aware to take for 3 days during outbreak.    Possible management options include: keep lesion clean; OTC analgesics Follow up as needed.         JACKSON-MOORE,Tasha [name] A 08/09/2014, 8:48 PM

## 2014-08-09 NOTE — Patient Instructions (Signed)
Genital Herpes °Genital herpes is a sexually transmitted disease. This means that it is a disease passed by having sex with an infected person. There is no cure for genital herpes. The time between attacks can be months to years. The virus may live in a person but produce no problems (symptoms). This infection can be passed to a baby as it travels down the birth canal (vagina). In a newborn, this can cause central nervous system damage, eye damage, or even death. The virus that causes genital herpes is usually HSV-2 virus. The virus that causes oral herpes is usually HSV-1. The diagnosis (learning what is wrong) is made through culture results. °SYMPTOMS  °Usually symptoms of pain and itching begin a few days to a week after contact. It first appears as small blisters that progress to small painful ulcers which then scab over and heal after several days. It affects the outer genitalia, birth canal, cervix, penis, anal area, buttocks, and thighs. °HOME CARE INSTRUCTIONS  °· Keep ulcerated areas dry and clean. °· Take medications as directed. Antiviral medications can speed up healing. They will not prevent recurrences or cure this infection. These medications can also be taken for suppression if there are frequent recurrences. °· While the infection is active, it is contagious. Avoid all sexual contact during active infections. °· Condoms may help prevent spread of the herpes virus. °· Practice safe sex. °· Wash your hands thoroughly after touching the genital area. °· Avoid touching your eyes after touching your genital area. °· Inform your caregiver if you have had genital herpes and become pregnant. It is your responsibility to insure a safe outcome for your baby in this pregnancy. °· Only take over-the-counter or prescription medicines for pain, discomfort, or fever as directed by your caregiver. °SEEK MEDICAL CARE IF:  °· You have a recurrence of this infection. °· You do not respond to medications and are not  improving. °· You have new sources of pain or discharge which have changed from the original infection. °· You have an oral temperature above 102° F (38.9° C). °· You develop abdominal pain. °· You develop eye pain or signs of eye infection. °Document Released: 12/05/2000 Document Revised: 03/01/2012 Document Reviewed: 12/26/2009 °ExitCare® Patient Information ©2015 [employer]. This information is not intended to replace advice given to you by your health care provider. Make sure you discuss any questions you have with your health care provider. ° °

## 2014-08-24 ENCOUNTER — Ambulatory Visit: Payer: BLUE CROSS/BLUE SHIELD | Admitting: Obstetrics & Gynecology

## 2014-09-01 ENCOUNTER — Telehealth: Payer: Self-pay | Admitting: *Deleted

## 2014-09-01 NOTE — Telephone Encounter (Signed)
Tried contact patient regarding lab results on 08/31/2014 at 6:36pm. LM on VM to CB. Patient called back today for results.  CB: 5:29pm, Notified patient of lab results.

## 2014-09-04 ENCOUNTER — Other Ambulatory Visit: Payer: Self-pay | Admitting: Family

## 2014-09-04 DIAGNOSIS — I2699 Other pulmonary embolism without acute cor pulmonale: Secondary | ICD-10-CM

## 2014-09-08 ENCOUNTER — Other Ambulatory Visit: Payer: BC Managed Care – PPO

## 2014-09-12 ENCOUNTER — Inpatient Hospital Stay: Admission: RE | Admit: 2014-09-12 | Payer: BC Managed Care – PPO | Source: Ambulatory Visit

## 2014-09-19 ENCOUNTER — Ambulatory Visit
Admission: RE | Admit: 2014-09-19 | Discharge: 2014-09-19 | Disposition: A | Discharge status: Home / Self Care | Payer: BC Managed Care – PPO | Source: Ambulatory Visit | Attending: Family | Admitting: Family

## 2014-09-19 DIAGNOSIS — I2699 Other pulmonary embolism without acute cor pulmonale: Secondary | ICD-10-CM

## 2014-09-21 ENCOUNTER — Ambulatory Visit: Payer: BLUE CROSS/BLUE SHIELD | Admitting: Obstetrics & Gynecology

## 2014-10-23 ENCOUNTER — Encounter: Payer: Self-pay | Admitting: Obstetrics & Gynecology

## 2014-12-04 ENCOUNTER — Emergency Department (HOSPITAL_COMMUNITY)
Admission: EM | Admit: 2014-12-04 | Discharge: 2014-12-04 | Discharge status: Against Medical Advice | Payer: BC Managed Care – PPO | Attending: Emergency Medicine | Admitting: Emergency Medicine

## 2014-12-04 ENCOUNTER — Encounter (HOSPITAL_COMMUNITY): Payer: Self-pay | Admitting: Emergency Medicine

## 2014-12-04 DIAGNOSIS — Y998 Other external cause status: Secondary | ICD-10-CM | POA: Insufficient documentation

## 2014-12-04 DIAGNOSIS — S0990XA Unspecified injury of head, initial encounter: Secondary | ICD-10-CM | POA: Insufficient documentation

## 2014-12-04 DIAGNOSIS — W07XXXA Fall from chair, initial encounter: Secondary | ICD-10-CM | POA: Insufficient documentation

## 2014-12-04 DIAGNOSIS — Y9289 Other specified places as the place of occurrence of the external cause: Secondary | ICD-10-CM | POA: Diagnosis not present

## 2014-12-04 DIAGNOSIS — I10 Essential (primary) hypertension: Secondary | ICD-10-CM | POA: Diagnosis not present

## 2014-12-04 DIAGNOSIS — Y9389 Activity, other specified: Secondary | ICD-10-CM | POA: Diagnosis not present

## 2014-12-04 NOTE — ED Notes (Signed)
Pt st's last Thursday she started to sit down and missed the chair fall backwards and hitting her head and back of her neck.  Pt st's she then she has had sharp pains going through her head but denies headache. Pt alert and oriented x's 3.  Pt is also light sensitive

## 2014-12-05 ENCOUNTER — Emergency Department (HOSPITAL_COMMUNITY)
Admission: EM | Admit: 2014-12-05 | Discharge: 2014-12-05 | Disposition: A | Discharge status: Home / Self Care | Payer: BC Managed Care – PPO | Attending: Emergency Medicine | Admitting: Emergency Medicine

## 2014-12-05 ENCOUNTER — Encounter (HOSPITAL_COMMUNITY): Payer: Self-pay | Admitting: *Deleted

## 2014-12-05 ENCOUNTER — Emergency Department (HOSPITAL_COMMUNITY): Payer: BC Managed Care – PPO

## 2014-12-05 DIAGNOSIS — Z87448 Personal history of other diseases of urinary system: Secondary | ICD-10-CM | POA: Diagnosis not present

## 2014-12-05 DIAGNOSIS — Y9289 Other specified places as the place of occurrence of the external cause: Secondary | ICD-10-CM | POA: Diagnosis not present

## 2014-12-05 DIAGNOSIS — Z79899 Other long term (current) drug therapy: Secondary | ICD-10-CM | POA: Insufficient documentation

## 2014-12-05 DIAGNOSIS — Z9009 Acquired absence of other part of head and neck: Secondary | ICD-10-CM | POA: Insufficient documentation

## 2014-12-05 DIAGNOSIS — M545 Low back pain: Secondary | ICD-10-CM | POA: Diagnosis not present

## 2014-12-05 DIAGNOSIS — S199XXA Unspecified injury of neck, initial encounter: Secondary | ICD-10-CM | POA: Diagnosis present

## 2014-12-05 DIAGNOSIS — Y9389 Activity, other specified: Secondary | ICD-10-CM | POA: Insufficient documentation

## 2014-12-05 DIAGNOSIS — Z9089 Acquired absence of other organs: Secondary | ICD-10-CM | POA: Diagnosis not present

## 2014-12-05 DIAGNOSIS — D446 Neoplasm of uncertain behavior of carotid body: Secondary | ICD-10-CM | POA: Diagnosis not present

## 2014-12-05 DIAGNOSIS — Z87891 Personal history of nicotine dependence: Secondary | ICD-10-CM | POA: Diagnosis not present

## 2014-12-05 DIAGNOSIS — S0990XA Unspecified injury of head, initial encounter: Secondary | ICD-10-CM | POA: Diagnosis not present

## 2014-12-05 DIAGNOSIS — Y998 Other external cause status: Secondary | ICD-10-CM | POA: Insufficient documentation

## 2014-12-05 DIAGNOSIS — W01190A Fall on same level from slipping, tripping and stumbling with subsequent striking against furniture, initial encounter: Secondary | ICD-10-CM | POA: Insufficient documentation

## 2014-12-05 DIAGNOSIS — I10 Essential (primary) hypertension: Secondary | ICD-10-CM | POA: Insufficient documentation

## 2014-12-05 DIAGNOSIS — M542 Cervicalgia: Secondary | ICD-10-CM

## 2014-12-05 DIAGNOSIS — W19XXXA Unspecified fall, initial encounter: Secondary | ICD-10-CM

## 2014-12-05 LAB — I-STAT CHEM 8, ED
BUN: 13 mg/dL (ref 6–23)
CALCIUM ION: 1.2 mmol/L (ref 1.12–1.23)
CHLORIDE: 105 meq/L (ref 96–112)
Creatinine, Ser: 0.7 mg/dL (ref 0.50–1.10)
GLUCOSE: 100 mg/dL — AB (ref 70–99)
HEMATOCRIT: 38 % (ref 36.0–46.0)
Hemoglobin: 12.9 g/dL (ref 12.0–15.0)
Potassium: 3.4 mEq/L — ABNORMAL LOW (ref 3.7–5.3)
Sodium: 139 mEq/L (ref 137–147)
TCO2: 24 mmol/L (ref 0–100)

## 2014-12-05 MED ORDER — DIAZEPAM 5 MG PO TABS: 5.0000 mg | ORAL_TABLET | Freq: Three times a day (TID) | ORAL | Status: DC | PRN

## 2014-12-05 MED ORDER — IOHEXOL 350 MG/ML SOLN
50.0000 mL | Freq: Once | INTRAVENOUS | Status: AC | PRN
  Administered 2014-12-05: 50 mL via INTRAVENOUS

## 2014-12-05 MED ORDER — HYDROCODONE-ACETAMINOPHEN 5-325 MG PO TABS
2.0000 | ORAL_TABLET | Freq: Once | ORAL | Status: AC
  Administered 2014-12-05: 2 via ORAL
  Filled 2014-12-05: qty 2

## 2014-12-05 MED ORDER — DIAZEPAM 5 MG PO TABS
5.0000 mg | ORAL_TABLET | Freq: Once | ORAL | Status: AC
  Administered 2014-12-05: 5 mg via ORAL
  Filled 2014-12-05: qty 1

## 2014-12-05 MED ORDER — HYDROCODONE-ACETAMINOPHEN 5-325 MG PO TABS: 1.0000 | ORAL_TABLET | Freq: Four times a day (QID) | ORAL | Status: DC | PRN

## 2014-12-05 NOTE — ED Provider Notes (Signed)
CSN: [id number]     Arrival date & time 12/05/14  1157 History   First MD Initiated Contact with Patient 12/05/14 1217     Chief Complaint  Patient presents with  . Fall  . Headache     (Consider location/radiation/quality/duration/timing/severity/associated sxs/prior Treatment) HPI Comments: Patient presents to the emergency department with chief complaint of left-sided neck pain. She states that approximately 1 week ago she fell backward and whiplash herself as she fell. She complains of hitting her head on a chair also. She denies any LOC. Denies any vomiting. Denies any weakness, numbness, or tingling of the extremities. She states that she wanted to be evaluated because her symptoms or not improving. She has a history of C-spine surgery. She has tried taking OTC medications for her pain with no relief. Pain is worsened with movement and palpation. She denies any fevers, chills, chest pain, shortness of breath.  The history is provided by the patient. No language interpreter was used.    Past Medical History  Diagnosis Date  . Hypertension   . Fibroids   . DDD (degenerative disc disease)     DJD.  spine.  chronic pain.   Marland Kitchen Pyelonephritis 2004  . Vertigo 2014    positional.  Eval with Dr Jannifer [place], neuro, 2014.    Past Surgical History  Procedure Laterality Date  . Neck surgery      cadaver bones put in her neck  . Cholecystectomy  2001  . Cesarean section    . Ercp w/ sphicterotomy  2001    removal CBD stones  . Skin biopsy  12/2009, 04/2840    3244 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    Family History  Problem Relation Age of Onset  . Hypertension Mother   . Cancer Mother   . Heart disease Father    History  Substance Use Topics  . Smoking status: Former Smoker    Quit date: 12/22/2008  . Smokeless tobacco: Never Used  . Alcohol Use: No     Comment: distant history of cocaine abuse, not active   OB History    Gravida Para Term  Preterm AB TAB SAB Ectopic Multiple Living   4 1 1  3  2   1      Review of Systems  Constitutional: Negative for fever and chills.  Gastrointestinal:       No bowel incontinence  Genitourinary:       No urinary incontinence  Musculoskeletal: Positive for myalgias, back pain and arthralgias.  Neurological:       No saddle anesthesia  All other systems reviewed and are negative.     Allergies  Sulfa antibiotics  Home Medications   Prior to Admission medications   Medication Sig Start Date End Date Taking? Authorizing Provider  furosemide (LASIX) 20 MG tablet Take 20 mg by mouth 2 (two) times daily. 09/28/14  Yes Historical Provider, MD  hydrochlorothiazide (HYDRODIURIL) 25 MG tablet Take 25 mg by mouth daily.  09/07/14  Yes Historical Provider, MD  potassium chloride (K-DUR,KLOR-CON) 10 MEQ tablet Take 20 mEq by mouth daily.   Yes Historical Provider, MD  traMADol (ULTRAM) 50 MG tablet Take 50 mg by mouth every 12 (twelve) hours as needed for moderate pain.  11/13/14  Yes Historical Provider, MD  azithromycin (ZITHROMAX) 250 MG tablet Take 4 tablets (1,000 mg total) by mouth once. Patient not taking: Reported on 12/05/2014 08/09/14   Lahoma Crocker, MD  senna (SENOKOT) 8.6 MG TABS tablet Take  2 tablets (17.2 mg total) by mouth daily. Patient not taking: Reported on 12/05/2014 07/27/14   Modena Jansky, MD  valACYclovir (VALTREX) 500 MG tablet Take 1 tablet (500 mg total) by mouth 2 (two) times daily. For 3 days Patient not taking: Reported on 12/05/2014 08/09/14   Lahoma Crocker, MD   BP 120/60 mmHg  Pulse 65  Temp(Src) 97.1 F (36.2 C) (Oral)  Resp 24  Ht 5\' 5"  (1.651 m)  Wt 210 lb 2 oz (95.312 kg)  BMI 34.97 kg/m2  SpO2 99%  LMP 11/03/2012 Physical Exam  Constitutional: She is oriented to person, place, and time. She appears well-developed and well-nourished. No distress.  HENT:  Head: Normocephalic and atraumatic.  Oropharynx is clear, no tonsillar exudate, no  abscess, no palpable masses  Eyes: Conjunctivae and EOM are normal. Right eye exhibits no discharge. Left eye exhibits no discharge. No scleral icterus.  Neck: Normal range of motion. Neck supple. No tracheal deviation present.  Cardiovascular: Normal rate, regular rhythm and normal heart sounds.  Exam reveals no gallop and no friction rub.   No murmur heard. Pulmonary/Chest: Effort normal and breath sounds normal. No respiratory distress. She has no wheezes.  Abdominal: Soft. She exhibits no distension. There is no tenderness.  Musculoskeletal: Normal range of motion.  Left-sided cervical paraspinal muscles tender to palpation, no bony tenderness, step-offs, or gross abnormality or deformity of spine, patient is able to ambulate, moves all extremities  Bilateral great toe extension intact Bilateral plantar/dorsiflexion intact  Neurological: She is alert and oriented to person, place, and time. She has normal reflexes.  Sensation and strength intact bilaterally Symmetrical reflexes  Skin: Skin is warm. She is not diaphoretic.  Psychiatric: She has a normal mood and affect. Her behavior is normal. Judgment and thought content normal.  Nursing note and vitals reviewed.   ED Course  Procedures (including critical care time) Labs Review Labs Reviewed  I-STAT CHEM 8, ED - Abnormal; Notable for the following:    Potassium 3.4 (*)    Glucose, Bld 100 (*)    All other components within normal limits    Imaging Review No results found.   EKG Interpretation None      MDM   Final diagnoses:  Fall    Patient with mechanical fall proximal one week ago, still complaining of some neck tenderness and pain. Will get imaging based on patient's surgical history. Suspect this is cervical strain however. Will treat patient's pain and will reassess.  Patient discussed with Dr. Roderic Palau.  Will proceed with CT angio neck because of concerning CT finding.  Otherwise, patient can probably be  discharged.  Patient signed out to [place], [place].  Plan:  Follow-up on CT angio.  If no indication for admission, DC to home with appropriate follow-up.    Montine Circle, PA-C 12/05/14 [place], MD 12/06/14 [phone]

## 2014-12-05 NOTE — ED Notes (Signed)
IV team at bedside 

## 2014-12-05 NOTE — Discharge Instructions (Signed)
Cervical Strain and Sprain (Whiplash) with Rehab Cervical strain and sprain are injuries that commonly occur with "whiplash" injuries. Whiplash occurs when the neck is forcefully whipped backward or forward, such as during a motor vehicle accident or during contact sports. The muscles, ligaments, tendons, discs, and nerves of the neck are susceptible to injury when this occurs. RISK FACTORS Risk of having a whiplash injury increases if:  Osteoarthritis of the spine.  Situations that make head or neck accidents or trauma more likely.  High-risk sports (football, rugby, wrestling, hockey, auto racing, gymnastics, diving, contact karate, or boxing).  Poor strength and flexibility of the neck.  Previous neck injury.  Poor tackling technique.  Improperly fitted or padded equipment. SYMPTOMS   Pain or stiffness in the front or back of neck or both.  Symptoms may present immediately or up to 24 hours after injury.  Dizziness, headache, nausea, and vomiting.  Muscle spasm with soreness and stiffness in the neck.  Tenderness and swelling at the injury site. PREVENTION  Learn and use proper technique (avoid tackling with the head, spearing, and head-butting; use proper falling techniques to avoid landing on the head).  Warm up and stretch properly before activity.  Maintain physical fitness:  Strength, flexibility, and endurance.  Cardiovascular fitness.  Wear properly fitted and padded protective equipment, such as padded soft collars, for participation in contact sports. PROGNOSIS  Recovery from cervical strain and sprain injuries is dependent on the extent of the injury. These injuries are usually curable in 1 week to 3 months with appropriate treatment.  RELATED COMPLICATIONS   Temporary numbness and weakness may occur if the nerve roots are damaged, and this may persist until the nerve has completely healed.  Chronic pain due to frequent recurrence of  symptoms.  Prolonged healing, especially if activity is resumed too soon (before complete recovery). TREATMENT  Treatment initially involves the use of ice and medication to help reduce pain and inflammation. It is also important to perform strengthening and stretching exercises and modify activities that worsen symptoms so the injury does not get worse. These exercises may be performed at home or with a therapist. For patients who experience severe symptoms, a soft, padded collar may be recommended to be worn around the neck.  Improving your posture may help reduce symptoms. Posture improvement includes pulling your chin and abdomen in while sitting or standing. If you are sitting, sit in a firm chair with your buttocks against the back of the chair. While sleeping, try replacing your pillow with a small towel rolled to 2 inches in diameter, or use a cervical pillow or soft cervical collar. Poor sleeping positions delay healing.  For patients with nerve root damage, which causes numbness or weakness, the use of a cervical traction apparatus may be recommended. Surgery is rarely necessary for these injuries. However, cervical strain and sprains that are present at birth (congenital) may require surgery. MEDICATION   If pain medication is necessary, nonsteroidal anti-inflammatory medications, such as aspirin and ibuprofen, or other minor pain relievers, such as acetaminophen, are often recommended.  Do not take pain medication for 7 days before surgery.  Prescription pain relievers may be given if deemed necessary by your caregiver. Use only as directed and only as much as you need. HEAT AND COLD:   Cold treatment (icing) relieves pain and reduces inflammation. Cold treatment should be applied for 10 to 15 minutes every 2 to 3 hours for inflammation and pain and immediately after any activity that aggravates   your symptoms. Use ice packs or an ice massage.  Heat treatment may be used prior to  performing the stretching and strengthening activities prescribed by your caregiver, physical therapist, or athletic trainer. Use a heat pack or a warm soak. SEEK MEDICAL CARE IF:   Symptoms get worse or do not improve in 2 weeks despite treatment.  New, unexplained symptoms develop (drugs used in treatment may produce side effects). EXERCISES RANGE OF MOTION (ROM) AND STRETCHING EXERCISES - Cervical Strain and Sprain These exercises may help you when beginning to rehabilitate your injury. In order to successfully resolve your symptoms, you must improve your posture. These exercises are designed to help reduce the forward-head and rounded-shoulder posture which contributes to this condition. Your symptoms may resolve with or without further involvement from your physician, physical therapist or athletic trainer. While completing these exercises, remember:   Restoring tissue flexibility helps normal motion to return to the joints. This allows healthier, less painful movement and activity.  An effective stretch should be held for at least 20 seconds, although you may need to begin with shorter hold times for comfort.  A stretch should never be painful. You should only feel a gentle lengthening or release in the stretched tissue. STRETCH- Axial Extensors  Lie on your back on the floor. You may bend your knees for comfort. Place a rolled-up hand towel or dish towel, about 2 inches in diameter, under the part of your head that makes contact with the floor.  Gently tuck your chin, as if trying to make a "double chin," until you feel a gentle stretch at the base of your head.  Hold __________ seconds. Repeat __________ times. Complete this exercise __________ times per day.  STRETCH - Axial Extension   Stand or sit on a firm surface. Assume a good posture: chest up, shoulders drawn back, abdominal muscles slightly tense, knees unlocked (if standing) and feet hip width apart.  Slowly retract your  chin so your head slides back and your chin slightly lowers. Continue to look straight ahead.  You should feel a gentle stretch in the back of your head. Be certain not to feel an aggressive stretch since this can cause headaches later.  Hold for __________ seconds. Repeat __________ times. Complete this exercise __________ times per day. STRETCH - Cervical Side Bend   Stand or sit on a firm surface. Assume a good posture: chest up, shoulders drawn back, abdominal muscles slightly tense, knees unlocked (if standing) and feet hip width apart.  Without letting your nose or shoulders move, slowly tip your right / left ear to your shoulder until your feel a gentle stretch in the muscles on the opposite side of your neck.  Hold __________ seconds. Repeat __________ times. Complete this exercise __________ times per day. STRETCH - Cervical Rotators   Stand or sit on a firm surface. Assume a good posture: chest up, shoulders drawn back, abdominal muscles slightly tense, knees unlocked (if standing) and feet hip width apart.  Keeping your eyes level with the ground, slowly turn your head until you feel a gentle stretch along the back and opposite side of your neck.  Hold __________ seconds. Repeat __________ times. Complete this exercise __________ times per day. RANGE OF MOTION - Neck Circles   Stand or sit on a firm surface. Assume a good posture: chest up, shoulders drawn back, abdominal muscles slightly tense, knees unlocked (if standing) and feet hip width apart.  Gently roll your head down and around from the   back of one shoulder to the back of the other. The motion should never be forced or painful.  Repeat the motion 10-20 times, or until you feel the neck muscles relax and loosen. Repeat __________ times. Complete the exercise __________ times per day. STRENGTHENING EXERCISES - Cervical Strain and Sprain These exercises may help you when beginning to rehabilitate your injury. They may  resolve your symptoms with or without further involvement from your physician, physical therapist, or athletic trainer. While completing these exercises, remember:   Muscles can gain both the endurance and the strength needed for everyday activities through controlled exercises.  Complete these exercises as instructed by your physician, physical therapist, or athletic trainer. Progress the resistance and repetitions only as guided.  You may experience muscle soreness or fatigue, but the pain or discomfort you are trying to eliminate should never worsen during these exercises. If this pain does worsen, stop and make certain you are following the directions exactly. If the pain is still present after adjustments, discontinue the exercise until you can discuss the trouble with your clinician. STRENGTH - Cervical Flexors, Isometric  Face a wall, standing about 6 inches away. Place a small pillow, a ball about 6-8 inches in diameter, or a folded towel between your forehead and the wall.  Slightly tuck your chin and gently push your forehead into the soft object. Push only with mild to moderate intensity, building up tension gradually. Keep your jaw and forehead relaxed.  Hold 10 to 20 seconds. Keep your breathing relaxed.  Release the tension slowly. Relax your neck muscles completely before you start the next repetition. Repeat __________ times. Complete this exercise __________ times per day. STRENGTH- Cervical Lateral Flexors, Isometric   Stand about 6 inches away from a wall. Place a small pillow, a ball about 6-8 inches in diameter, or a folded towel between the side of your head and the wall.  Slightly tuck your chin and gently tilt your head into the soft object. Push only with mild to moderate intensity, building up tension gradually. Keep your jaw and forehead relaxed.  Hold 10 to 20 seconds. Keep your breathing relaxed.  Release the tension slowly. Relax your neck muscles completely  before you start the next repetition. Repeat __________ times. Complete this exercise __________ times per day. STRENGTH - Cervical Extensors, Isometric   Stand about 6 inches away from a wall. Place a small pillow, a ball about 6-8 inches in diameter, or a folded towel between the back of your head and the wall.  Slightly tuck your chin and gently tilt your head back into the soft object. Push only with mild to moderate intensity, building up tension gradually. Keep your jaw and forehead relaxed.  Hold 10 to 20 seconds. Keep your breathing relaxed.  Release the tension slowly. Relax your neck muscles completely before you start the next repetition. Repeat __________ times. Complete this exercise __________ times per day. POSTURE AND BODY MECHANICS CONSIDERATIONS - Cervical Strain and Sprain Keeping correct posture when sitting, standing or completing your activities will reduce the stress put on different body tissues, allowing injured tissues a chance to heal and limiting painful experiences. The following are general guidelines for improved posture. Your physician or physical therapist will provide you with any instructions specific to your needs. While reading these guidelines, remember:  The exercises prescribed by your provider will help you have the flexibility and strength to maintain correct postures.  The correct posture provides the optimal environment for your joints to   work. All of your joints have less wear and tear when properly supported by a spine with good posture. This means you will experience a healthier, less painful body.  Correct posture must be practiced with all of your activities, especially prolonged sitting and standing. Correct posture is as important when doing repetitive low-stress activities (typing) as it is when doing a single heavy-load activity (lifting). PROLONGED STANDING WHILE SLIGHTLY LEANING FORWARD When completing a task that requires you to lean  forward while standing in one place for a long time, place either foot up on a stationary 2- to 4-inch high object to help maintain the best posture. When both feet are on the ground, the low back tends to lose its slight inward curve. If this curve flattens (or becomes too large), then the back and your other joints will experience too much stress, fatigue more quickly, and can cause pain.  RESTING POSITIONS Consider which positions are most painful for you when choosing a resting position. If you have pain with flexion-based activities (sitting, bending, stooping, squatting), choose a position that allows you to rest in a less flexed posture. You would want to avoid curling into a fetal position on your side. If your pain worsens with extension-based activities (prolonged standing, working overhead), avoid resting in an extended position such as sleeping on your stomach. Most people will find more comfort when they rest with their spine in a more neutral position, neither too rounded nor too arched. Lying on a non-sagging bed on your side with a pillow between your knees, or on your back with a pillow under your knees will often provide some relief. Keep in mind, being in any one position for a prolonged period of time, no matter how correct your posture, can still lead to stiffness. WALKING Walk with an upright posture. Your ears, shoulders, and hips should all line up. OFFICE WORK When working at a desk, create an environment that supports good, upright posture. Without extra support, muscles fatigue and lead to excessive strain on joints and other tissues. CHAIR:  A chair should be able to slide under your desk when your back makes contact with the back of the chair. This allows you to work closely.  The chair's height should allow your eyes to be level with the upper part of your monitor and your hands to be slightly lower than your elbows.  Body position:  Your feet should make contact with the  floor. If this is not possible, use a foot rest.  Keep your ears over your shoulders. This will reduce stress on your neck and low back. Document Released: 12/08/2005 Document Revised: 04/24/2014 Document Reviewed: 03/22/2009 Exit[employer] Patient Information 2015 [employer]. This information is not intended to replace advice given to you by your health care provider. Make sure you discuss any questions you have with your health care provider.  

## 2014-12-05 NOTE — ED Notes (Signed)
Pt states fell back onto chair; jumped up really fast. Neck pain been going on for month but worsening after this. States worse when turns head to left.

## 2014-12-05 NOTE — ED Notes (Signed)
Patient also has itching.  States her md has given her vistaril but no relief

## 2014-12-05 NOTE — ED Provider Notes (Signed)
4:00 PM Handoff from [facility] at shift change. CT angiogram pending to evaluate neck mass.  Plan: Follow-up on CT results and have patient treat neck pain symptomatically. Prescription for Valium given.   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 I spoke with Dr. Trula Slade and have arranged for follow-up in office within the next week.  Patient informed of results. Will continue with previous plan. Patient states that her neck pain is improved. She has no cranial nerve deficits on exam. Her pain is on the left side of her neck, not her right. She understands follow-up plan. She is to return with worsening symptoms or other concerns.  Tasha Cater, PA-C 12/05/14 [place], DO 12/08/14 1356

## 2014-12-05 NOTE — ED Notes (Signed)
Patient reports she has had sharp pains that radiates from the base of her neck and moves up into her head.  Patient also reports she fell last Thursday and hit her head.  She reports she has soreness in the back of her head.  She missed the chair.  No loc.  Patient with nausea today.  Patient is not on blood thinners.  Patient is alert and oriented

## 2014-12-07 ENCOUNTER — Telehealth: Payer: Self-pay | Admitting: Surgery

## 2014-12-07 NOTE — Telephone Encounter (Addendum)
-----   Message from Serafina Mitchell, MD sent at 12/07/2014  1:17 PM EST ----- Pt needs to see me for new patient eval of carotid body tumor  notified patient of appt. on 12-08-14 at 2:15, patient to call back to confirm

## 2014-12-08 ENCOUNTER — Encounter: Payer: Self-pay | Admitting: Surgery

## 2014-12-11 ENCOUNTER — Encounter: Payer: BC Managed Care – PPO | Admitting: Surgery

## 2014-12-18 ENCOUNTER — Encounter: Payer: Self-pay | Admitting: *Deleted

## 2014-12-19 ENCOUNTER — Encounter: Payer: Self-pay | Admitting: Obstetrics & Gynecology

## 2015-01-05 ENCOUNTER — Encounter: Payer: Self-pay | Admitting: Surgery

## 2015-01-08 ENCOUNTER — Ambulatory Visit (INDEPENDENT_AMBULATORY_CARE_PROVIDER_SITE_OTHER): Payer: Self-pay | Admitting: Surgery

## 2015-01-08 ENCOUNTER — Encounter: Payer: Self-pay | Admitting: Surgery

## 2015-01-08 VITALS — BP 138/108 | HR 58 | Ht 65.0 in | Wt 215.0 lb

## 2015-01-08 DIAGNOSIS — D446 Neoplasm of uncertain behavior of carotid body: Secondary | ICD-10-CM
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
## 2015-01-08 NOTE — Addendum Note (Signed)
Addended by: Mena Goes on: 01/08/2015 11:43 AM   Modules accepted: Orders

## 2015-01-18 ENCOUNTER — Other Ambulatory Visit: Payer: Self-pay | Admitting: *Deleted

## 2015-01-18 DIAGNOSIS — D446 Neoplasm of uncertain behavior of carotid body: Secondary | ICD-10-CM

## 2015-01-26 ENCOUNTER — Telehealth: Payer: Self-pay | Admitting: Surgery

## 2015-01-26 NOTE — Telephone Encounter (Signed)
-----   Message from Sherrye Payor, RN sent at 01/25/2015  1:08 PM EST ----- Regarding: FW: Regarding a Referral and request to call patient Contact: [phone] Please refer to an ENT @ [facility]; pt. Is available to take calls after 4:30 PM at above number. ----- Message -----    From: Serafina Mitchell, MD    Sent: 01/25/2015  10:23 AM      To: Lynetta Mare Pullins, RN Subject: RE: Regarding a Referral and request to call#  Just send her to a ENT at [facility].  I don't know any. I have already showed it to ENT in [place] ----- Message -----    From: Sherrye Payor, RN    Sent: 01/24/2015   5:14 PM      To: Serafina Mitchell, MD Subject: Regarding a Referral and request to call pat#  You referred this pt. to [facility] for tx of Carotid Body Tumor; She was advised by the MD (Dr. Oletta Lamas) @ [facility] that she should be referred to an ENT specialist in [facility].  Do you know who you want me to make the referral to? Also she is very anxious, and requested that you call her.  She doesn't get home until 4:30 PM, so she isn't available at all during her work hours.  She would like you to call her after 4:30 PM @ above number

## 2015-01-26 NOTE — Telephone Encounter (Signed)
I have scheduled Ms Tasha [name] at [facility] with Dr Vicie Mutters in the ENT department for 01/31/15 @ 10:15am. I have left Ms Tasha [name] a message on her home #. I see from the previous notes that she is difficult to get on the phone due to her work schedule. I will call again today.

## 2015-04-10 ENCOUNTER — Telehealth: Payer: Self-pay | Admitting: *Deleted

## 2015-04-10 NOTE — Telephone Encounter (Signed)
Placed introductory call to new referral patient. 1. Introduced myself as the oncology nurse navigator that works with Dr. Isidore Moos to whom she has been referred by Dr. Macky Lower and with whom she has an appt next Wed. 2. I briefly explained my role as her navigator, I indicated that I would be joining her during her appt next week. 3. I confirmed her understanding of the [facility] location, explained the arrival and RadOnc registration process for her appt. 4. I provided her my contact information, encouraged her to call if she has questions/concerns before next week. 5. She verbalized understanding of information provided, expressed appreciation for my call.  [name], RN, BSN, [place] [facility] [phone]

## 2015-04-17 NOTE — Progress Notes (Signed)
Head and Neck Cancer Location of Tumor / Histology: Carotid  Body tumor  Patient presented  months ago with symptoms of: none  ,patient had fallen and hit her head, at work [place], waited 2 days head hurting thn went to ED Biopsies of  (if applicable) revealed: none done  Nutrition Status Yes No Comments  Weight changes? [x]  []  8 lb loss since 04/09/15  Swallowing concerns? []  [x]    PEG? []  [x]     Referrals Yes No Comments  Social Work? []  [x]    Dentistry? []  [x]    Swallowing therapy? []  [x]    Nutrition? []  [x]  Appetite  100%  Med/Onc? []  [x]     Safety Issues Yes No Comments  Prior radiation? []  [x]    Pacemaker/ICD? []  [x]    Possible current pregnancy? []  [x]    Is the patient on methotrexate? []  [x]     Tobacco/Marijuana/Snuff/ETOH use: 1 ppd cigarettes for 30 years,quit 08/22/2009,never used smokeless tobacco never used, no alcohol use,(distant hx of cocaine abuse) no drug use  Past/Anticipated interventions by otolaryngology, if any: Dr. Ardath Sax, MD  Past/Anticipated interventions by medical oncology, if any: Dr. 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

## 2015-04-18 ENCOUNTER — Ambulatory Visit
Admission: RE | Admit: 2015-04-18 | Discharge: 2015-04-18 | Disposition: A | Discharge status: Home / Self Care | Payer: 59 | Source: Ambulatory Visit | Attending: Radiation Oncology | Admitting: Radiation Oncology

## 2015-04-18 ENCOUNTER — Encounter: Payer: Self-pay | Admitting: Radiation Oncology

## 2015-04-18 ENCOUNTER — Encounter: Payer: Self-pay | Admitting: *Deleted

## 2015-04-18 DIAGNOSIS — C754 Malignant neoplasm of carotid body: Secondary | ICD-10-CM

## 2015-04-18 DIAGNOSIS — D447 Neoplasm of uncertain behavior of aortic body and other paraganglia: Secondary | ICD-10-CM | POA: Insufficient documentation

## 2015-04-18 DIAGNOSIS — D446 Neoplasm of uncertain behavior of carotid body: Secondary | ICD-10-CM | POA: Insufficient documentation

## 2015-04-18 NOTE — Progress Notes (Signed)
Radiation Oncology         [phone] ________________________________  Initial outpatient Consultation  Name: Tasha [name] MRN: [id number]  Date: 04/18/2015  DOB: [birth year]  CC:No primary care provider on file.  Philomena Doheny, MD   REFERRING PHYSICIAN: Philomena Doheny, MD  DIAGNOSIS: Right carotid body paraganglioma   ICD-9-CM ICD-10-CM   1. Paraganglioma, carotid body 237.3 D44.6     HISTORY OF PRESENT ILLNESS::Tasha [name] is a 59 y.o. female who presented with a fall while stooping down to sit in a lawn chair. She hit her head and had left neck pain and went to the ED. Imaging revealed a right carotid body tumor. She was referred to Dr. Macky Lower. He discussed embolizing and resection versus radiation. 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 His was plan was to review with radiology and refer the pt to radiotherapy for consideration of treatment. There was a left ICA occlusion, which would make surgery quite risky.   The pt has lost 8 lbs since  04/09/15 and states she has a good appetite. The pt has a hx of blacking out and falling. The pt has occasional throbbing headaches. Notes swelling in right neck.  PREVIOUS RADIATION THERAPY: No  PAST MEDICAL HISTORY:  has a past medical history of Hypertension; Fibroids; DDD (degenerative disc disease); Pyelonephritis (2004); and Vertigo (2014).    Past history of ependymoma of the filum terminale  PAST SURGICAL HISTORY: Past Surgical History  Procedure Laterality Date  . Neck surgery      cadaver bones put in her neck  . Cholecystectomy  2001  . Cesarean section    . Ercp w/ sphicterotomy  2001    removal CBD stones  . 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     FAMILY HISTORY: family history includes Cancer in her father and mother; Heart attack in her father; Heart disease in her father; Hyperlipidemia in her mother;  Hypertension in her mother.  SOCIAL HISTORY:  reports that she quit smoking about 6 years ago. She has never used smokeless tobacco. She reports that she uses illicit drugs (Marijuana) about twice per week. She reports that she does not drink alcohol.  1 pack per day cigarettes for 30 years, quit in 08/22/2009. Never used smokeless tobacco. No alcohol use. No drug use (distant hx of cocaine abuse over 20 years ago).  ALLERGIES: Antihistamines, chlorpheniramine-type and Sulfa antibiotics  MEDICATIONS:  Current Outpatient Prescriptions  Medication Sig Dispense Refill  . diazepam (VALIUM) 5 MG tablet Take 1 tablet (5 mg total) by mouth every 8 (eight) hours as needed for anxiety. 5 tablet 0  . ergocalciferol (VITAMIN D2) 50000 UNITS capsule Take 50,000 capsules by mouth as needed.    . hydrochlorothiazide (HYDRODIURIL) 25 MG tablet Take 25 mg by mouth daily.   0  . traMADol (ULTRAM) 50 MG tablet Take 50 mg by mouth every 12 (twelve) hours as needed for moderate pain.   2  . furosemide (LASIX) 20 MG tablet Take 20 mg by mouth as needed.   1  . HYDROcodone-acetaminophen (NORCO/VICODIN) 5-325 MG per tablet Take 1-2 tablets by mouth every 6 (six) hours as needed. (Patient not taking: Reported on 01/08/2015) 10 tablet 0  . potassium chloride (K-DUR,KLOR-CON) 10 MEQ tablet Take 20 mEq by mouth daily.    Marland Kitchen senna (SENOKOT) 8.6 MG TABS tablet Take 2 tablets (17.2 mg total) by  mouth daily. (Patient not taking: Reported on 04/18/2015) 60 each 0   No current facility-administered medications for this encounter.    REVIEW OF SYSTEMS:  Notable for that above.   PHYSICAL EXAM:  height is 5\' 5"  (1.651 m) and weight is 207 lb 8 oz (94.121 kg). Her oral temperature is 97.7 F (36.5 C). Her blood pressure is 129/68 and her pulse is 66. Her respiration is 20 and oxygen saturation is 100%.   General: Alert and oriented, in no acute distress HEENT: Head is normocephalic. Extraocular movements are intact. Oropharynx is  clear. Neck: Right neck has fullness in the region anterior to sternocleidomastoid. No carotid bruits. Heart: Regular in rate and rhythm with no murmurs, rubs, or gallops. Chest: Clear to auscultation bilaterally, with no rhonchi, wheezes, or rales. Abdomen: Soft, nontender, nondistended, with no rigidity or guarding. Extremities: No cyanosis or edema. Lymphatics: see Neck Exam Skin: No concerning lesions. Musculoskeletal: symmetric strength and muscle tone throughout. Neurologic: Cranial nerves II through XII are grossly intact. No obvious focalities. Speech is fluent. Coordination is intact. Psychiatric: Judgment and insight are intact. Affect is appropriate.    ECOG = 0  0 - Asymptomatic (Fully active, able to carry on all predisease activities without restriction)  1 - Symptomatic but completely ambulatory (Restricted in physically strenuous activity but ambulatory and able to carry out work of a light or sedentary nature. For example, light housework, office work)  2 - Symptomatic, <50% in bed during the day (Ambulatory and capable of all self care but unable to carry out any work activities. Up and about more than 50% of waking hours)  3 - Symptomatic, >50% in bed, but not bedbound (Capable of only limited self-care, confined to bed or chair 50% or more of waking hours)  4 - Bedbound (Completely disabled. Cannot carry on any self-care. Totally confined to bed or chair)  5 - Death   Eustace Pen MM, Creech RH, Tormey DC, et al. [phone]). "Toxicity and response criteria of the [facility]". Marlette Oncol. 5 (6): 649-55   LABORATORY DATA:  Lab Results  Component Value Date   WBC 7.3 07/26/2014   HGB 12.9 12/05/2014   HCT 38.0 12/05/2014   MCV 87.7 07/26/2014   PLT 269 07/26/2014   CMP     Component Value Date/Time   NA 139 12/05/2014 1237   K 3.4* 12/05/2014 1237   CL 105 12/05/2014 1237   CO2 20 07/27/2014 1214   GLUCOSE 100* 12/05/2014 1237   BUN  13 12/05/2014 1237   CREATININE 0.70 12/05/2014 1237   CALCIUM 8.2* 07/27/2014 1214   PROT 7.7 07/26/2014 1727   ALBUMIN 3.7 07/26/2014 1727   AST 16 07/26/2014 1727   ALT 22 07/26/2014 1727   ALKPHOS 74 07/26/2014 1727   BILITOT 0.6 07/26/2014 1727   GFRNONAA >90 07/27/2014 1214   GFRAA >90 07/27/2014 1214         RADIOGRAPHY: No results found.    IMPRESSION/PLAN: Today, I talked to the patient about the findings and work-up thus far. We discussed the patient's diagnosis of paraganglioma and general treatment for this, highlighting the role of radiotherapy in the management. We discussed the available radiation techniques, and focused on the details of logistics and delivery.    We discussed the risks, benefits, and side effects of radiotherapy. Side effects may include but not necessarily be limited to: esophagitis, skin irritation, fatigue, injury to neck tissues including vessels. No guarantees of treatment were  given. A consent form was signed and placed in the patient's medical record. The patient was encouraged to ask questions that I answered to the best of my ability.   Schedule a CT Sim the week after Mother's Day in order to comply with the pt's work schedule. IMRT will be used to spare cord, esophagus, parotid tissue.     This document serves as a record of services personally performed by Eppie Gibson, MD. It was created on her behalf by Darcus Austin, a trained medical scribe. The creation of this record is based on the scribe's personal observations and the provider's statements to them. This document has been checked and approved by the attending provider.     __________________________________________   Eppie Gibson, MD

## 2015-04-18 NOTE — Progress Notes (Signed)
Please see the Nurse Progress Note in the MD Initial Consult Encounter for this patient. 

## 2015-04-20 NOTE — Progress Notes (Signed)
Met with patient during initial consult with Dr. Isidore Moos.   1. Further introduced myself as her Navigator, explained my role as a member of the Care Team, provided contact information, encouraged her to contact me with questions/concerns as treatments/procedures begin. 2. Provided New Patient Information packet:  Contact information for physician and navigator  Advance Directive information ([place] blue pamphlet)  Fall Prevention Patient Safety Plan  Appointment Guideline  [facility] campus map with highlight of [place] 3. Provided introductory explanation of radiation treatment including SIM planning and fitting of head mask, showed them example of mask.   4. Provided a tour of SIM and Tomo areas, explained treatment and arrival procedures.   She verbalized understanding of information provided.    [name], RN, BSN, [place] at [place] [phone]

## 2015-04-30 ENCOUNTER — Telehealth: Payer: Self-pay | Admitting: *Deleted

## 2015-04-30 ENCOUNTER — Encounter (HOSPITAL_COMMUNITY): Payer: Self-pay | Admitting: Cardiology

## 2015-04-30 ENCOUNTER — Emergency Department (HOSPITAL_COMMUNITY): Payer: 59

## 2015-04-30 ENCOUNTER — Emergency Department (HOSPITAL_COMMUNITY)
Admission: EM | Admit: 2015-04-30 | Discharge: 2015-05-01 | Discharge status: Against Medical Advice | Payer: 59 | Attending: Emergency Medicine | Admitting: Emergency Medicine

## 2015-04-30 DIAGNOSIS — Z87448 Personal history of other diseases of urinary system: Secondary | ICD-10-CM | POA: Diagnosis not present

## 2015-04-30 DIAGNOSIS — Z8739 Personal history of other diseases of the musculoskeletal system and connective tissue: Secondary | ICD-10-CM | POA: Diagnosis not present

## 2015-04-30 DIAGNOSIS — Z86018 Personal history of other benign neoplasm: Secondary | ICD-10-CM | POA: Diagnosis not present

## 2015-04-30 DIAGNOSIS — Z87891 Personal history of nicotine dependence: Secondary | ICD-10-CM | POA: Diagnosis not present

## 2015-04-30 DIAGNOSIS — I10 Essential (primary) hypertension: Secondary | ICD-10-CM | POA: Diagnosis not present

## 2015-04-30 DIAGNOSIS — R519 Headache, unspecified: Secondary | ICD-10-CM

## 2015-04-30 DIAGNOSIS — R51 Headache: Secondary | ICD-10-CM | POA: Diagnosis present

## 2015-04-30 LAB — BASIC METABOLIC PANEL WITH GFR
Anion gap: 14 (ref 5–15)
BUN: 17 mg/dL (ref 6–20)
CO2: 20 mmol/L — ABNORMAL LOW (ref 22–32)
Calcium: 9.2 mg/dL (ref 8.9–10.3)
Chloride: 109 mmol/L (ref 101–111)
Creatinine, Ser: 0.73 mg/dL (ref 0.44–1.00)
GFR calc Af Amer: >60 mL/min
GFR calc non Af Amer: >60 mL/min
Glucose, Bld: 99 mg/dL (ref 70–99)
Potassium: 3.6 mmol/L (ref 3.5–5.1)
Sodium: 143 mmol/L (ref 135–145)

## 2015-04-30 MED ORDER — SODIUM CHLORIDE 0.9 % IV BOLUS (SEPSIS): 1000.0000 mL | Freq: Once | INTRAVENOUS | Status: DC

## 2015-04-30 NOTE — ED Notes (Signed)
Pt reports that she has been having headaches for the past couple of weeks. Reports she has a "cyst between arteries in her brain". Reports some blurred vision.

## 2015-04-30 NOTE — ED Notes (Signed)
Pt states that she is ready to go home. This RN explained that her blood work will be checked.

## 2015-04-30 NOTE — ED Notes (Signed)
Pt refusing any more IV attempts at this time.

## 2015-04-30 NOTE — Telephone Encounter (Signed)
Patient called to report onset of intermittent "excrutiating" headaches beginning last Tuesday.  Pain is in temple and back of head, rated 10 of 10, eventually "passes".  She took Ibuprofen this morning at 6:30 but obtained no relief.  She has not called her PCP.  She indicated she will go to ER this afternoon unless alternate guidance provided.  I indicated I would inform Dr. Isidore Moos, call her back.  [name], RN, BSN, [place] at [place] [phone]

## 2015-04-30 NOTE — ED Notes (Signed)
Pt updated on current wait time

## 2015-04-30 NOTE — Telephone Encounter (Signed)
Called patient, advised her to go to ER per Dr. Isidore Moos.  She indicated she would do so when her husband gets off work today.  [name], RN, BSN, [place] at [place] [phone]

## 2015-05-01 LAB — CBC WITH DIFFERENTIAL/PLATELET
Band Neutrophils: 0 % (ref 0–10)
Basophils Absolute: 0 10*3/uL (ref 0.0–0.1)
Basophils Relative: 0 % (ref 0–1)
Blasts: 0 %
Eosinophils Absolute: 0.1 10*3/uL (ref 0.0–0.7)
Eosinophils Relative: 2 % (ref 0–5)
HCT: 36.5 % (ref 36.0–46.0)
Hemoglobin: 12.2 g/dL (ref 12.0–15.0)
Lymphocytes Relative: 52 % — ABNORMAL HIGH (ref 12–46)
Lymphs Abs: 3.3 10*3/uL (ref 0.7–4.0)
MCH: 30 pg (ref 26.0–34.0)
MCHC: 33.4 g/dL (ref 30.0–36.0)
MCV: 89.7 fL (ref 78.0–100.0)
Metamyelocytes Relative: 0 %
Monocytes Absolute: 0.1 10*3/uL (ref 0.1–1.0)
Monocytes Relative: 2 % — ABNORMAL LOW (ref 3–12)
Myelocytes: 0 %
Neutro Abs: 2.7 10*3/uL (ref 1.7–7.7)
Neutrophils Relative %: 44 % (ref 43–77)
Platelets: 190 10*3/uL (ref 150–400)
Promyelocytes Absolute: 0 %
RBC: 4.07 MIL/uL (ref 3.87–5.11)
RDW: 15 % (ref 11.5–15.5)
WBC: 6.2 10*3/uL (ref 4.0–10.5)
nRBC: 0 /100 WBC

## 2015-05-01 NOTE — ED Notes (Signed)
Pt again states that she wants to leave, that she has been here for 8 hours and that she has to work in the morning. This RN told the pt that she would send [place], [place] in as soon as possible.

## 2015-05-01 NOTE — ED Notes (Signed)
Pt is dressed and leaving the department. This RN asked the pt if she would sign the AMA form, and the pt refused.

## 2015-05-01 NOTE — ED Provider Notes (Signed)
CSN: [id number]     Arrival date & time 04/30/15  1522 History   First MD Initiated Contact with Patient 04/30/15 1946     Chief Complaint  Patient presents with  . Headache     (Consider location/radiation/quality/duration/timing/severity/associated sxs/prior Treatment) HPI Patient presents to the emergency department with headache that has been ongoing over the last week.  The patient states that she has had a cyst between blood vessels in her neck.  The patient states that she has gotten headaches from time to time.  Patient denies chest pain, shortness of breath, weakness, dizziness, blurred vision, back pain, fever, cough, runny nose, sore throat, difficulty swallowing, rash, or syncope.  The patient states that she does have some neck discomfort as well.  It is mainly left sided temporal headache that waxes and wanes. Past Medical History  Diagnosis Date  . Hypertension   . Fibroids   . DDD (degenerative disc disease)     DJD.  spine.  chronic pain.   Marland Kitchen Pyelonephritis 2004  . Vertigo 2014    positional.  Eval with Dr Jannifer Franklin, neuro, 2014.    Past Surgical History  Procedure Laterality Date  . Neck surgery      cadaver bones put in her neck  . Cholecystectomy  2001  . Cesarean section    . Ercp w/ sphicterotomy  2001    removal CBD stones  . Skin biopsy  12/2009, 05/5680    2751 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    Family History  Problem Relation Age of Onset  . Hypertension Mother   . Cancer Mother   . Hyperlipidemia Mother   . Heart disease Father   . Cancer Father   . Heart attack Father    History  Substance Use Topics  . Smoking status: Former Smoker    Quit date: 12/22/2008  . Smokeless tobacco: Never Used  . Alcohol Use: No     Comment: distant history of cocaine abuse, not active   OB History    Gravida Para Term Preterm AB TAB SAB Ectopic Multiple Living   4 1 1  3  2   1      Review of Systems  All other  systems negative except as documented in the HPI. All pertinent positives and negatives as reviewed in the HPI.  Allergies  Antihistamines, chlorpheniramine-type and Sulfa antibiotics  Home Medications   Prior to Admission medications   Medication Sig Start Date End Date Taking? Authorizing Provider  hydrochlorothiazide (HYDRODIURIL) 25 MG tablet Take 25 mg by mouth daily.  09/07/14  Yes Historical Provider, MD  diazepam (VALIUM) 5 MG tablet Take 1 tablet (5 mg total) by mouth every 8 (eight) hours as needed for anxiety. Patient not taking: Reported on 04/30/2015 12/05/14   Montine Circle, PA-C  HYDROcodone-acetaminophen (NORCO/VICODIN) 5-325 MG per tablet Take 1-2 tablets by mouth every 6 (six) hours as needed. Patient not taking: Reported on 01/08/2015 12/05/14   Montine Circle, PA-C  senna (SENOKOT) 8.6 MG TABS tablet Take 2 tablets (17.2 mg total) by mouth daily. Patient not taking: Reported on 04/18/2015 07/27/14   Modena Jansky, MD   BP 142/69 mmHg  Pulse 77  Temp(Src) 98.4 F (36.9 C) (Oral)  Resp 16  Ht 5\' 4"  (1.626 m)  Wt 214 lb 2 oz (97.126 kg)  BMI 36.74 kg/m2  SpO2 92%  LMP 11/03/2012 Physical Exam  Constitutional: She is oriented to person, place, and time. She appears well-developed and well-nourished.  No distress.  HENT:  Head: Normocephalic and atraumatic.  Mouth/Throat: Oropharynx is clear and moist.  Eyes: Conjunctivae and EOM are normal. Pupils are equal, round, and reactive to light.  Neck: Normal range of motion. Neck supple. No thyromegaly present.  Cardiovascular: Normal rate, regular rhythm and normal heart sounds.  Exam reveals no gallop and no friction rub.   No murmur heard. Pulmonary/Chest: Effort normal and breath sounds normal. No respiratory distress.  Musculoskeletal: She exhibits no edema.  Neurological: She is alert and oriented to person, place, and time. She exhibits normal muscle tone. Coordination normal.  Skin: Skin is warm and dry. No rash  noted. No erythema.  Psychiatric: She has a normal mood and affect. Her behavior is normal.  Nursing note and vitals reviewed.   ED Course  Procedures (including critical care time) Labs Review Labs Reviewed  BASIC METABOLIC PANEL - Abnormal; Notable for the following:    CO2 20 (*)    All other components within normal limits  CBC WITH DIFFERENTIAL/PLATELET  URINALYSIS, ROUTINE W REFLEX MICROSCOPIC    Imaging Review Ct Head Wo Contrast  04/30/2015   CLINICAL DATA:  Headaches, approximately 2 weeks. Some visual changes.  EXAM: CT HEAD WITHOUT CONTRAST  TECHNIQUE: Contiguous axial images were obtained from the base of the skull through the vertex without intravenous contrast.  COMPARISON:  12/05/2014  FINDINGS: There is no intracranial hemorrhage, mass or evidence of acute infarction. Gray matter and white matter appear normal. Brainstem and posterior fossa are unremarkable. The ventricles and basal cisterns appear normal.  The bony structures are intact. The visible portions of the paranasal sinuses are clear.  IMPRESSION: Normal brain   Electronically Signed   By: Andreas Newport M.D.   On: 04/30/2015 22:04   I advised the patient of the plan.   Nursing staff had an issue with the IV and then the patient refused any IV therapy.  There is also an issue with the blood draw.  The patient is advised that symptoms.  Her laboratory testing has returned.  We will give her the results and I will have her follow-up with neurology most likely since her head CT was normal  The patient left AMA before I was able to speak with her.  The patient had a lengthy lab delay.  I had the nurses clarify what the issue was and the labs finally were drawn    Dalia Heading, PA-C 05/03/15 [place], MD 05/09/15 [phone]

## 2015-05-04 ENCOUNTER — Encounter: Payer: Self-pay | Admitting: *Deleted

## 2015-05-04 ENCOUNTER — Ambulatory Visit
Admission: RE | Admit: 2015-05-04 | Discharge: 2015-05-04 | Disposition: A | Discharge status: Home / Self Care | Payer: 59 | Source: Ambulatory Visit | Attending: Radiation Oncology | Admitting: Radiation Oncology

## 2015-05-04 DIAGNOSIS — D446 Neoplasm of uncertain behavior of carotid body: Secondary | ICD-10-CM

## 2015-05-04 DIAGNOSIS — D447 Neoplasm of uncertain behavior of aortic body and other paraganglia: Secondary | ICD-10-CM | POA: Diagnosis not present

## 2015-05-04 MED ORDER — SODIUM CHLORIDE 0.9 % IJ SOLN
10.0000 mL | Freq: Once | INTRAMUSCULAR | Status: AC
  Administered 2015-05-04: 10 mL via INTRAVENOUS

## 2015-05-04 NOTE — Progress Notes (Signed)
Simulation, IMRT treatment planning  note   Outpatient  Diagnosis:    ICD-9-CM ICD-10-CM   1. 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 An Aquaplast head and shoulder mask was custom fitted to the patient's anatomy. High-resolution CT axial imaging was obtained of the head and neck with contrast. I verified that the quality of the imaging is good for treatment planning. 1 Medically Necessary Treatment Device was fabricated and supervised by me: Aquaplast mask.   Treatment planning note I plan to treat the patient with helical Tomotherapy, IMRT. I plan to treat the patient's right paraganglioma with tight margins. I plan to treat to a total dose of 45 Gray in 25  Fractions. Dose calculation was ordered from dosimetry.  IMRT planning Note  IMRT is an important modality to deliver adequate dose to the patient's at risk tissues while sparing the patient's normal structures, including the: esophagus, parotid tissue, mandible, brain stem, spinal cord, oral cavity, brachial plexus.  This justifies the use of IMRT in the patient's treatment.    -----------------------------------  Eppie Gibson, MD

## 2015-05-04 NOTE — Progress Notes (Signed)
Patient arrived, gave name and dob as identification, labs from 04/30/15: BUN=17, CR=0.73, not diabetic no IV dye allergy, Cira Rue, RN started IV catheter x 1 attempt right wrist area, execllent blood return, patient tolerated well no c/o pain 8:40 AM

## 2015-05-04 NOTE — Progress Notes (Signed)
To provide support and encouragement, care continuity and to assess for needs, met with patient during her CT SIM. 1. She tolerated procedure well, experienced some anxiety, "I was thinking about my mother".  She stated she has Valium if she needs it when tmts begin.  I encouraged her to have a driver if she takes medication, she indicated she will. 2. I showed her Tomo area, explained arrival/check-in/preparation procedures. 3. She verbalized understanding of information provided.  Rick Diehl, RN, BSN, CHPN Head & Neck Oncology Navigator [facility] at [place] [phone]   

## 2015-05-07 ENCOUNTER — Encounter: Payer: Self-pay | Admitting: Radiation Oncology

## 2015-05-07 DIAGNOSIS — D447 Neoplasm of uncertain behavior of aortic body and other paraganglia: Secondary | ICD-10-CM | POA: Diagnosis not present

## 2015-05-07 NOTE — Progress Notes (Signed)
IMRT simulation/treatment planning note: The patient completed IMRT planning with Tomotherapy today in the management of her right carotid body paraganglioma.  IMRT was chosen to decrease the risk for excessive dose to the pharyngeal constrictors.  Dose volume histograms were obtained for the target PTV and also avoidance structures and we met our departmental guidelines.  She is being treated with helical IMRT/6 MV photons.  She will receive 4500 cGy in 25 sessions.

## 2015-05-08 DIAGNOSIS — D447 Neoplasm of uncertain behavior of aortic body and other paraganglia: Secondary | ICD-10-CM | POA: Diagnosis not present

## 2015-05-14 ENCOUNTER — Ambulatory Visit
Admission: RE | Admit: 2015-05-14 | Discharge: 2015-05-14 | Disposition: A | Discharge status: Home / Self Care | Payer: 59 | Source: Ambulatory Visit | Attending: Radiation Oncology | Admitting: Radiation Oncology

## 2015-05-14 ENCOUNTER — Encounter: Payer: Self-pay | Admitting: *Deleted

## 2015-05-14 ENCOUNTER — Telehealth: Payer: Self-pay | Admitting: *Deleted

## 2015-05-14 VITALS — BP 127/69 | HR 62 | Temp 97.7°F | Resp 12 | Wt 213.0 lb

## 2015-05-14 DIAGNOSIS — Z510 Encounter for antineoplastic radiation therapy: Secondary | ICD-10-CM | POA: Insufficient documentation

## 2015-05-14 DIAGNOSIS — R51 Headache: Secondary | ICD-10-CM | POA: Diagnosis not present

## 2015-05-14 DIAGNOSIS — D447 Neoplasm of uncertain behavior of aortic body and other paraganglia: Secondary | ICD-10-CM | POA: Diagnosis not present

## 2015-05-14 DIAGNOSIS — D446 Neoplasm of uncertain behavior of carotid body: Secondary | ICD-10-CM | POA: Insufficient documentation

## 2015-05-14 DIAGNOSIS — R2689 Other abnormalities of gait and mobility: Secondary | ICD-10-CM | POA: Insufficient documentation

## 2015-05-14 MED ORDER — BIAFINE EX EMUL
Freq: Once | CUTANEOUS | Status: AC
  Administered 2015-05-14: 18:00:00 via TOPICAL

## 2015-05-14 NOTE — Telephone Encounter (Signed)
CALLED PATIENT TO INFORM OF NEURO APPT. WITH DR. Lanny Hurst WILLIS ON 07-25-15 - ARRIVAL TIME - 10:45 AM, SPOKE WITH PATIENT AND SHE IS AWARE OF THIS APPT.

## 2015-05-14 NOTE — Addendum Note (Signed)
Encounter addended by: Jacqulyn Liner, RN on: 05/14/2015  6:18 PM<BR>     Documentation filed: Medications, Dx Association, Inpatient MAR, Orders

## 2015-05-14 NOTE — Progress Notes (Signed)
She is currently in no pain. Pt complains of fatigue. Very tired, took Valium 10am.   Pt denies dysphagia. Pt reports a regular unmodified diet orally. Oral exam reveals dry mouth. Skin exam reveals warm dry skin.  Pt reports occasional constipation. Reports a bowel movement soft every day.  Constipation Handout given. Wt Readings from Last 3 Encounters:  05/14/15 213 lb (96.616 kg)  04/30/15 214 lb 2 oz (97.126 kg)  04/18/15 207 lb 8 oz (94.121 kg)   BP 127/69 mmHg  Pulse 62  Temp(Src) 97.7 F (36.5 C) (Oral)  Resp 12  Wt 213 lb (96.616 kg)  SpO2 100%  LMP 11/03/2012

## 2015-05-14 NOTE — Progress Notes (Signed)
   Weekly Management Note:  Outpatient    ICD-9-CM ICD-10-CM   1. Paraganglioma, carotid body 237.3 D44.6     Current Dose:  1.8 Gy  Projected Dose: 45 Gy   Narrative:  The patient presents for routine under treatment assessment.  CBCT/MVCT images/Port film x-rays were reviewed.  The chart was checked.  Doing well, but had HAs a couple weeks ago, severe, and left ED AMA. Radiate from right face/head to left temple. She reports they are better, now. But, she has had balance issues as well  Physical Findings:    Vitals with Age-Percentiles 6/81/2751  Length   Systolic 700  Diastolic 69  Pulse 62  Respiration 12  Weight 96.616 kg  BMI   VISIT REPORT    Wt Readings from Last 3 Encounters:  05/14/15 213 lb (96.616 kg)  04/30/15 214 lb 2 oz (97.126 kg)  04/18/15 207 lb 8 oz (94.121 kg)   NAD, ambulatory  Impression:  The patient is tolerating radiotherapy.  Plan:  Continue radiotherapy as planned. Refer to neurology for issues above. Unclear if they are related to paraganglioma.   ________________________________   Eppie Gibson, M.D.

## 2015-05-14 NOTE — Progress Notes (Signed)

## 2015-05-14 NOTE — Progress Notes (Signed)
IMRT Device Note    ICD-9-CM ICD-10-CM   1. Paraganglioma, carotid body 237.3 D44.6     5.2 delivered field widths represent one set of IMRT treatment devices. The code is [phone].  -----------------------------------  Tasha Gibson, MD

## 2015-05-14 NOTE — Progress Notes (Signed)
Oncology Nurse Navigator Documentation  Oncology Nurse Navigator Flowsheets 05/14/2015  Patient Visit Type Radonc  Treatment Phase First Radiation Tx  To provide support and encouragement, care continuity and to assess for needs, met with patient during [place].    She took 1 tab 5 mg Valium ca. 15 min before tmt.  We discussed tmt procedure, she verbalized understanding.    She tolerated tmt without incident.  I recognized her accomplishment, she thanked me for my support.  I escorted her to Nursing for weekly UT with Dr. Isidore Moos.   Interventions Education Method  Education Method Verbal   [name], RN, BSN, [place] at [place] [phone]

## 2015-05-14 NOTE — Progress Notes (Signed)
SKIN CARE DURING RADIATION TREATMENT-HEAD AND NECK  RECOMMENDATIONS: ? Use unscented soap (Dove) ? When showering it is fine for water to touch the area, but please avoid direct spray on the treatment field.  Also, wash inside and around the marked area ? When drying gently blot the area ? Avoid using lotions, oils or powders as well as products with alcohol ? If you shave, use an electric razor and DO NOT use pre or after shave lotion  ? Moisturizer o You may be given Radiaplex Gel or Biafine (provided by nursing) to use. Apply twice daily, once after treatment and then again prior to bedtime o Your Radiation Oncologist may suggest other skin care products ? PLEASE DO NOT APPLY ANYTHING TO THE TREATMENT AREA SKIN WITH 4 HOURS  PRIOR TO RADIATION  ? Mouth care o Soothing relief: rinse your mouth every 1-2 hours with a solution of  teaspoon baking soda and 1/8 teaspoon salt mixed in 1 cup of warm water o DO NOT use mouthwashes that contain alcohol, try using BIOTENE instead                       

## 2015-05-14 NOTE — Progress Notes (Signed)
Pt here for patient teaching.  Pt given Radiation and You booklet and skin care instructions. Reviewed areas of pertinence such as fatigue, hair loss, skin changes, throat changes, earaches and taste changes . Pt able to give teach back of to pat skin and use unscented/gentle soap,Biafine bid,  and avoid applying anything to skin within 4 hours of treatment. Pt demonstrated understanding of information given and will contact nursing with any questions or concerns.

## 2015-05-14 NOTE — Addendum Note (Signed)
Encounter addended by: Jenene Slicker, RN on: 05/14/2015 11:28 AM<BR>     Documentation filed: Notes Section

## 2015-05-15 ENCOUNTER — Ambulatory Visit
Admission: RE | Admit: 2015-05-15 | Discharge: 2015-05-15 | Disposition: A | Discharge status: Home / Self Care | Payer: 59 | Source: Ambulatory Visit | Attending: Radiation Oncology | Admitting: Radiation Oncology

## 2015-05-15 DIAGNOSIS — D447 Neoplasm of uncertain behavior of aortic body and other paraganglia: Secondary | ICD-10-CM | POA: Diagnosis not present

## 2015-05-16 ENCOUNTER — Ambulatory Visit
Admission: RE | Admit: 2015-05-16 | Discharge: 2015-05-16 | Disposition: A | Discharge status: Home / Self Care | Payer: 59 | Source: Ambulatory Visit | Attending: Radiation Oncology | Admitting: Radiation Oncology

## 2015-05-16 DIAGNOSIS — D447 Neoplasm of uncertain behavior of aortic body and other paraganglia: Secondary | ICD-10-CM | POA: Diagnosis not present

## 2015-05-17 ENCOUNTER — Ambulatory Visit
Admission: RE | Admit: 2015-05-17 | Discharge: 2015-05-17 | Disposition: A | Discharge status: Home / Self Care | Payer: 59 | Source: Ambulatory Visit | Attending: Radiation Oncology | Admitting: Radiation Oncology

## 2015-05-17 DIAGNOSIS — D447 Neoplasm of uncertain behavior of aortic body and other paraganglia: Secondary | ICD-10-CM | POA: Diagnosis not present

## 2015-05-18 ENCOUNTER — Ambulatory Visit
Admission: RE | Admit: 2015-05-18 | Discharge: 2015-05-18 | Disposition: A | Discharge status: Home / Self Care | Payer: 59 | Source: Ambulatory Visit | Attending: Radiation Oncology | Admitting: Radiation Oncology

## 2015-05-18 DIAGNOSIS — D447 Neoplasm of uncertain behavior of aortic body and other paraganglia: Secondary | ICD-10-CM | POA: Diagnosis not present

## 2015-05-22 ENCOUNTER — Encounter: Payer: Self-pay | Admitting: *Deleted

## 2015-05-22 ENCOUNTER — Ambulatory Visit: Admission: RE | Admit: 2015-05-22 | Payer: 59 | Source: Ambulatory Visit

## 2015-05-22 NOTE — Progress Notes (Signed)
Oncology Nurse Navigator Documentation  Oncology Nurse Navigator Flowsheets 05/14/2015 05/22/2015 05/22/2015  Navigator Encounter Type - Treatment Treatment  Patient Visit Type Radonc Radonc -  Treatment Phase First Radiation Tx Treatment -  Interventions Education Method - -  Education Method Verbal - -  Support Groups/Services - Other Other  I was paged by Alanda Amass RTT, patient unable to complete tmt after several attempts. I met with patient, she stated: 1. She was having significant difficult with swallowing, "mask is too tight around neck".   2. She took 1/2 Valium before coming this afternoon, she will take an entire tablet tomorrow, will have someone with her to drive. She expressed discouragement with today's tmt outcome, I recognized her fortitude in coming twice for tmt, I assured her something will be worked out so future tmts are successful.  She expressed appreciation for my support.    Time Spent with Patient - [place], Therapist, sports, BSN, [place] at [place] [phone]

## 2015-05-22 NOTE — Progress Notes (Signed)
Oncology Nurse Navigator Documentation  Oncology Nurse Navigator Flowsheets 05/14/2015 05/22/2015  Navigator Encounter Type - Treatment  Patient Visit Type Radonc Radonc  Treatment Phase First Radiation Tx Treatment  Interventions Education Method -  Education Method Verbal -  Support Groups/Services - Other  I was paged by Alanda Amass RTT, that patient having difficult time with RT.   I met with her in dsg room, she stated she was having:  1) difficulty swallowing, "mouth dry".  We discussed option of taking sip of water, swish and spit water , immediately before tmt. 2) personal issues at home that were hindering her ability to focus on tmt today.  I offered to notify LCSW or chaplain to see if they could meet with her, she refused offer, "I just need to get myself back together".  She agreed to return at 1400 to attempt tmt.    Time Spent with Patient - [place], RN, BSN, [place] at [place] [phone]

## 2015-05-23 ENCOUNTER — Ambulatory Visit
Admission: RE | Admit: 2015-05-23 | Discharge: 2015-05-23 | Disposition: A | Discharge status: Home / Self Care | Payer: 59 | Source: Ambulatory Visit | Attending: Radiation Oncology | Admitting: Radiation Oncology

## 2015-05-23 VITALS — BP 132/88 | HR 73 | Temp 97.7°F | Resp 12 | Wt 213.9 lb

## 2015-05-23 DIAGNOSIS — D446 Neoplasm of uncertain behavior of carotid body: Secondary | ICD-10-CM

## 2015-05-23 DIAGNOSIS — D447 Neoplasm of uncertain behavior of aortic body and other paraganglia: Secondary | ICD-10-CM | POA: Diagnosis not present

## 2015-05-23 NOTE — Progress Notes (Signed)
She is currently in no pain. No complaints. Pt denies dysphagia. Pt reports a regular unmodified diet orally. Oral exam: reports a dry mouth. Skin exam reveals: slight Pruritus.  Reports treatment mask was too tight this morning and was pushing on her esophagus.  Mask was altered to relieve discomfort.  Wt Readings from Last 3 Encounters:  05/23/15 213 lb 14.4 oz (97.024 kg)  04/30/15 214 lb 2 oz (97.126 kg)  04/18/15 207 lb 8 oz (94.121 kg)   BP 132/88 mmHg  Pulse 73  Temp(Src) 97.7 F (36.5 C) (Oral)  Resp 12  Wt 213 lb 14.4 oz (97.024 kg)  SpO2 100%  LMP 11/03/2012

## 2015-05-23 NOTE — Progress Notes (Signed)
    Weekly Management Note:  Outpatient    ICD-9-CM ICD-10-CM   1. 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 Pt denies dysphagia. Pt reports a regular unmodified diet orally. Oral exam: reports a dry mouth. Skin exam reveals: slight Pruritus. Reports treatment mask was too tight this morning and was pushing on her esophagus.  Mask was altered to relieve discomfort.  "Feels like a lump is there sometimes," when referring to neck. Confirms there is no soreness in her throat.  Physical Findings:    Filed Vitals:   05/23/15 1030  BP: 132/88  Pulse: 73  Temp: 97.7 F (36.5 C)  Resp: 12       Wt Readings from Last 3 Encounters:  05/23/15 213 lb 14.4 oz (97.024 kg)  04/30/15 214 lb 2 oz (97.126 kg)  04/18/15 207 lb 8 oz (94.121 kg)   NAD, ambulatory, no skin irritation  Impression:  The patient is tolerating radiotherapy.  Plan:  Continue radiotherapy as planned.    This document serves as a record of services personally performed by Eppie Gibson, MD. It was created on her behalf by Lenn Cal, a trained medical scribe. The creation of this record is based on the scribe's personal observations and the provider's statements to them. This document has been checked and approved by the attending provider.   ________________________________   Eppie Gibson, M.D.

## 2015-05-24 ENCOUNTER — Encounter: Payer: Self-pay | Admitting: *Deleted

## 2015-05-24 ENCOUNTER — Ambulatory Visit
Admission: RE | Admit: 2015-05-24 | Discharge: 2015-05-24 | Disposition: A | Discharge status: Home / Self Care | Payer: 59 | Source: Ambulatory Visit | Attending: Radiation Oncology | Admitting: Radiation Oncology

## 2015-05-24 DIAGNOSIS — D447 Neoplasm of uncertain behavior of aortic body and other paraganglia: Secondary | ICD-10-CM | POA: Diagnosis not present

## 2015-05-24 NOTE — Progress Notes (Signed)
Oncology Nurse Navigator Documentation  Oncology Nurse Navigator Flowsheets 05/14/2015 05/22/2015 05/22/2015 05/24/2015  Navigator Encounter Type - Treatment Treatment Treatment  Patient Visit Type Radonc Radonc - -  Treatment Phase First Radiation Tx Treatment - -  Interventions Education Method - - -  Education Method Verbal - - -  Support Groups/Services - Other Other Other  To provide support and encouragement, met with patient during [place] tmt.  She completed tmt but recognized that she was very anxious stating she forgot to take a Valium.  She stated she would take a Valium before tomorrow's tmt, arrange for someone to bring her.  Time Spent with Patient - 30 30 [place], Therapist, sports, Copywriter, advertising, [place] at [place] [phone]

## 2015-05-24 NOTE — Progress Notes (Signed)
Entry error

## 2015-05-25 ENCOUNTER — Ambulatory Visit
Admission: RE | Admit: 2015-05-25 | Discharge: 2015-05-25 | Disposition: A | Discharge status: Home / Self Care | Payer: 59 | Source: Ambulatory Visit | Attending: Radiation Oncology | Admitting: Radiation Oncology

## 2015-05-25 DIAGNOSIS — D447 Neoplasm of uncertain behavior of aortic body and other paraganglia: Secondary | ICD-10-CM | POA: Diagnosis not present

## 2015-05-28 ENCOUNTER — Ambulatory Visit
Admission: RE | Admit: 2015-05-28 | Discharge: 2015-05-28 | Disposition: A | Discharge status: Home / Self Care | Payer: 59 | Source: Ambulatory Visit | Attending: Radiation Oncology | Admitting: Radiation Oncology

## 2015-05-28 ENCOUNTER — Encounter: Payer: Self-pay | Admitting: Radiation Oncology

## 2015-05-28 VITALS — BP 124/72 | HR 64 | Temp 97.2°F

## 2015-05-28 DIAGNOSIS — D446 Neoplasm of uncertain behavior of carotid body: Secondary | ICD-10-CM

## 2015-05-28 DIAGNOSIS — D447 Neoplasm of uncertain behavior of aortic body and other paraganglia: Secondary | ICD-10-CM | POA: Diagnosis not present

## 2015-05-28 MED ORDER — LIDOCAINE VISCOUS 2 % MT SOLN: OROMUCOSAL | Status: DC

## 2015-05-28 MED ORDER — SUCRALFATE 1 G PO TABS: ORAL_TABLET | ORAL | Status: DC

## 2015-05-28 NOTE — Progress Notes (Addendum)
    Weekly Management Note:  Outpatient    ICD-9-CM ICD-10-CM   1. 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 has received 9 fractions to her right Carotid.  She c/o level 5/10 sore throat and stated she crushed up and swallowed several Ibuprofen tabs with relief.  Would like a refill on her Hydrocodone at this time.  VSS.  Physical Findings:    Filed Vitals:   05/28/15 1830  BP: 124/72  Pulse: 64  Temp: 97.2 F (36.2 C)    NAD, ambulatory, no skin irritation oropharynx clear  Impression:  The patient is tolerating radiotherapy.  Plan:  Continue radiotherapy as planned.  I do not recommend hydrocodone at this time but discussed the above Rx's instead. ^ She is agreeable  Do not crush ibuprofen     ________________________________   Eppie Gibson, M.D.

## 2015-05-28 NOTE — Progress Notes (Signed)
Tasha [name] has received 9 fractions to her right Carotid.  She c/o level 5/10 sore throat and stated she crushed up and swallowed several Ibuprofen tabs with relief.  Would like a refill on her Hydrocodone at this time.  VSS.

## 2015-05-29 ENCOUNTER — Encounter: Payer: Self-pay | Admitting: *Deleted

## 2015-05-29 ENCOUNTER — Telehealth: Payer: Self-pay | Admitting: *Deleted

## 2015-05-29 ENCOUNTER — Ambulatory Visit
Admission: RE | Admit: 2015-05-29 | Discharge: 2015-05-29 | Disposition: A | Discharge status: Home / Self Care | Payer: 59 | Source: Ambulatory Visit | Attending: Radiation Oncology | Admitting: Radiation Oncology

## 2015-05-29 DIAGNOSIS — D447 Neoplasm of uncertain behavior of aortic body and other paraganglia: Secondary | ICD-10-CM | POA: Diagnosis not present

## 2015-05-29 NOTE — Telephone Encounter (Signed)
Oncology Nurse Navigator Documentation  Oncology Nurse Navigator Flowsheets 05/29/2015 05/29/2015  Navigator Encounter Type Other Telephone  Called patient to check on well-being.  LVM asking her to return my call.  Patient Visit Type - -  Treatment Phase - -  Interventions Other -  Education Method - -  Support Groups/Services - -  Time Spent with Patient [place], Therapist, sports, BSN, [place] at [place] [phone]

## 2015-05-29 NOTE — Telephone Encounter (Signed)
Oncology Nurse Navigator Documentation  Oncology Nurse Navigator Flowsheets 05/29/2015 05/29/2015 05/29/2015  Navigator Encounter Type Other Telephone Telephone  Patient LVM at 1146 indicating she had just returned home after an errand.  She stated "I am fine, I appreciate everyone's concern".   I informed Jehnna RTT.  Patient Visit Type - - -  Treatment Phase - - -  Interventions Other - -  Education Method - - -  Support Groups/Services - - -  Time Spent with Patient 30 15 15    [name], RN, BSN, [place] at [place] [phone]

## 2015-05-29 NOTE — Progress Notes (Signed)
Oncology Nurse Navigator Documentation  Oncology Nurse Navigator Flowsheets 05/29/2015  Navigator Encounter Type Other  Patient Visit Type -  Treatment Phase -  Interventions Other  Received page from [facility]  indicating concern for pt safety.  I met patient and Alm Bustard at elevator, patient indicated she had taken 1 T Valium prior to RT.  Alm Bustard stated she had offered pt opportunity to rest in nursing prior to leaving RadOnc, pt declined.  I encouraged her to stay in [facility] lobby for period of time to insure her ability to drive home, she declined.  She stated she has driven herself before after taking Valium and has never had a problem.  She said she would call me when she arrives home safely.  Education Method -  Support Groups/Services -  Time Spent with Patient 30

## 2015-05-30 ENCOUNTER — Ambulatory Visit
Admission: RE | Admit: 2015-05-30 | Discharge: 2015-05-30 | Disposition: A | Discharge status: Home / Self Care | Payer: 59 | Source: Ambulatory Visit | Attending: Radiation Oncology | Admitting: Radiation Oncology

## 2015-05-30 DIAGNOSIS — D447 Neoplasm of uncertain behavior of aortic body and other paraganglia: Secondary | ICD-10-CM | POA: Diagnosis not present

## 2015-05-31 ENCOUNTER — Other Ambulatory Visit (HOSPITAL_COMMUNITY): Payer: Self-pay | Admitting: Internal Medicine

## 2015-05-31 ENCOUNTER — Ambulatory Visit
Admission: RE | Admit: 2015-05-31 | Discharge: 2015-05-31 | Disposition: A | Discharge status: Home / Self Care | Payer: 59 | Source: Ambulatory Visit | Attending: Radiation Oncology | Admitting: Radiation Oncology

## 2015-05-31 DIAGNOSIS — Z1231 Encounter for screening mammogram for malignant neoplasm of breast: Secondary | ICD-10-CM

## 2015-05-31 DIAGNOSIS — D447 Neoplasm of uncertain behavior of aortic body and other paraganglia: Secondary | ICD-10-CM | POA: Diagnosis not present

## 2015-06-01 ENCOUNTER — Ambulatory Visit
Admission: RE | Admit: 2015-06-01 | Discharge: 2015-06-01 | Disposition: A | Discharge status: Home / Self Care | Payer: 59 | Source: Ambulatory Visit | Attending: Radiation Oncology | Admitting: Radiation Oncology

## 2015-06-01 DIAGNOSIS — D447 Neoplasm of uncertain behavior of aortic body and other paraganglia: Secondary | ICD-10-CM | POA: Diagnosis not present

## 2015-06-04 ENCOUNTER — Ambulatory Visit
Admission: RE | Admit: 2015-06-04 | Discharge: 2015-06-04 | Disposition: A | Discharge status: Home / Self Care | Payer: 59 | Source: Ambulatory Visit | Attending: Radiation Oncology | Admitting: Radiation Oncology

## 2015-06-04 ENCOUNTER — Encounter: Payer: Self-pay | Admitting: Radiation Oncology

## 2015-06-04 VITALS — BP 143/63 | HR 53 | Temp 97.9°F | Resp 12 | Wt 214.7 lb

## 2015-06-04 DIAGNOSIS — D447 Neoplasm of uncertain behavior of aortic body and other paraganglia: Secondary | ICD-10-CM | POA: Diagnosis not present

## 2015-06-04 DIAGNOSIS — D446 Neoplasm of uncertain behavior of carotid body: Secondary | ICD-10-CM

## 2015-06-04 NOTE — Progress Notes (Signed)
    Weekly Management Note:  Outpatient    ICD-9-CM ICD-10-CM   1. 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 has received 14/25 fractions  She rates her pain as a 7 on a scale of 0-10. constant and sore over throat. Pt has had dysphagia for liquids. Pt reports a regular unmodified diet orally. Oral exam reveals mucous membranes moist and thick white sputum. Pt reports a bowel movement every day. Skin exam reveals Hyperpigmentation, Pruritus and erythema. Biafine bid. Pt complains of fatigue. She was curious about whether what is in her neck is cancerous. The Sucralfate hasn't been helping her sore throat. She has been taking ibuprofen to help with the throat inflammation.  She has noticed some 'speckling' on her tongue that started when radiation began, it doesn't cause any discomfort. She worries it may be from previous marijuana use.    Physical Findings:    Filed Vitals:   06/04/15 1019  BP: 143/63  Pulse: 53  Temp: 97.9 F (36.6 C)  Resp: 12    NAD.   hyperpigmented speckles on tongue appear benign and are not raised. She has no skin changes over her neck thus far.   Impression:  The patient is tolerating radiotherapy.  Plan:  Continue radiotherapy as planned.  Discussed using her previously prescribed lidocaine to numb her throat to help with the soreness. Potential referral to [facility] in regards to tongue speckling;if this persists, patient will let me know.  This document serves as a record of services personally performed by Eppie Gibson, MD. It was created on her behalf by Arlyce Harman, a trained medical scribe. The creation of this record is based on the scribe's personal observations and the provider's statements to them. This document has been checked and approved by the attending provider. ________________________________   Eppie Gibson, M.D.

## 2015-06-04 NOTE — Progress Notes (Addendum)
PAIN: She rates her pain as a 7 on a scale of 0-10. constant and sore over throat  SWALLOWING/DIET: Pt has had dysphagia for liquids. Pt reports a regular unmodified diet orally. Oral exam reveals mucous membranes moist and thick white sputum.  BOWEL: Pt reports a bowel movement every day. SKIN: Skin exam reveals Hyperpigmentation, Pruritus and erythema. Biafine bid OTHER: Pt complains of fatigue. She reports the Carafate is "terrible" and she doesn't want to take it.  She instead will do the mouth rinse.   WEIGHT/VS: Wt Readings from Last 3 Encounters:  06/04/15 214 lb 11.2 oz (97.387 kg)  05/23/15 213 lb 14.4 oz (97.024 kg)  05/14/15 213 lb (96.616 kg)   BP 143/63 mmHg  Pulse 53  Temp(Src) 97.9 F (36.6 C) (Oral)  Resp 12  Wt 214 lb 11.2 oz (97.387 kg)  SpO2 100%  LMP 11/03/2012

## 2015-06-05 ENCOUNTER — Ambulatory Visit
Admission: RE | Admit: 2015-06-05 | Discharge: 2015-06-05 | Disposition: A | Discharge status: Home / Self Care | Payer: 59 | Source: Ambulatory Visit | Attending: Radiation Oncology | Admitting: Radiation Oncology

## 2015-06-05 DIAGNOSIS — D447 Neoplasm of uncertain behavior of aortic body and other paraganglia: Secondary | ICD-10-CM | POA: Diagnosis not present

## 2015-06-06 ENCOUNTER — Ambulatory Visit
Admission: RE | Admit: 2015-06-06 | Discharge: 2015-06-06 | Disposition: A | Discharge status: Home / Self Care | Payer: 59 | Source: Ambulatory Visit | Attending: Radiation Oncology | Admitting: Radiation Oncology

## 2015-06-06 DIAGNOSIS — D447 Neoplasm of uncertain behavior of aortic body and other paraganglia: Secondary | ICD-10-CM | POA: Diagnosis not present

## 2015-06-07 ENCOUNTER — Ambulatory Visit
Admission: RE | Admit: 2015-06-07 | Discharge: 2015-06-07 | Disposition: A | Discharge status: Home / Self Care | Payer: 59 | Source: Ambulatory Visit | Attending: Radiation Oncology | Admitting: Radiation Oncology

## 2015-06-07 DIAGNOSIS — D447 Neoplasm of uncertain behavior of aortic body and other paraganglia: Secondary | ICD-10-CM | POA: Diagnosis not present

## 2015-06-08 ENCOUNTER — Ambulatory Visit
Admission: RE | Admit: 2015-06-08 | Discharge: 2015-06-08 | Disposition: A | Discharge status: Home / Self Care | Payer: 59 | Source: Ambulatory Visit | Attending: Radiation Oncology | Admitting: Radiation Oncology

## 2015-06-08 DIAGNOSIS — D447 Neoplasm of uncertain behavior of aortic body and other paraganglia: Secondary | ICD-10-CM | POA: Diagnosis not present

## 2015-06-11 ENCOUNTER — Ambulatory Visit
Admission: RE | Admit: 2015-06-11 | Discharge: 2015-06-11 | Disposition: A | Discharge status: Home / Self Care | Payer: 59 | Source: Ambulatory Visit | Attending: Radiation Oncology | Admitting: Radiation Oncology

## 2015-06-11 ENCOUNTER — Encounter: Payer: Self-pay | Admitting: *Deleted

## 2015-06-11 ENCOUNTER — Encounter: Payer: Self-pay | Admitting: Radiation Oncology

## 2015-06-11 VITALS — BP 151/72 | HR 87 | Temp 98.0°F | Resp 12 | Wt 213.3 lb

## 2015-06-11 DIAGNOSIS — D447 Neoplasm of uncertain behavior of aortic body and other paraganglia: Secondary | ICD-10-CM | POA: Diagnosis not present

## 2015-06-11 DIAGNOSIS — D446 Neoplasm of uncertain behavior of carotid body: Secondary | ICD-10-CM

## 2015-06-11 NOTE — Progress Notes (Signed)
Weekly Management Note:  Site: right neck Current Dose:   3420  cGy Projected Dose:  4500  cGy  Narrative: The patient is seen today for routine under treatment assessment. CBCT/MVCT images/port films were reviewed. The chart was reviewed.    She is doing doing well and is without complaints today  Except for numerous pigmented spots along her tongue. These began approximately 3-4 weeks ago when she began her radiation therapy. She has not been  on anti-biotics recently.  Physical Examination:  Filed Vitals:   06/11/15 1311  BP: 151/72  Pulse: 87  Temp: 98 F (36.7 C)  Resp: 12  .  Weight: 213 lb 4.8 oz (96.752 kg).  There is slight hyperpigmentation the skin along the right neck.  No masses are appreciated.  On inspection oral cavity there are pigmented lesions along her tongue of uncertain etiology and significance.  Impression: Tolerating radiation therapy well.  I'm not sure as to the etiology of her pigmented lesions along her tongue but this may be secondary to low dose scatter radiation of melanocytes  within the tongue.  I doubt that she has a fungal infection. She tells me she will see her dentist.  Plan: Continue radiation therapy as planned.

## 2015-06-11 NOTE — Progress Notes (Signed)
Oncology Nurse Navigator Documentation  Oncology Nurse Navigator Flowsheets 06/11/2015  Navigator Encounter Type Clinic/MDC  Patient Visit Type Radonc  To provide support and encouragement, care continuity and to assess for needs, met with patient after RT and during weekly UT with Dr. Valere Dross.  She reported:  Reasonable oral intake - drinking water, eating chicken w/ rice, rice with cream chicken, applesauce, jello.  Thickened saliva, is using salt water / baking soda rinse to help manage.  Denied pain though some throat soreness.  Stated she is not applying Biafine.  I encouraged her to apply BID.  Black spots on tongue as previously reported to Dr. Isidore Moos.  Dr. Valere Dross offered spots may reflect RT activation of melanocyte production of melanin.  He suggested she consult with Dr. Enrique Sack, Dental Medicine.  She indicated she would contact her dentist. She denied any needs or concerns; I encouraged her to contact me if that changes before I see her next, she verbalized understanding.    Treatment Phase -  Interventions -  Education Method -  Support Groups/Services -  Time Spent with Patient [place], RN, BSN, [place] at [place] [phone]

## 2015-06-11 NOTE — Progress Notes (Addendum)
PAIN: She is currently in no pain. Sore throat, but taking pain medication to treat.  SWALLOWING/DIET: Pt has had dysphagia for both solids and liquids. She hasn't been wearing her dentures. Pt reports a soft- regular unmodified diet orally. Oral exam reveals mucous membranes moist and with white and tenacious sputum. Noted large amount of black "speckling" on anterior portion of tongue.  BOWEL: Pt reports a soft bowel movement every day. SKIN: Skin exam reveals Hyperpigmentation and slight Pruritus. Pt reports they aren't using the Biafine as directed, but does have it.  Encouraged use.   OTHER: Pt complains of fatigue.   WEIGHT/VS: Wt Readings from Last 3 Encounters:  06/11/15 213 lb 4.8 oz (96.752 kg)  06/04/15 214 lb 11.2 oz (97.387 kg)  05/23/15 213 lb 14.4 oz (97.024 kg)   BP 151/72 mmHg  Pulse 87  Temp(Src) 98 F (36.7 C) (Oral)  Resp 12  Wt 213 lb 4.8 oz (96.752 kg)  SpO2 100%  LMP 11/03/2012

## 2015-06-12 ENCOUNTER — Ambulatory Visit
Admission: RE | Admit: 2015-06-12 | Discharge: 2015-06-12 | Disposition: A | Discharge status: Home / Self Care | Payer: 59 | Source: Ambulatory Visit | Attending: Radiation Oncology | Admitting: Radiation Oncology

## 2015-06-12 DIAGNOSIS — D447 Neoplasm of uncertain behavior of aortic body and other paraganglia: Secondary | ICD-10-CM | POA: Diagnosis not present

## 2015-06-13 ENCOUNTER — Ambulatory Visit
Admission: RE | Admit: 2015-06-13 | Discharge: 2015-06-13 | Disposition: A | Discharge status: Home / Self Care | Payer: 59 | Source: Ambulatory Visit | Attending: Radiation Oncology | Admitting: Radiation Oncology

## 2015-06-13 DIAGNOSIS — D447 Neoplasm of uncertain behavior of aortic body and other paraganglia: Secondary | ICD-10-CM | POA: Diagnosis not present

## 2015-06-14 ENCOUNTER — Ambulatory Visit
Admission: RE | Admit: 2015-06-14 | Discharge: 2015-06-14 | Disposition: A | Discharge status: Home / Self Care | Payer: 59 | Source: Ambulatory Visit | Attending: Radiation Oncology | Admitting: Radiation Oncology

## 2015-06-14 DIAGNOSIS — D447 Neoplasm of uncertain behavior of aortic body and other paraganglia: Secondary | ICD-10-CM | POA: Diagnosis not present

## 2015-06-15 ENCOUNTER — Ambulatory Visit
Admission: RE | Admit: 2015-06-15 | Discharge: 2015-06-15 | Disposition: A | Discharge status: Home / Self Care | Payer: 59 | Source: Ambulatory Visit | Attending: Radiation Oncology | Admitting: Radiation Oncology

## 2015-06-15 DIAGNOSIS — D447 Neoplasm of uncertain behavior of aortic body and other paraganglia: Secondary | ICD-10-CM | POA: Diagnosis not present

## 2015-06-18 ENCOUNTER — Encounter: Payer: Self-pay | Admitting: Radiation Oncology

## 2015-06-18 ENCOUNTER — Ambulatory Visit: Payer: 59

## 2015-06-18 ENCOUNTER — Encounter: Payer: Self-pay | Admitting: *Deleted

## 2015-06-18 ENCOUNTER — Ambulatory Visit
Admission: RE | Admit: 2015-06-18 | Discharge: 2015-06-18 | Disposition: A | Discharge status: Home / Self Care | Payer: 59 | Source: Ambulatory Visit | Attending: Radiation Oncology | Admitting: Radiation Oncology

## 2015-06-18 ENCOUNTER — Telehealth: Payer: Self-pay | Admitting: *Deleted

## 2015-06-18 ENCOUNTER — Other Ambulatory Visit (HOSPITAL_COMMUNITY): Payer: Self-pay | Admitting: Radiation Oncology

## 2015-06-18 VITALS — BP 147/90 | HR 66 | Temp 97.9°F | Resp 12 | Wt 205.4 lb

## 2015-06-18 DIAGNOSIS — D447 Neoplasm of uncertain behavior of aortic body and other paraganglia: Secondary | ICD-10-CM

## 2015-06-18 DIAGNOSIS — D446 Neoplasm of uncertain behavior of carotid body: Secondary | ICD-10-CM

## 2015-06-18 NOTE — Progress Notes (Addendum)
PAIN: She is currently in no pain. Reports painful swallowing rating an 8, described as "pulling." Reports she is not using Carafate or Lidocaine.  Only is using salt and sodium rinses, BID.  SWALLOWING/DIET: Pt has had dysphagia for liquids. Pt reports a regular unmodified diet orally (soft). Oral exam reveals continued black "speckling" on tongue with white and tenacious sputum. Reports tooth pain on the right side. BOWEL: Pt reports Nausea on last Monday.  Reports a soft bowel movement every other day. SKIN: Skin exam reveals Hyperpigmentation and Pruritus over neck.  Noted dryness and white patches on chin. Pt continues to apply Biafine as directed. OTHER: Pt complains of fatigue. Reports appetite is good, but has taste changed.   WEIGHT/VS: Wt Readings from Last 3 Encounters:  06/18/15 205 lb 6.4 oz (93.169 kg)  06/11/15 213 lb 4.8 oz (96.752 kg)  06/04/15 214 lb 11.2 oz (97.387 kg)   BP 147/106 mmHg  Pulse 61  Temp(Src) 97.9 F (36.6 C) (Oral)  Resp 12  Wt 205 lb 6.4 oz (93.169 kg)  SpO2 100%  LMP 11/03/2012 Orthostatic BP: 147/90 P: 66 Pox: 100%

## 2015-06-18 NOTE — Progress Notes (Signed)
Oncology Nurse Navigator Documentation  Oncology Nurse Navigator Flowsheets 06/18/2015  Navigator Encounter Type Clinic/MDC;Treatment  Patient Visit Type Radonc  To provide support and encouragement, care continuity and to assess for needs, met with patient during weekly UT with Dr. Isidore Moos.  Pt's final RT is tomorrow. In response to her inquiry, I educated her re:  What she can expect during the next couple of weeks s/p final RT.  Projected return of taste buds. She reported:  Throat pain with swallowing.  She is not using Carafate and lidocaine to relieve throat pain, stating she prefers to "ride or die", i.e tough it out.  Application of Biafine BID.  Use of salt water / baking soda rinse in AM and before bed.  Good appetite but cannot taste food.  Water tastes "nasty".  Occasional choking on food, denies aspirating She verbalized understanding of Dr. Pearlie Oyster reasoning for referring her for [facility] and SLP with Garald Balding.    Treatment Phase -  Interventions -  Education Method -  Support Groups/Services -  Time Spent with Patient [place], RN, BSN, [place] at [place] [phone]

## 2015-06-18 NOTE — Progress Notes (Signed)
    Weekly Management Note:  Outpatient    ICD-9-CM ICD-10-CM   1. 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 Reports painful swallowing rating an 8, described as "pulling." Reports she is not using Carafate or Lidocaine.  Only is using salt and sodium rinses, BID and prefers not to take any meds.  SWALLOWING/DIET: Pt has had dysphagia for liquids. Pt reports a regular unmodified diet orally (soft). Oral exam reveals continued black "speckling" on tongue with white and tenacious sputum. Reports tooth pain on the right side. BOWEL: Pt reports Nausea on last Monday.  Reports a soft bowel movement every other day. SKIN: Skin exam reveals Hyperpigmentation and Pruritus over neck.  Noted dryness and white patches on chin. Pt continues to apply Biafine as directed. OTHER: Pt complains of fatigue. Reports appetite is good, but has taste changed.    WEIGHT/VS: Wt Readings from Last 3 Encounters:  06/18/15 205 lb 6.4 oz (93.169 kg)  06/11/15 213 lb 4.8 oz (96.752 kg)  06/04/15 214 lb 11.2 oz (97.387 kg)   BP 147/90 mmHg  Pulse 66  Temp(Src) 97.9 F (36.6 C) (Oral)  Resp 12  Wt 205 lb 6.4 oz (93.169 kg)  SpO2 99%  LMP 11/03/2012  Not orthostatic.  Oropharynx without mucositis.  hyperpigmented speckles on tongue    Physical Findings:    Filed Vitals:   06/18/15 1032  BP: 147/90  Pulse: 66  Temp:   Resp:     NAD.   hyperpigmented speckles on tongue appear benign and are not raised. She has no skin changes over her neck thus far.   Impression:  The patient is tolerating radiotherapy.  Plan:  Continue radiotherapy as planned.   Patient will be referred to nutritionist if signficant wt loss continues by followup in 2-3 wks  Refer to neuro rehab with [facility] for dysphagia.  She will contact her dentist re: tooth pain and  tongue spots  __________   Eppie Gibson, M.D.

## 2015-06-18 NOTE — Telephone Encounter (Signed)
Called patient to inform of [facility] on 06-21-15- arrival time - 12:45 pm @ [facility] Radiology and her Neuro-Rehab appt. On 07-02-15- arrival time - 2:30 pm with Garald Balding, spoke with patient and she is aware of these appts.

## 2015-06-19 ENCOUNTER — Ambulatory Visit: Payer: 59

## 2015-06-19 ENCOUNTER — Ambulatory Visit
Admission: RE | Admit: 2015-06-19 | Discharge: 2015-06-19 | Disposition: A | Discharge status: Home / Self Care | Payer: 59 | Source: Ambulatory Visit | Attending: Radiation Oncology | Admitting: Radiation Oncology

## 2015-06-19 ENCOUNTER — Encounter: Payer: Self-pay | Admitting: Radiation Oncology

## 2015-06-19 ENCOUNTER — Encounter: Payer: Self-pay | Admitting: *Deleted

## 2015-06-19 DIAGNOSIS — D447 Neoplasm of uncertain behavior of aortic body and other paraganglia: Secondary | ICD-10-CM | POA: Diagnosis not present

## 2015-06-19 NOTE — Progress Notes (Signed)
Oncology Nurse Navigator Documentation  Oncology Nurse Navigator Flowsheets 06/19/2015  Navigator Encounter Type Clinic/MDC  Patient Visit Type Radonc  Treatment Phase Final Radiation Tx  Met with pt during final RT to offer support and to celebrate end of radiation treatment.  She was accompanied by her niece. 1. I explained that my role as navigator will continue for several more months and that I will be calling and/or joining her during follow-up visits.   2. I encouraged her to call me with needs/concerns.   3. She verbalized understanding.   Interventions -  Education Method -  Support Groups/Services -  Time Spent with Patient [place], RN, BSN, [place] at [place] [phone]

## 2015-06-20 ENCOUNTER — Ambulatory Visit: Payer: 59

## 2015-06-21 ENCOUNTER — Ambulatory Visit (HOSPITAL_COMMUNITY)
Admission: RE | Admit: 2015-06-21 | Discharge: 2015-06-21 | Disposition: A | Discharge status: Home / Self Care | Payer: 59 | Source: Ambulatory Visit | Attending: Internal Medicine | Admitting: Internal Medicine

## 2015-06-21 ENCOUNTER — Ambulatory Visit (HOSPITAL_COMMUNITY)
Admission: RE | Admit: 2015-06-21 | Discharge: 2015-06-21 | Disposition: A | Discharge status: Home / Self Care | Payer: 59 | Source: Ambulatory Visit | Attending: Radiation Oncology | Admitting: Radiation Oncology

## 2015-06-21 DIAGNOSIS — D446 Neoplasm of uncertain behavior of carotid body: Secondary | ICD-10-CM

## 2015-06-21 DIAGNOSIS — D447 Neoplasm of uncertain behavior of aortic body and other paraganglia: Secondary | ICD-10-CM

## 2015-06-21 DIAGNOSIS — I10 Essential (primary) hypertension: Secondary | ICD-10-CM | POA: Insufficient documentation

## 2015-06-21 DIAGNOSIS — Z1231 Encounter for screening mammogram for malignant neoplasm of breast: Secondary | ICD-10-CM

## 2015-06-21 DIAGNOSIS — R1310 Dysphagia, unspecified: Secondary | ICD-10-CM | POA: Diagnosis not present

## 2015-06-22 ENCOUNTER — Other Ambulatory Visit: Payer: Self-pay | Admitting: Radiation Oncology

## 2015-06-22 ENCOUNTER — Telehealth: Payer: Self-pay | Admitting: *Deleted

## 2015-06-22 NOTE — Telephone Encounter (Signed)
Oncology Nurse Navigator Documentation  Oncology Nurse Navigator Flowsheets 06/22/2015  Navigator Encounter Type Telephone  Patient returned my earlier VM.  She confirmed information provided.  I explained that Dr. Isidore Moos is sending picture of her tongue to Dr. Macky Lower, [facility], via email for his opinion.  She verbalized understanding and that I would follow-up with her next week.  Patient Visit Type -  Treatment Phase -  Interventions -  Education Method -  Support Groups/Services -  Time Spent with Patient [place], RN, BSN, [place] at [place] [phone]

## 2015-06-22 NOTE — Telephone Encounter (Signed)
Oncology Nurse Navigator Documentation  Oncology Nurse Navigator Flowsheets 06/22/2015  Navigator Encounter Type Telephone  Returned pt's VM, LVM with answers to questions, asked her to return my call to confirm receipt and understanding.  Patient Visit Type -  Treatment Phase -  Interventions -  Education Method -  Support Groups/Services -  Time Spent with Patient [place], RN, BSN, [place] at [place] [phone]

## 2015-06-24 NOTE — Progress Notes (Signed)
  Radiation Oncology         [phone] ________________________________  Name: Tasha [name] MRN: [id number]  Date: 06/19/2015  DOB: 1956-10-31  End of Treatment Note  Diagnosis:   Right carotid body paraganglioma   Indication for treatment:  curative       Radiation treatment dates:   05/14/2015-06/19/2015  Site/dose:   Right Carotid Body Paraganglioma  Beams/energy:   Helical ORVI/1BP   Narrative: The patient tolerated radiation treatment relatively well.      Plan: The patient has completed radiation treatment. The patient will return to radiation oncology clinic for routine followup in one half month. I advised them to call or return sooner if they have any questions or concerns related to their recovery or treatment.  -----------------------------------  Eppie Gibson, MD

## 2015-07-05 NOTE — Progress Notes (Signed)
Pain Status:   Nutritional Status 1) Using a feeding tube?: NO 2) Summary of daily intake: Soft diet - ~70%  3) Weight changes, if any: may need to be referred to nutritionist if weight continues to decline  Swallowing Status: Better with soreness of her esophagus   Smoking or chewing tobacco? Quit smoking six years ago  Dental (if applicable):  Next dental appointment is on .  Using fluoride trays daily?    Summary of last ENT visit is .  Last visit prior to treatment  Next ENT visit is on: No scheduled visit at this time  Summary of last medical oncology visit (if applicable) is .   Next med/onc visit is on .  Imaging done in the last month (if applicable) revealed: None related to treatment area in past month  Other notable issues, if any:  Food lacks taste  Itching of skin since Tramadol which is new presentation - plans to stop  BP 123/70 mmHg  Pulse 70  Temp(Src) 97.8 F (36.6 C)  Ht 5\' 4"  (1.626 m)  Wt 203 lb (92.08 kg)  BMI 34.83 kg/m2  LMP 11/03/2012  Wt Readings from Last 3 Encounters:  07/06/15 203 lb (92.08 kg)  06/18/15 205 lb 6.4 oz (93.169 kg)  06/11/15 213 lb 4.8 oz (96.752 kg)

## 2015-07-06 ENCOUNTER — Ambulatory Visit
Admission: RE | Admit: 2015-07-06 | Discharge: 2015-07-06 | Disposition: A | Discharge status: Home / Self Care | Payer: 59 | Source: Ambulatory Visit | Attending: Radiation Oncology | Admitting: Radiation Oncology

## 2015-07-06 ENCOUNTER — Encounter: Payer: Self-pay | Admitting: Radiation Oncology

## 2015-07-06 VITALS — BP 123/70 | HR 70 | Temp 97.8°F | Ht 64.0 in | Wt 203.0 lb

## 2015-07-06 DIAGNOSIS — D446 Neoplasm of uncertain behavior of carotid body: Secondary | ICD-10-CM

## 2015-07-06 NOTE — Progress Notes (Signed)
Radiation Oncology         [phone] ________________________________  Name: Tasha [name] MRN: [id number]  Date: 07/06/2015  DOB: [birth year]  Follow-Up Visit Note  outpatient  CC: No primary care provider on file.  Philomena Doheny, MD  Diagnosis and Prior Radiotherapy:    ICD-9-CM ICD-10-CM   1. 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 The soreness of her esophagus has improved. Her weight is down approximately 2 lbs since completing treatment. Pt states that her swallowing has improved. The pt reports that her throat is tender when she swallows. She also complains of some persistent neck pain. Pt states she still has no sense of taste. She plans to cancel SLP consult.  MBSS was WNL.  ALLERGIES:  is allergic to antihistamines, chlorpheniramine-type and sulfa antibiotics.  Meds: Current Outpatient Prescriptions  Medication Sig Dispense Refill  . diazepam (VALIUM) 5 MG tablet Take 1 tablet (5 mg total) by mouth every 8 (eight) hours as needed for anxiety. 5 tablet 0  . emollient (BIAFINE) cream Apply topically as needed.    . hydrochlorothiazide (HYDRODIURIL) 25 MG tablet Take 25 mg by mouth daily.   0  . ibuprofen (ADVIL,MOTRIN) 800 MG tablet Take 800 mg by mouth every 8 (eight) hours as needed.    . senna (SENOKOT) 8.6 MG TABS tablet Take 2 tablets (17.2 mg total) by mouth daily. 60 each 0  . traMADol (ULTRAM) 50 MG tablet Take 50 mg by mouth every 6 (six) hours as needed.     No current facility-administered medications for this encounter.    Physical Findings: The patient is in no acute distress. Patient is alert and oriented.  height is 5\' 4"  (1.626 m) and weight is 203 lb (92.08 kg). Her temperature is 97.8 F (36.6 C). Her blood pressure is 123/70 and her pulse is 70.  No oropharyngeal lesions except for black spots which appear to be slightly fading. Mild fullness in the  level 2/3 region of the right neck. Skin shows no dryness or obvious radiation changes.  Lab Findings: Lab Results  Component Value Date   WBC 6.2 04/30/2015   HGB 12.2 04/30/2015   HCT 36.5 04/30/2015   MCV 89.7 04/30/2015   PLT 190 04/30/2015    Radiographic Findings: Dg Swallowing Func-speech Pathology  06/21/2015    Objective Swallowing Evaluation:    Patient Details  Name: Tasha [name] MRN: [id number] Date of Birth: [birth year]  Today's Date: 06/21/2015 Time: SLP Start Time (ACUTE ONLY): 1307-SLP Stop Time (ACUTE ONLY): 1335 SLP Time Calculation (min) (ACUTE ONLY): 28 min  Past Medical History:  Past Medical History  Diagnosis Date  . Hypertension   . Fibroids   . DDD (degenerative disc disease)     DJD.  spine.  chronic pain.   Marland Kitchen Pyelonephritis 2004  . Vertigo 2014    positional.  Eval with Dr Jannifer Franklin, neuro, 2014.    Past Surgical History:  Past Surgical History  Procedure Laterality Date  . Neck surgery      cadaver bones put in her neck  . Cholecystectomy  2001  . Cesarean section    . Ercp w/ sphicterotomy  2001    removal CBD stones  . 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    HPI:  Other Pertinent Information: 59 yo female referred  by Dr Isidore Moos for MBS  due to pt report of difficulties swallowing pills on occasion and  odynophagia. She also states she has issues with "nasal drainage".  Pt  reports not using lidocaine or carafate as it "coats her throat".  In  addition,she does not use biotene but does use water/salt/baking soda  rinse to help manage secretions. Pt has been diagnosed with a  paraganglioma, right carotid body tumor and has been undergoing radiation  tx. 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  Pt reports she finished radiation tx on Tuesday June 28th.   She  states she takes some medications with applesauce   No Data Recorded  Assessment / Plan / Recommendation CHL IP  CLINICAL IMPRESSIONS 06/21/2015  Therapy Diagnosis WFL  Clinical Impression Normal oropharyngeal swallow noted without  aspiration/penetration of any consistency tested.  Pt with minimal oral  delay = suspect behavioral to compensate for dysphagia/odynophagia.   Premature spill with swallow trigger at pyriform sinus x1 with thin noted.   Overall swallow was strong and timely.    Pt did report sensation of barium tablet lodging in pharynx - suspect  sensitivity from tablet "scraping" pharynx due to radiation tx as pharynx  was clear.    Appearance of slow clearance of esophagus with sweep x1 - water faciliated  full clearance.  Radiologist not present.  Barium tablet taken with thin  transited quickly through pharynx/esophagus into stomach.    Provided pt with exercises in writing and demonstrating to mitigate  dysphagia including lingual press *after MD approval* and effortful  swallow.  Using live video, educated pt to findings, recommendations.   Thanks for this referral.       CHL IP TREATMENT RECOMMENDATION 06/21/2015  Treatment Recommendations F/u OP SLP     CHL IP DIET RECOMMENDATION 06/21/2015  SLP Diet Recommendations Age appropriate regular solids;Dysphagia 3 (Mech  soft);Thin  Liquid Administration via Cup, straw  Medication Administration Whole meds with liquid  Compensations Slow rate;Small sips/bites  Postural Changes and/or Swallow Maneuvers upright     CHL IP OTHER RECOMMENDATIONS 06/21/2015  Recommended Consults (None)  Oral Care Recommendations Oral care BID  Other Recommendations (None)            CHL IP REASON FOR REFERRAL 06/21/2015  Reason for Referral Objectively evaluate swallowing function     CHL IP ORAL PHASE 06/21/2015  Oral Phase Impaired      CHL IP PHARYNGEAL PHASE 06/21/2015  Pharyngeal Phase Impaired      CHL IP CERVICAL ESOPHAGEAL PHASE 06/21/2015  Cervical Esophageal Phase Impaired  Cervical Esophageal Comment Appearance of slow clearance of esophagus with  sweep x1 - water faciliated full  clearance.  Barium tablet taken with thin  transited quickly through pharynx/esophagus into stomach - although pt  reported sensation of pharyngeal residue.  ? if tablet agitated area  during transit due to recent radiation.     CHL IP GO 06/21/2015  Functional Assessment Tool Used MBS  Functional Limitations Swallowing  Swallow Current Status (X4128) CI  Swallow Goal Status (N8676) CI  Swallow Discharge Status [phone]) CI           Luanna Salk, MS [facility] SLP [phone]    Mm Digital Screening Bilateral  06/26/2015   CLINICAL DATA:  Screening.  EXAM: DIGITAL SCREENING BILATERAL MAMMOGRAM WITH CAD  COMPARISON:  Previous exam(s).  ACR Breast Density Category b: There are scattered areas of fibroglandular density.  FINDINGS: There  are no findings suspicious for malignancy. Images were processed with CAD.  IMPRESSION: No mammographic evidence of malignancy. A result letter of this screening mammogram will be mailed directly to the patient.  RECOMMENDATION: Screening mammogram in one year. (Code:[id number]-B-01Y)  BI-RADS CATEGORY  1: Negative.   Electronically Signed   By: Altamese Cabal M.D.   On: 06/26/2015 11:19    Impression/Plan: Healing well from RT  The pt believes that since her swallowing has improved on its own, she would like to cancel her appointment with speech therapy on Monday. I advised her to call the office first thing in the morning to cancel.  Order CT scan of the in the end of October with a f/u afterwards.  Patient advised to f/u with her dentist for examination of the pigmented spots on her tongue, which she first noticed during RT.  The tongue received only scatter dose of RT.    This document serves as a record of services personally performed by Eppie Gibson, MD. It was created on her behalf by Darcus Austin, a trained medical scribe. The creation of this record is based on the scribe's personal observations and the provider's statements to them. This document has been checked and approved by  the attending provider.     _____________________________________   Eppie Gibson, MD

## 2015-07-09 ENCOUNTER — Telehealth: Payer: Self-pay | Admitting: *Deleted

## 2015-07-09 ENCOUNTER — Ambulatory Visit: Payer: 59

## 2015-07-09 NOTE — Telephone Encounter (Signed)
CALLED PATIENT TO INFORM OF LAB, SCAN FOR 10-16-15 AND HER FU WITH DR. Isidore Moos TO GET HER RESULTS ON 10/19/15, SPOKE WITH PATIENT AND SHE IS AWARE OF THESE APPTS.

## 2015-07-25 ENCOUNTER — Telehealth: Payer: Self-pay

## 2015-07-25 ENCOUNTER — Ambulatory Visit: Payer: BLUE CROSS/BLUE SHIELD | Admitting: Neurology

## 2015-07-25 NOTE — Telephone Encounter (Signed)
Patient did not come to a new patient appointment today. 

## 2015-07-26 ENCOUNTER — Encounter: Payer: Self-pay | Admitting: Neurology

## 2015-08-25 ENCOUNTER — Encounter (HOSPITAL_COMMUNITY): Payer: Self-pay | Admitting: Emergency Medicine

## 2015-08-25 ENCOUNTER — Emergency Department (HOSPITAL_COMMUNITY)
Admission: EM | Admit: 2015-08-25 | Discharge: 2015-08-25 | Disposition: A | Discharge status: Home / Self Care | Payer: 59 | Attending: Emergency Medicine | Admitting: Emergency Medicine

## 2015-08-25 DIAGNOSIS — I10 Essential (primary) hypertension: Secondary | ICD-10-CM | POA: Insufficient documentation

## 2015-08-25 DIAGNOSIS — R51 Headache: Secondary | ICD-10-CM | POA: Diagnosis not present

## 2015-08-25 DIAGNOSIS — Z86018 Personal history of other benign neoplasm: Secondary | ICD-10-CM | POA: Diagnosis not present

## 2015-08-25 DIAGNOSIS — R42 Dizziness and giddiness: Secondary | ICD-10-CM | POA: Insufficient documentation

## 2015-08-25 DIAGNOSIS — Z79899 Other long term (current) drug therapy: Secondary | ICD-10-CM | POA: Insufficient documentation

## 2015-08-25 DIAGNOSIS — R519 Headache, unspecified: Secondary | ICD-10-CM

## 2015-08-25 DIAGNOSIS — Z87448 Personal history of other diseases of urinary system: Secondary | ICD-10-CM | POA: Diagnosis not present

## 2015-08-25 DIAGNOSIS — Z87891 Personal history of nicotine dependence: Secondary | ICD-10-CM | POA: Insufficient documentation

## 2015-08-25 MED ORDER — ONDANSETRON 4 MG PO TBDP
4.0000 mg | ORAL_TABLET | Freq: Once | ORAL | Status: AC
  Administered 2015-08-25: 4 mg via ORAL
  Filled 2015-08-25: qty 1

## 2015-08-25 MED ORDER — MECLIZINE HCL 25 MG PO TABS: 25.0000 mg | ORAL_TABLET | Freq: Three times a day (TID) | ORAL | Status: DC | PRN

## 2015-08-25 MED ORDER — MECLIZINE HCL 25 MG PO TABS
25.0000 mg | ORAL_TABLET | Freq: Once | ORAL | Status: AC
  Administered 2015-08-25: 25 mg via ORAL
  Filled 2015-08-25: qty 1

## 2015-08-25 MED ORDER — KETOROLAC TROMETHAMINE 60 MG/2ML IM SOLN
60.0000 mg | Freq: Once | INTRAMUSCULAR | Status: AC
  Administered 2015-08-25: 60 mg via INTRAMUSCULAR
  Filled 2015-08-25: qty 2

## 2015-08-25 NOTE — ED Notes (Signed)
Pt does not want to placed in a gown and does not want the BP cuff left on. States its too tight.

## 2015-08-25 NOTE — Discharge Instructions (Signed)
Take the prescribed medication as directed.  May take tylenol or motrin as needed for headache. Follow-up with your neurologist. Return to the ED for new or worsening symptoms.

## 2015-08-25 NOTE — ED Provider Notes (Signed)
CSN: [id number]     Arrival date & time 08/25/15  7412 History   First MD Initiated Contact with Patient 08/25/15 [phone]     Chief Complaint  Patient presents with  . Headache  . Dizziness     (Consider location/radiation/quality/duration/timing/severity/associated sxs/prior Treatment) Patient is a 59 y.o. female presenting with headaches and dizziness. The history is provided by the patient and medical records.  Headache Associated symptoms: dizziness   Dizziness Associated symptoms: headaches     This is a 59 year old female with history of hypertension, chronic neck pain, vertigo, presenting to the ED for headache and dizziness which is been ongoing for the past 2 months. Patient states this morning she felt "sharp pains" along both sides of her head which are somewhat worse than normal and she began to feel dizzy like the room is spinning.  No syncope or loss of consciousness.  Patient denies any focal numbness, weakness, confusion, visual disturbance, changes in speech, or difficulty ambulating. Patient has not taken any medications for her headache prior to arrival. She is not currently on any type of anticoagulation. No fever, chills, or neck stiffness. Patient was evaluated by neurology (Dr. Jannifer Franklin) a few years ago, has not followed up as a issues with her insurance.  CT scan done in May 2016 for similar symptoms which was negative for acute findings.  VSS.  Past Medical History  Diagnosis Date  . Hypertension   . Fibroids   . DDD (degenerative disc disease)     DJD.  spine.  chronic pain.   Marland Kitchen Pyelonephritis 2004  . Vertigo 2014    positional.  Eval with Dr Jannifer Franklin, neuro, 2014.    Past Surgical History  Procedure Laterality Date  . Neck surgery      cadaver bones put in her neck  . Cholecystectomy  2001  . Cesarean section    . Ercp w/ sphicterotomy  2001    removal CBD stones  . Skin biopsy  12/2009, 06/6719    9470 chest: lichenoid dermatitis. 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    Family History  Problem Relation Age of Onset  . Hypertension Mother   . Cancer Mother   . Hyperlipidemia Mother   . Heart disease Father   . Cancer Father   . Heart attack Father    Social History  Substance Use Topics  . Smoking status: Former Smoker    Quit date: 12/22/2008  . Smokeless tobacco: Never Used  . 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  All other systems reviewed and are negative.     Allergies  Antihistamines, chlorpheniramine-type and Sulfa antibiotics  Home Medications   Prior to Admission medications   Medication Sig Start Date End Date Taking? Authorizing Provider  diazepam (VALIUM) 5 MG tablet Take 1 tablet (5 mg total) by mouth every 8 (eight) hours as needed for anxiety. 12/05/14   Montine Circle, PA-C  emollient (BIAFINE) cream Apply topically as needed.    Historical Provider, MD  hydrochlorothiazide (HYDRODIURIL) 25 MG tablet Take 25 mg by mouth daily.  09/07/14   Historical Provider, MD  ibuprofen (ADVIL,MOTRIN) 800 MG tablet Take 800 mg by mouth every 8 (eight) hours as needed.  Historical Provider, MD  senna (SENOKOT) 8.6 MG TABS tablet Take 2 tablets (17.2 mg total) by mouth daily. 07/27/14   Modena Jansky, MD  traMADol (ULTRAM) 50 MG tablet Take 50 mg by mouth every 6 (six) hours as needed.    Historical Provider, MD   BP 124/43 mmHg  Pulse 53  Temp(Src) 98 F (36.7 C) (Oral)  Resp 16  SpO2 99%  LMP 11/03/2012   Physical Exam  Constitutional: She is oriented to person, place, and time. She appears well-developed and well-nourished.  Drinking water, eating crackers  HENT:  Head: Normocephalic and atraumatic.  Mouth/Throat: Oropharynx is clear and moist.  Eyes: Conjunctivae and EOM are normal.  Pupils are equal, round, and reactive to light.  Neck: Normal range of motion and full passive range of motion without pain. No rigidity.  No rigidity, no meningismus  Cardiovascular: Normal rate, regular rhythm and normal heart sounds.   Pulmonary/Chest: Effort normal and breath sounds normal.  Abdominal: Soft. Bowel sounds are normal. There is no tenderness. There is no guarding.  Musculoskeletal: Normal range of motion. She exhibits no edema.  Neurological: She is alert and oriented to person, place, and time.  AAOx3, answering questions appropriately; equal strength UE and LE bilaterally; CN grossly intact; moves all extremities appropriately without ataxia; no focal neuro deficits or facial asymmetry appreciated  Skin: Skin is warm and dry.  Psychiatric: She has a normal mood and affect.  Nursing note and vitals reviewed.   ED Course  Procedures (including critical care time) Labs Review Labs Reviewed - No data to display  Imaging Review No results found.    EKG Interpretation None      MDM   Final diagnoses:  Headache, unspecified headache type   59 y.o. F here with headache and dizziness for the past 2 months.  Patient is afebrile, nontoxic. Her neurologic exam is nonfocal. She is eating crackers and drinking water without difficulty. Vital signs stable. Patient was treated with Toradol, meclizine, and Zofran with resolution of symptoms. Neurologic exam remains nonfocal. VSS.  Do not feel the patient needs further workup in the emergency department today. She appears stable for discharge. Recommended once again to follow-up with neurology. Rx meclizine, instructed to use tylenol/motrin for headache at home. Discussed plan with patient, he/she acknowledged understanding and agreed with plan of care.  Return precautions given for new or worsening symptoms.  Larene Pickett, PA-C 08/25/15 [place], MD 08/25/15 2300

## 2015-08-25 NOTE — ED Notes (Signed)
Pt acknowledges being sent home with all of her belongings.

## 2015-08-25 NOTE — ED Notes (Addendum)
Pt states she has had headaches and dizziness since July. Pt stated pain was worse early this morning with some nausea.  Pt is pain free and dizzy free at this time.

## 2015-10-03 ENCOUNTER — Telehealth: Payer: Self-pay | Admitting: *Deleted

## 2015-10-03 NOTE — Telephone Encounter (Signed)
  Oncology Nurse Navigator Documentation   Navigator Encounter Type: Telephone (10/03/15 1729)   Treatment Phase: Other (10/03/15 1729)       Returned patient's VMM in which she expressed concern re R neck swelling, inquired about f/u appt with Dr. Isidore Moos. She noted swelling appeared about 2 days ago, denies pain, fever.  Wants Dr. Isidore Moos to be aware. I confirmed her understanding of appts:  10/25:  1500 Labs at [facility]; 1600 CT neck at [facility], she understands NPO after 1200 noon.  10/28:  1540 appt with Dr. Isidore Moos. She understands I can be contacted with needs/concern.  [name], RN, BSN, [place] at [place] [phone]               Time Spent with Patient: 15 (10/03/15 1729)

## 2015-10-09 ENCOUNTER — Encounter: Payer: Self-pay | Admitting: *Deleted

## 2015-10-09 NOTE — Progress Notes (Signed)
Addendum error ?

## 2015-10-09 NOTE — Progress Notes (Addendum)
  Oncology Nurse Navigator Documentation   Navigator Encounter Type: Other (10/09/15 1445)     Met Jazmin in [facility] lobby at her request.  She was here thinking her appts were this week. She expressed:  Concern that slight, painless swelling in R neck indicates previously treated tumor is growing, may be cancerous.  "I don't want to know what CT says", stating she can "handle it better" without knowing.  She tearfully recalled that her mother did not tell her family she had cancer until 2 weeks prior to her passing, noted she was very angry that she wasn't informed.  She indicated if she doesn't know about results, she can honestly tell her dtr she doesn't know.  She later stated she is going to ask her dtr if she wants to know about scan results. She accepted my offer of support from LCSW, I later notified Lauren Mullis.  [name], RN, BSN, [place] at [place] [phone]                     Time Spent with Patient: 30 (10/09/15 1445)

## 2015-10-16 ENCOUNTER — Ambulatory Visit (HOSPITAL_BASED_OUTPATIENT_CLINIC_OR_DEPARTMENT_OTHER)
Admission: RE | Admit: 2015-10-16 | Discharge: 2015-10-16 | Disposition: A | Discharge status: Home / Self Care | Payer: 59 | Source: Ambulatory Visit | Attending: Radiation Oncology | Admitting: Radiation Oncology

## 2015-10-16 ENCOUNTER — Ambulatory Visit (HOSPITAL_COMMUNITY): Payer: 59

## 2015-10-16 ENCOUNTER — Ambulatory Visit (HOSPITAL_COMMUNITY)
Admission: RE | Admit: 2015-10-16 | Discharge: 2015-10-16 | Disposition: A | Discharge status: Home / Self Care | Payer: 59 | Source: Ambulatory Visit | Attending: Radiation Oncology | Admitting: Radiation Oncology

## 2015-10-16 ENCOUNTER — Encounter (HOSPITAL_COMMUNITY): Payer: Self-pay

## 2015-10-16 DIAGNOSIS — D446 Neoplasm of uncertain behavior of carotid body: Secondary | ICD-10-CM | POA: Insufficient documentation

## 2015-10-16 DIAGNOSIS — Z09 Encounter for follow-up examination after completed treatment for conditions other than malignant neoplasm: Secondary | ICD-10-CM | POA: Insufficient documentation

## 2015-10-16 LAB — BUN AND CREATININE (CC13)
BUN: 18.5 mg/dL (ref 7.0–26.0)
Creatinine: 0.8 mg/dL (ref 0.6–1.1)

## 2015-10-16 MED ORDER — IOHEXOL 300 MG/ML  SOLN
75.0000 mL | Freq: Once | INTRAMUSCULAR | Status: AC | PRN
  Administered 2015-10-16: 75 mL via INTRAVENOUS

## 2015-10-17 NOTE — Progress Notes (Signed)
[name] is here for follow-up of radiation to a Right Carotid body paraganglioma completed 06/19/15.   Pain issues, if any: She complains of pain a 6 all over which she relates to "old age". She will occasionally take tramadol for this, but doesn't like the way it makes her feel sometimes.  Using a feeding tube?:  No Weight changes, if any:  Wt Readings from Last 3 Encounters:  10/19/15 209 lb 6.4 oz (94.983 kg)  07/06/15 203 lb (92.08 kg)  06/18/15 205 lb 6.4 oz (93.169 kg)   Swallowing issues, if any: She reports some difficulty swallowing at times especially with larger bites. She needs to chew her food correctly for it to go down without gagging.  Smoking or chewing tobacco? No Using fluoride trays daily? No Last ENT visit was on: She reports she last saw her ENT at her initial diagnosis, and she has no appointment scheduled in the future.  Other notable issues, if any: The skin to her Right neck is normal in appearance. She reports no other issues at this time.   BP 120/68 mmHg  Pulse 71  Temp(Src) 97.6 F (36.4 C)  Ht 5\' 4"  (1.626 m)  Wt 209 lb 6.4 oz (94.983 kg)  BMI 35.93 kg/m2  LMP 11/03/2012

## 2015-10-19 ENCOUNTER — Encounter: Payer: Self-pay | Admitting: *Deleted

## 2015-10-19 ENCOUNTER — Ambulatory Visit
Admission: RE | Admit: 2015-10-19 | Discharge: 2015-10-19 | Disposition: A | Discharge status: Home / Self Care | Payer: 59 | Source: Ambulatory Visit | Attending: Radiation Oncology | Admitting: Radiation Oncology

## 2015-10-19 ENCOUNTER — Encounter: Payer: Self-pay | Admitting: Radiation Oncology

## 2015-10-19 VITALS — BP 120/68 | HR 71 | Temp 97.6°F | Ht 64.0 in | Wt 209.4 lb

## 2015-10-19 DIAGNOSIS — D446 Neoplasm of uncertain behavior of carotid body: Secondary | ICD-10-CM

## 2015-10-19 NOTE — Progress Notes (Signed)
  Oncology Nurse Navigator Documentation   Navigator Encounter Type: Clinic/MDC (10/19/15 1605) Patient Visit Type: Radonc;Follow-up (10/19/15 1605)     To provide support and encouragement, care continuity and to assess for needs, met with patient during f/u appt with Dr. Isidore Moos.  She is 4 months s/p last RT. She verbalized understanding of positive CT scan results, Dr. Pearlie Oyster explanation of the nature of paragangliomas, her long term positive prognosis s/p tmt. She accepted Dr. Pearlie Oyster suggested practice for dealing with her anxiety. She understands she can contact me as needs/concerns arise.  Tasha Orem, RN, BSN, [place] at [place] [phone]                    Time Spent with Patient: 30 (10/19/15 1605)

## 2015-10-19 NOTE — Patient Instructions (Signed)
Chose one time per week to think about all the things that you are anxious about.  You may worry on Saturdays at 3:00pm for 15 to 20 minutes.  If a worry pops up at another time, feel free to write it down, so you can process it on Saturday at 3pm.  But your only time to spend on worrying is Saturday at 3:00pm.

## 2015-10-19 NOTE — Progress Notes (Signed)
Radiation Oncology         [phone] ________________________________  Name: Tasha [name] MRN: [id number]  Date: 10/19/2015  DOB: 06-Sep-1956  Follow-Up Visit Note  Outpatient  CC: No PCP Per Patient  Philomena Doheny, MD  Diagnosis and Prior Radiotherapy:    ICD-9-CM ICD-10-CM   1. 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 The patient had a CT of the neck with contrast on 10/16/15.  Pain issues, if any: She complains of pain as a 6/10 all over which she relates to "old age". She would occasionally take tramadol for this, but doesn't like the way it makes her feel sometimes. 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 This was seen on MRI of the lumbar spine on 07/12/14. Degenerative changes were seen in the lumbar spine as well. Using a feeding tube?: No  Weight changes, if any:  Wt Readings from Last 3 Encounters:   10/19/15  209 lb 6.4 oz (94.983 kg)   07/06/15  203 lb (92.08 kg)   06/18/15  205 lb 6.4 oz (93.169 kg)    Swallowing issues, if any: She reports some difficulty swallowing at times especially with larger bites. Smoking or chewing tobacco? No  Using fluoride trays daily? No  Last ENT visit was on: She reports she last saw her ENT at her initial diagnosis, and she has no appointment scheduled in the future.  Other notable issues, if any: The skin of her right neck is normal in appearance. She reports no other issues at this time.  [name], RN, our Head and Neck Oncology Navigator, was present during this encounter.  ALLERGIES:  is allergic to antihistamines, chlorpheniramine-type and sulfa antibiotics.  Meds: Current Outpatient Prescriptions  Medication Sig Dispense Refill  . diazepam (VALIUM) 5 MG tablet Take 1  tablet (5 mg total) by mouth every 8 (eight) hours as needed for anxiety. 5 tablet 0  . furosemide (LASIX) 20 MG tablet Take 20 mg by mouth daily. She was told to take them only when she notices swelling.    . hydrochlorothiazide (HYDRODIURIL) 25 MG tablet Take 25 mg by mouth daily.   0  . ibuprofen (ADVIL,MOTRIN) 800 MG tablet Take 800 mg by mouth every 8 (eight) hours as needed for moderate pain.     . traMADol (ULTRAM) 50 MG tablet Take 50 mg by mouth every 6 (six) hours as needed for moderate pain.     . meclizine (ANTIVERT) 25 MG tablet Take 1 tablet (25 mg total) by mouth 3 (three) times daily as needed. (Patient not taking: Reported on 10/19/2015) 15 tablet 0   No current facility-administered medications for this encounter.    Physical Findings: The patient is in no acute distress. Patient is alert and oriented.  height is 5\' 4"  (1.626 m) and weight is 209 lb 6.4 oz (94.983 kg). Her temperature is 97.6 F (36.4 C). Her blood pressure is 120/68 and her pulse is 71.  Some subtle fullness in the levels 2 to 3 regions of the right neck. Oropharynx is clear. Black speckled spots on her tongue and buccal mucosa have faded significantly.  Lab Findings: Lab Results  Component Value Date   WBC 6.2 04/30/2015   HGB 12.2 04/30/2015  HCT 36.5 04/30/2015   MCV 89.7 04/30/2015   PLT 190 04/30/2015    Radiographic Findings: Ct Soft Tissue Neck W Contrast  10/16/2015  CLINICAL DATA:  Right carotid body paraganglioma status post radiation therapy. Right neck swelling. Sore throat. EXAM: CT NECK WITH CONTRAST TECHNIQUE: Multidetector CT imaging of the neck was performed using the standard protocol following the bolus administration of intravenous contrast. CONTRAST:  8mL OMNIPAQUE IOHEXOL 300 MG/ML  SOLN COMPARISON:  Neck CTA 12/05/2014 FINDINGS: Pharynx and larynx: The nasopharynx is unremarkable. Very mild mass effect on the right lateral oropharynx due to the carotid space mass. No primary  oropharyngeal lesion identified. Larynx is unremarkable. Salivary glands: Submandibular and parotid glands are unremarkable. Thyroid: Unremarkable. Lymph nodes: No enlarged lymph nodes identified in the neck. Vascular: Avidly enhancing mass with areas of necrosis in the right carotid space centered between the internal and external carotid arteries at the bifurcation measures 3.2 x 2.2 x 5.0 cm (previously 3.4 x 2.1 x 5.1 cm). Occlusion of the proximal left ICA is again seen. Limited intracranial: The visualized portion of the brain is unremarkable. Visualized orbits: Unremarkable. Mastoids and visualized paranasal sinuses: Clear. Skeleton: Solid C4-C7 ACDF. Upper chest: Mild emphysema in the lung apices. IMPRESSION: 1. Unchanged right carotid space mass consistent with carotid body paraganglioma. 2. No new abnormality identified in the neck. Electronically Signed   By: Logan Bores M.D.   On: 10/16/2015 18:35    Impression/Plan:  Stable mass in neck following RT for paraganglioma.  Satisfactory treatment response.  Significant anxiety related to paraganglioma diagnosis, future, and other diagnoses.  I advised her to continue to follow all of her doctors' advice  As a coping mechanism for stress, I advised the pt to set aside one time per week to think about things she is anxious about and only think about them then. She is to have a CT scan of the neck in 6 months and f/u with me afterwards.   This document serves as a record of services personally performed by Eppie Gibson, MD. It was created on her behalf by Darcus Austin, a trained medical scribe. The creation of this record is based on the scribe's personal observations and the provider's statements to them. This document has been checked and approved by the attending provider.     _____________________________________   Eppie Gibson, MD

## 2015-10-22 ENCOUNTER — Telehealth: Payer: Self-pay | Admitting: *Deleted

## 2015-10-22 NOTE — Telephone Encounter (Signed)
CALLED PATIENT TO INFORM OF SCAN AND FU, LVM FOR A RETURN CALL 

## 2015-10-29 ENCOUNTER — Telehealth: Payer: Self-pay | Admitting: *Deleted

## 2015-10-29 NOTE — Telephone Encounter (Signed)
CALLED PATIENT TO ALTER HER TIMES FOR HER CT ON 04-10-16 - TEST SCHEDULED FOR 3 PM ON 04-10-16 AND HER FU WITH DR. Isidore Moos ON 04-11-16 @ 2:40 PM, PATIENT AWARE OF THESE DATES AND TIMES AND WAS AGREEABLE

## 2015-12-19 ENCOUNTER — Telehealth: Payer: Self-pay | Admitting: *Deleted

## 2015-12-19 NOTE — Telephone Encounter (Signed)
  Oncology Nurse Navigator Documentation   Navigator Encounter Type: Telephone;6 month (12/19/15 1604) Patient Visit Type: Follow-up (12/19/15 1604)     Placed 55-month post-tmt follow-up call to check on patient's well-being.  She reported that except for dry mouth and lips, she is doing well, eating/drinking whatever she wants, energy level back to baseline.  We discussed that dry mouth will be a somewhat permanent SE of radiation tmt, that she should drink water often to alleviate, use lip balm for lips. She verbalized understanding of 04/10/16 CT and 04/11/16 f/u appt with Dr. Isidore Moos. She understands she can contact me with needs/concerns.  [name], RN, BSN, [facility] [phone]                   Time Spent with Patient: 15 (12/19/15 1604)

## 2015-12-23 HISTORY — PX: FOOT SURGERY: SHX648

## 2016-03-31 ENCOUNTER — Other Ambulatory Visit: Payer: Self-pay | Admitting: Radiation Oncology

## 2016-03-31 DIAGNOSIS — D446 Neoplasm of uncertain behavior of carotid body: Secondary | ICD-10-CM

## 2016-04-01 ENCOUNTER — Telehealth: Payer: Self-pay | Admitting: *Deleted

## 2016-04-01 NOTE — Telephone Encounter (Signed)
CALLED PATIENT TO INFORM OF LAB APPT. ON 04-10-16, LVM FOR A RETURN CALL

## 2016-04-09 ENCOUNTER — Ambulatory Visit
Admission: RE | Admit: 2016-04-09 | Discharge: 2016-04-09 | Disposition: A | Discharge status: Home / Self Care | Payer: BLUE CROSS/BLUE SHIELD | Source: Ambulatory Visit | Attending: Radiation Oncology | Admitting: Radiation Oncology

## 2016-04-09 DIAGNOSIS — D446 Neoplasm of uncertain behavior of carotid body: Secondary | ICD-10-CM

## 2016-04-09 LAB — BUN AND CREATININE (CC13)
BUN: 19.3 mg/dL (ref 7.0–26.0)
Creatinine: 0.9 mg/dL (ref 0.6–1.1)
EGFR: 83 mL/min/{1.73_m2} — ABNORMAL LOW (ref >90)

## 2016-04-09 NOTE — Progress Notes (Signed)
[name] presents for follow up of radiation completed 06/19/2015 to her Right Carotid Body Paraganglioma.   Pain issues, if any: The only pain she has is in her Right Foot which she rates a 8/10. She states it is related to her Achilles Using a feeding tube?: No Weight changes, if any:  Wt Readings from Last 3 Encounters:  04/11/16 209 lb 6.4 oz (94.983 kg)  10/19/15 209 lb 6.4 oz (94.983 kg)  07/06/15 203 lb (92.08 kg)   Swallowing issues, if any: She denies problems swallowing. She does report a dry mouth but relates it to her medicine.  Smoking or chewing tobacco? No Using fluoride trays daily? She is using fluoride toothpaste. Last ENT visit was on: Fenton Malling MD 04/09/2015 Other notable issues, if any:  CT of Neck/Chest 04/10/16  BP 145/80 mmHg  Pulse 79  Temp(Src) 98.5 F (36.9 C)  Ht 5\' 4"  (1.626 m)  Wt 209 lb 6.4 oz (94.983 kg)  BMI 35.93 kg/m2  SpO2 100%  LMP 11/03/2012

## 2016-04-10 ENCOUNTER — Other Ambulatory Visit (HOSPITAL_COMMUNITY): Payer: 59

## 2016-04-10 ENCOUNTER — Ambulatory Visit: Payer: 59

## 2016-04-10 ENCOUNTER — Inpatient Hospital Stay: Admission: RE | Admit: 2016-04-10 | Payer: Self-pay | Source: Ambulatory Visit

## 2016-04-10 ENCOUNTER — Ambulatory Visit (HOSPITAL_COMMUNITY): Payer: 59

## 2016-04-10 ENCOUNTER — Ambulatory Visit (HOSPITAL_COMMUNITY)
Admission: RE | Admit: 2016-04-10 | Discharge: 2016-04-10 | Disposition: A | Discharge status: Home / Self Care | Payer: BLUE CROSS/BLUE SHIELD | Source: Ambulatory Visit | Attending: Radiation Oncology | Admitting: Radiation Oncology

## 2016-04-10 ENCOUNTER — Encounter (HOSPITAL_COMMUNITY): Payer: Self-pay

## 2016-04-10 DIAGNOSIS — D446 Neoplasm of uncertain behavior of carotid body: Secondary | ICD-10-CM | POA: Insufficient documentation

## 2016-04-10 DIAGNOSIS — I6522 Occlusion and stenosis of left carotid artery: Secondary | ICD-10-CM | POA: Insufficient documentation

## 2016-04-10 MED ORDER — IOPAMIDOL (ISOVUE-300) INJECTION 61%
75.0000 mL | Freq: Once | INTRAVENOUS | Status: AC | PRN
  Administered 2016-04-10: 75 mL via INTRAVENOUS

## 2016-04-11 ENCOUNTER — Ambulatory Visit
Admission: RE | Admit: 2016-04-11 | Discharge: 2016-04-11 | Disposition: A | Discharge status: Home / Self Care | Payer: 59 | Source: Ambulatory Visit | Attending: Radiation Oncology | Admitting: Radiation Oncology

## 2016-04-11 ENCOUNTER — Encounter: Payer: Self-pay | Admitting: Radiation Oncology

## 2016-04-11 VITALS — BP 145/80 | HR 79 | Temp 98.5°F | Ht 64.0 in | Wt 209.4 lb

## 2016-04-11 DIAGNOSIS — D446 Neoplasm of uncertain behavior of carotid body: Secondary | ICD-10-CM

## 2016-04-11 NOTE — Progress Notes (Signed)
  Radiation Oncology         (336) [phone] ________________________________  Name: Tasha [name] MRN: [id number]  Date: 10/19/2015  DOB: 03/04/1956  Follow-Up Visit Note  Outpatient  CC: No PCP Per Patient  Philomena Doheny, MD  Diagnosis and Prior Radiotherapy:    ICD-9-CM ICD-10-CM   1. 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  She denies problems swallowing.She feels like the neck mass is decreased.    Physical Findings: The patient is tearful and anxious. Patient is alert and oriented.  Filed Vitals:   04/11/16 1443  BP: 145/80  Pulse: 79  Temp: 98.5 F (36.9 C)   Wt Readings from Last 3 Encounters:  04/11/16 209 lb 6.4 oz (94.983 kg)  10/19/15 209 lb 6.4 oz (94.983 kg)  07/06/15 203 lb (92.08 kg)    Some subtle fullness in the levels 2 to 3 regions of the right neck. Oropharynx is clear. Skin intact over neck.  Lab Findings: Lab Results  Component Value Date   WBC 6.2 04/30/2015   HGB 12.2 04/30/2015   HCT 36.5 04/30/2015   MCV 89.7 04/30/2015   PLT 190 04/30/2015    Radiographic Findings: Ct Soft Tissue Neck W Contrast  04/10/2016  CLINICAL DATA:  Follow-up paraganglioma of the carotid body. Radiotherapy is been completed. EXAM: CT NECK WITH CONTRAST TECHNIQUE: Multidetector CT imaging of the neck was performed using the standard protocol following the bolus administration of intravenous contrast. CONTRAST:  3mL ISOVUE-300 IOPAMIDOL (ISOVUE-300) INJECTION 61% COMPARISON:  10/16/2015 FINDINGS: Pharynx and larynx: Mild submucosal low density expansion at the level of the lower pharynx attributed radiation. No mass lesion or asymmetric enhancement is seen. Salivary glands: Mild atrophy of the submandibular glands. Thyroid: Negative Lymph nodes:  None enlarged or abnormally enhancing. Vascular: Re- identified predominantly intensely enhancing mass in the right carotid space, splaying the internal and external branches of the carotid and consistent with carotid body paraganglioma. The mass appears slightly less bulky and rounded, but but dimensions are essentially stable from prior at 50 x 22 x 33 mm. No metachronous paraganglioma is seen. Chronic occlusion of the left ICA. Limited intracranial: Negative Visualized orbits: Limited visualization is negative Mastoids and visualized paranasal sinuses: Clear. No mass on the cochlear promontory. Skeleton: 3 level cervical ACDF. Bony fusion is solid. No acute osseous finding. Upper chest: Emphysematous spaces, primarily paraseptal. IMPRESSION: 1. Size stable right carotid body paraganglioma. 2. Chronic left ICA occlusion. Electronically Signed   By: Monte Fantasia M.D.   On: 04/10/2016 16:49    Impression/Plan:  Stable mass radiographically in neck following RT for paraganglioma.  Satisfactory treatment response.  Given the  chronic L ICA occlusion, I told her to discuss this with her PCP next week for risk modification re: CVAs. I will present her at tumor board to see if a vascular surgeon needs to evaluate her as well. F/u in 68yr with repeat CT of neck w/ contrast.  _____________________________________   Eppie Gibson, MD

## 2016-04-14 ENCOUNTER — Telehealth: Payer: Self-pay | Admitting: *Deleted

## 2016-04-14 NOTE — Telephone Encounter (Signed)
CALLED PATIENT TO INFORM OF LAB, CT AND FU, LVM FOR A RETURN CALL 

## 2016-06-11 ENCOUNTER — Telehealth: Payer: Self-pay | Admitting: Cardiovascular Disease

## 2016-06-11 NOTE — Telephone Encounter (Signed)
Received records from [place] for appointment with Dr Gwenlyn Found on 06/13/16.  Records given to [facility] (medical records) for Dr Kennon Holter schedule on 06/13/16. lp

## 2016-06-13 ENCOUNTER — Ambulatory Visit: Payer: BLUE CROSS/BLUE SHIELD | Admitting: Cardiovascular Disease

## 2016-06-17 ENCOUNTER — Encounter: Payer: Self-pay | Admitting: Cardiovascular Disease

## 2016-06-17 ENCOUNTER — Ambulatory Visit (INDEPENDENT_AMBULATORY_CARE_PROVIDER_SITE_OTHER): Payer: BLUE CROSS/BLUE SHIELD | Admitting: Cardiovascular Disease

## 2016-06-17 ENCOUNTER — Encounter (INDEPENDENT_AMBULATORY_CARE_PROVIDER_SITE_OTHER): Payer: BLUE CROSS/BLUE SHIELD

## 2016-06-17 VITALS — BP 114/72 | HR 62 | Ht 65.0 in | Wt 209.4 lb

## 2016-06-17 DIAGNOSIS — I739 Peripheral vascular disease, unspecified: Secondary | ICD-10-CM

## 2016-06-17 DIAGNOSIS — I779 Disorder of arteries and arterioles, unspecified: Secondary | ICD-10-CM

## 2016-06-17 DIAGNOSIS — R002 Palpitations: Secondary | ICD-10-CM | POA: Diagnosis not present

## 2016-06-17 DIAGNOSIS — Z79899 Other long term (current) drug therapy: Secondary | ICD-10-CM

## 2016-06-17 DIAGNOSIS — I10 Essential (primary) hypertension: Secondary | ICD-10-CM

## 2016-06-17 DIAGNOSIS — E785 Hyperlipidemia, unspecified: Secondary | ICD-10-CM

## 2016-06-17 DIAGNOSIS — R079 Chest pain, unspecified: Secondary | ICD-10-CM

## 2016-06-17 NOTE — Assessment & Plan Note (Signed)
The patient gets atypical chest pain occurring once or twice a week somewhat positional. I'm going to order a pharmacologic Myoview stress test to risk stratify her.

## 2016-06-17 NOTE — Assessment & Plan Note (Signed)
Patient complains of episodic palpitations along with chest pain. We will obtain a 2 week event monitor.

## 2016-06-17 NOTE — Progress Notes (Signed)
06/17/2016 Tasha [name]   02-26-1956  [id number]  Primary Physician Nanci Pina, FNP Primary Cardiologist: Lorretta Harp MD Renae Gloss  HPI:  Tasha [name] is a 60 year old mildly overweight single African-American female mother of one child referred by Josefine Class for cardiovascular evaluation because of known carotid disease and atypical chest pain. Her only risk factor is hypertension and hyperlipidemia. She has never had a heart attack or stroke. 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 She also occasionally gets chest pain weekly basis for somewhat atypical as well as palpitations.   Current Outpatient Prescriptions  Medication Sig Dispense Refill  . diazepam (VALIUM) 10 MG tablet Take 1 tablet by mouth at bedtime.  3  . furosemide (LASIX) 20 MG tablet Take 20 mg by mouth daily. She was told to take them only when she notices swelling.    . hydrochlorothiazide (HYDRODIURIL) 25 MG tablet Take 25 mg by mouth daily.   0  . ibuprofen (ADVIL,MOTRIN) 800 MG tablet Take 800 mg by mouth every 8 (eight) hours as needed for moderate pain.     . meclizine (ANTIVERT) 25 MG tablet Take 1 tablet (25 mg total) by mouth 3 (three) times daily as needed. 15 tablet 0  . traMADol (ULTRAM) 50 MG tablet Take 50 mg by mouth every 6 (six) hours as needed for moderate pain.      No current facility-administered medications for this visit.    Allergies  Allergen Reactions  . Antihistamines, Chlorpheniramine-Type Itching  . Sulfa Antibiotics Itching    Social History   Social History  . Marital Status: Single    Spouse Name: N/A  . Number of Children: 1  . Years of Education: college   Occupational History  . [employer]    Social History Main Topics  . Smoking status: Former Smoker    Quit date: 12/22/2008  . Smokeless tobacco: Never Used  . 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 Drug Use: 2.00 per week    Special: Marijuana     Comment: recovering drug addict  . Sexual Activity:    Partners: Male    Patent examiner Protection: None   Other Topics Concern  . Not on file   Social History Narrative     Review of Systems: General: negative for chills, fever, night sweats or weight changes.  Cardiovascular: negative for chest pain, dyspnea on exertion, edema, orthopnea, palpitations, paroxysmal nocturnal dyspnea or shortness of breath Dermatological: negative for rash Respiratory: negative for cough or wheezing Urologic: negative for hematuria Abdominal: negative for nausea, vomiting, diarrhea, bright red blood per rectum, melena, or hematemesis Neurologic: negative for visual changes, syncope, or dizziness All other systems reviewed and are otherwise negative except as noted above.    Blood pressure 114/72, pulse 62, height 5\' 5"  (1.651 m), weight 209 lb 6 oz (94.972 kg), last menstrual period 11/03/2012.  General appearance: alert and no distress Neck: no adenopathy, no carotid bruit, no JVD, supple, symmetrical, trachea midline and thyroid not enlarged, symmetric, no tenderness/mass/nodules Lungs: clear to auscultation bilaterally Heart: regular rate and rhythm, S1, S2 normal, no murmur, click, rub or gallop Extremities: extremities normal, atraumatic, no cyanosis or edema  EKG normal sinus rhythm at 63 without ST or T-wave changes. I personally reviewed this EKG  ASSESSMENT AND PLAN:  Palpitations Patient complains of episodic palpitations along with chest pain. We will obtain a 2 week event monitor.  Chest pain The patient gets atypical chest pain occurring once or twice a week somewhat positional. I'm going to order a pharmacologic Myoview stress test to risk stratify her.  Hyperlipidemia Last lipid profile are chart is 4/ 1/15 which shows an LDL of 144. We will recheck a lipid and liver  profile  Essential hypertension History of hypertension blood pressure is 114/72. She is only on diuretics for swelling in the past but not on antihypertensive medications.  Left-sided carotid artery disease ([place]) History of left internal carotid artery occlusion by CT angiography 12/05/14. We will check a carotid Doppler study      Lorretta Harp MD [facility], [facility] 06/17/2016 4:46 PM

## 2016-06-17 NOTE — Assessment & Plan Note (Signed)
History of left internal carotid artery occlusion by CT angiography 12/05/14. We will check a carotid Doppler study

## 2016-06-17 NOTE — Assessment & Plan Note (Signed)
Last lipid profile are chart is 4/ 1/15 which shows an LDL of 144. We will recheck a lipid and liver profile

## 2016-06-17 NOTE — Assessment & Plan Note (Signed)
History of hypertension blood pressure is 114/72. She is only on diuretics for swelling in the past but not on antihypertensive medications.

## 2016-06-17 NOTE — Patient Instructions (Addendum)
Medication Instructions:  Your physician recommends that you continue on your current medications as directed. Please refer to the Current Medication list given to you today.   Labwork: Your physician recommends that you return for lab work AT [place].   Testing/Procedures: Your physician has requested that you have a lexiscan myoview. For further information please visit [email or web address]. Please follow instruction sheet, as given.  Your physician has requested that you have a carotid duplex. This test is an ultrasound of the carotid arteries in your neck. It looks at blood flow through these arteries that supply the brain with blood. Allow one hour for this exam. There are no restrictions or special instructions.  Your physician has recommended that you wear an event monitor. Event monitors are medical devices that record the heart's electrical activity. Doctors most often [place] these monitors to diagnose arrhythmias. Arrhythmias are problems with the speed or rhythm of the heartbeat. The monitor is a small, portable device. You can wear one while you do your normal daily activities. This is usually used to diagnose what is causing palpitations/syncope (passing out).  10 DAYS    Follow-Up: Your physician recommends that you schedule a follow-up appointment in: [place] TEST RESULTS.   Any Other Special Instructions Will Be Listed Below (If Applicable).   +Pharmacologic Stress Echocardiogram A pharmacologic stress echocardiogram is a heart (cardiac) test used to check the function of your heart. This test may also be called a pharmacologic stress echocardiography. Pharmacologic means that a medicine is used to increase your heart rate and blood pressure.  This stress test will check how well your heart muscle and valves are working and determine if your heart muscle is getting enough blood. Some people exercise on a treadmill, which  naturally increases or stresses the functioning of their heart. For those people unable to exercise on a treadmill, a medicine is used. This medicine stimulates your heart and will cause your heart to beat harder and more quickly, as if you were exercising.  An echocardiogram uses sound waves (ultrasound) to produce an image of your heart. If your heart does not work normally, it may indicate coronary artery disease with poor coronary blood supply. The coronary arteries are the arteries that bring blood and oxygen to your heart. LET [facility] CARE PROVIDER KNOW ABOUT:  Any allergies you have.  All medicines you are taking, including vitamins, herbs, eye drops, creams, and over-the-counter medicines.  Previous problems you or members of your family have had with the use of anesthetics.  Any blood disorders you have.  Previous surgeries you have had.  Medical conditions you have.  Possibility of pregnancy, if this applies. RISKS AND COMPLICATIONS Generally, this is a safe procedure. However, as with any procedure, complications can occur. Possible complications can include:  You develop pain or pressure in the following areas:  Chest.  Jaw or neck.  Between your shoulder blades.  Radiating down your left arm.  Headache.  Dizziness or light-headedness.  Shortness of breath.  Increased or irregular heartbeat.  Low blood pressure.  Nausea or vomiting.  Flushing.  Redness going up the arm and slight pain during injection of medicine.  Heart attack (rare). BEFORE THE PROCEDURE  Avoid all forms of caffeine for 24 hours before your test or as directed by your health care provider. This includes coffee, tea (even decaffeinated tea), caffeinated sodas, chocolate, cocoa, and certain pain medicines.  Follow your health care provider's instructions  regarding eating and drinking before the test.  Take your medicines as directed at regular times with water unless instructed  otherwise. Exceptions may include:  If you have diabetes, ask how you are to take your insulin or pills. It is common to adjust insulin dosing the morning of the test.  If you are taking beta-blocker medicines, it is important to talk to your health care provider about these medicines well before the date of your test. Taking beta-blocker medicines may interfere with the test. In some cases, these medicines need to be changed or stopped 24 hours or more before the test.  If you wear a nitroglycerin patch, it may need to be removed prior to the test. Ask your health care provider if the patch should be removed before the test.  If you use an inhaler for any breathing condition, bring it with you to the test.  If you are an outpatient, bring a snack so you can eat right after the stress phase of the test.  Do not smoke for 4 hours prior to the test or as directed by your health care provider.  Wear comfortable shoes and clothing. Let your health care provider know if you were unable to complete or follow the preparations for your test. PROCEDURE   Multiple electrodes will be put on your chest. If needed, small areas of your chest may be shaved to get better contact with the electrodes. Once the electrodes are attached to your body, multiple wires will be attached to the electrodes, and your heart rate will be monitored.  An IV access will be started, and medicine will be given.  You will have an echocardiogram done at rest and done again at peak heart rate.  To produce an image of the heart, gel is applied to your chest, and a wand-like tool (transducer) is moved over the chest. The transducer sends the sound waves through the chest to create the moving images of your heart. AFTER THE PROCEDURE  Your heart rate and blood pressure will be monitored after the test.  You may return to your normal schedule, including diet, activities, and medicines, unless your health care provider tells you  otherwise.   This information is not intended to replace advice given to you by your health care provider. Make sure you discuss any questions you have with your health care provider.   Document Released: 02/28/2004 Document Revised: 12/13/2013 Document Reviewed: 08/15/2013 Elsevier Interactive Patient Education [employer].     If you need a refill on your cardiac medications before your next appointment, please call your pharmacy.

## 2016-06-19 ENCOUNTER — Encounter: Payer: Self-pay | Admitting: Cardiovascular Disease

## 2016-06-20 ENCOUNTER — Encounter: Payer: Self-pay | Admitting: Cardiovascular Disease

## 2016-06-25 ENCOUNTER — Other Ambulatory Visit: Payer: Self-pay | Admitting: Cardiovascular Disease

## 2016-06-25 DIAGNOSIS — I779 Disorder of arteries and arterioles, unspecified: Secondary | ICD-10-CM

## 2016-06-25 DIAGNOSIS — I739 Peripheral vascular disease, unspecified: Principal | ICD-10-CM

## 2016-06-30 ENCOUNTER — Other Ambulatory Visit: Payer: Self-pay | Admitting: Cardiovascular Disease

## 2016-06-30 ENCOUNTER — Telehealth: Payer: Self-pay | Admitting: Cardiovascular Disease

## 2016-06-30 NOTE — Telephone Encounter (Signed)
Patient walked in and stated her monitor does not work. It is not showing connectivity. Patient states the pause button is frozen on monitor. Patient placed monitor on self and phone was turned on. One monitor was depleted of battery life, thus no connectivity. She changed to the different monitor and nothing connected. Apologized that I do not have more trouble shooting ideas. Patient also states she was told to place the monitor on with the arrow facing down, when the arrow needs to be in the cephalic position - patient was shown this in the monitor brochure. Patient wishes to take monitor off.  Monitor report was pulled up in preventice and all readings appeared normal - readings collected from 06/18/16 - 06/27/16. Patient reports she has had no symptoms since wearing monitor. She states is was due to take the monitor off and send it back on 7/12. Advised to remove monitor and send back today since monitor is not working. [employer] notified.

## 2016-07-01 ENCOUNTER — Telehealth (HOSPITAL_COMMUNITY): Payer: Self-pay

## 2016-07-01 DIAGNOSIS — R079 Chest pain, unspecified: Secondary | ICD-10-CM

## 2016-07-01 DIAGNOSIS — R002 Palpitations: Secondary | ICD-10-CM | POA: Diagnosis not present

## 2016-07-01 DIAGNOSIS — I779 Disorder of arteries and arterioles, unspecified: Secondary | ICD-10-CM | POA: Diagnosis not present

## 2016-07-01 NOTE — Telephone Encounter (Signed)
Encounter complete. 

## 2016-07-01 NOTE — Addendum Note (Signed)
Addended by: Cristopher Estimable on: 07/01/2016 03:28 PM   Modules accepted: Orders

## 2016-07-03 ENCOUNTER — Ambulatory Visit (HOSPITAL_COMMUNITY)
Admission: RE | Admit: 2016-07-03 | Discharge: 2016-07-03 | Disposition: A | Discharge status: Home / Self Care | Payer: BLUE CROSS/BLUE SHIELD | Source: Ambulatory Visit | Attending: Cardiology | Admitting: Cardiology

## 2016-07-03 DIAGNOSIS — I739 Peripheral vascular disease, unspecified: Secondary | ICD-10-CM

## 2016-07-03 DIAGNOSIS — E663 Overweight: Secondary | ICD-10-CM | POA: Diagnosis not present

## 2016-07-03 DIAGNOSIS — I6523 Occlusion and stenosis of bilateral carotid arteries: Secondary | ICD-10-CM | POA: Insufficient documentation

## 2016-07-03 DIAGNOSIS — R079 Chest pain, unspecified: Secondary | ICD-10-CM | POA: Diagnosis not present

## 2016-07-03 DIAGNOSIS — Z6834 Body mass index (BMI) 34.0-34.9, adult: Secondary | ICD-10-CM | POA: Diagnosis not present

## 2016-07-03 DIAGNOSIS — Z8249 Family history of ischemic heart disease and other diseases of the circulatory system: Secondary | ICD-10-CM | POA: Diagnosis not present

## 2016-07-03 DIAGNOSIS — I779 Disorder of arteries and arterioles, unspecified: Secondary | ICD-10-CM | POA: Diagnosis not present

## 2016-07-03 DIAGNOSIS — I10 Essential (primary) hypertension: Secondary | ICD-10-CM | POA: Diagnosis not present

## 2016-07-03 DIAGNOSIS — R002 Palpitations: Secondary | ICD-10-CM | POA: Diagnosis not present

## 2016-07-03 MED ORDER — TECHNETIUM TC 99M TETROFOSMIN IV KIT
31.0000 | PACK | Freq: Once | INTRAVENOUS | Status: AC | PRN
  Administered 2016-07-03: 31 via INTRAVENOUS
  Filled 2016-07-03: qty 31

## 2016-07-03 MED ORDER — TECHNETIUM TC 99M TETROFOSMIN IV KIT
10.6000 | PACK | Freq: Once | INTRAVENOUS | Status: AC | PRN
  Administered 2016-07-03: 11 via INTRAVENOUS
  Filled 2016-07-03: qty 11

## 2016-07-03 MED ORDER — REGADENOSON 0.4 MG/5ML IV SOLN
0.4000 mg | Freq: Once | INTRAVENOUS | Status: AC
  Administered 2016-07-03: 0.4 mg via INTRAVENOUS

## 2016-07-03 MED ORDER — AMINOPHYLLINE 25 MG/ML IV SOLN
75.0000 mg | Freq: Once | INTRAVENOUS | Status: AC
  Administered 2016-07-03: 75 mg via INTRAVENOUS

## 2016-07-04 LAB — MYOCARDIAL PERFUSION IMAGING
CHL CUP NUCLEAR SDS: 1
CHL CUP RESTING HR STRESS: 55 {beats}/min
CSEPPHR: 90 {beats}/min
LV sys vol: 28 mL
LVDIAVOL: 77 mL (ref 46–106)
NUC STRESS TID: 1.06
SRS: 0
SSS: 1

## 2016-07-09 ENCOUNTER — Telehealth: Payer: Self-pay | Admitting: *Deleted

## 2016-07-09 DIAGNOSIS — I739 Peripheral vascular disease, unspecified: Principal | ICD-10-CM

## 2016-07-09 DIAGNOSIS — I779 Disorder of arteries and arterioles, unspecified: Secondary | ICD-10-CM

## 2016-07-09 NOTE — Telephone Encounter (Signed)
Results called to pt. Pt verbalized understanding. Repeat order order placed in EPIC. Patient had complaint that about 2 months ago she started feeling pain on left side of head near the temple area. The pain is sharp and lasts 10-15 seconds. She will press on the area to make the pain stop and it will. It occurs up to 2 times per week but she can go some weeks without it at all. Denies dizziness or ongoing headache. Advised patient to call 911 or go to the emergency room if the pain does not subside. Patient has appt 07/23/16 with Dr Gwenlyn Found.  Will route to Dr Gwenlyn Found for advice.

## 2016-07-09 NOTE — Telephone Encounter (Signed)
-----   Message from Lorretta Harp, MD sent at 07/06/2016  3:23 PM EDT ----- Chronically occluded LICA. Mild disease RICA. Repeat 12 months

## 2016-07-09 NOTE — Telephone Encounter (Signed)
Refer to primary care physician for evaluation of this

## 2016-07-09 NOTE — Telephone Encounter (Signed)
Patient given Dr Kennon Holter advice and she verbalized understanding.

## 2016-07-23 ENCOUNTER — Encounter: Payer: Self-pay | Admitting: Cardiovascular Disease

## 2016-07-23 ENCOUNTER — Ambulatory Visit (INDEPENDENT_AMBULATORY_CARE_PROVIDER_SITE_OTHER): Payer: BLUE CROSS/BLUE SHIELD | Admitting: Cardiovascular Disease

## 2016-07-23 DIAGNOSIS — I10 Essential (primary) hypertension: Secondary | ICD-10-CM

## 2016-07-23 DIAGNOSIS — R0789 Other chest pain: Secondary | ICD-10-CM

## 2016-07-23 DIAGNOSIS — I779 Disorder of arteries and arterioles, unspecified: Secondary | ICD-10-CM

## 2016-07-23 DIAGNOSIS — R002 Palpitations: Secondary | ICD-10-CM | POA: Diagnosis not present

## 2016-07-23 DIAGNOSIS — E785 Hyperlipidemia, unspecified: Secondary | ICD-10-CM

## 2016-07-23 DIAGNOSIS — I739 Peripheral vascular disease, unspecified: Secondary | ICD-10-CM

## 2016-07-23 NOTE — Assessment & Plan Note (Signed)
History of hyperlipidemia followed by her PCP 

## 2016-07-23 NOTE — Assessment & Plan Note (Signed)
History of hypertension blood pressure measured 104/64. She is on hydrochlorothiazide and furosemide. She complains of a dry mouth. I told her to stop her furosemide.

## 2016-07-23 NOTE — Patient Instructions (Signed)
Medication Instructions:  Your physician has recommended you make the following change in your medication:  1- STOP taking your Furosemide.   Testing/Procedures: Your physician has requested that you have a carotid duplex. This test is an ultrasound of the carotid arteries in your neck. It looks at blood flow through these arteries that supply the brain with blood. Allow one hour for this exam. There are no restrictions or special instructions. NEXT DOPPER July 2018.   Follow-Up: Your physician recommends that you schedule a follow-up appointment ON AN AS NEEDED BASIS.  If you need a refill on your cardiac medications before your next appointment, please call your pharmacy.

## 2016-07-23 NOTE — Assessment & Plan Note (Signed)
History of palpitations with recent event monitor that showed only sinus rhythm and sinus tachycardia.

## 2016-07-23 NOTE — Progress Notes (Signed)
Mrs. Tasha [name] returns today for follow-up of her outpatient test. A Myoview stress test was entirely normal. An event monitor showed only sinus rhythm/sinus tach and carotid Dopplers showed an occluded left internal carotid artery with a widely patent right. I reassured the patient that I think it's unlikely that her symptoms are cardiac in nature. I suggested that she obtain carotid Dopplers and annual basis. Her primary care physician is addressing her hyperlipidemia. I will see her back on an as-needed basis.  Lorretta Harp, M.D., [place], [facility], [place] [address] [place], [place]  [id number]  [phone] 07/23/2016 10:50 AM

## 2016-07-23 NOTE — Assessment & Plan Note (Signed)
History of carotid artery disease with recent Doppler studies performed in our office 07/03/16 revealing an occluded left internal carotid artery with a widely patent right internal carotid artery. Recommend annual carotid Doppler studies

## 2016-07-23 NOTE — Assessment & Plan Note (Signed)
History of atypical chest pain with recent Myoview performed 07/03/16 which was entirely normal.

## 2016-07-25 ENCOUNTER — Ambulatory Visit (INDEPENDENT_AMBULATORY_CARE_PROVIDER_SITE_OTHER): Payer: BLUE CROSS/BLUE SHIELD

## 2016-07-25 ENCOUNTER — Ambulatory Visit (INDEPENDENT_AMBULATORY_CARE_PROVIDER_SITE_OTHER): Payer: BLUE CROSS/BLUE SHIELD | Admitting: Podiatry

## 2016-07-25 ENCOUNTER — Encounter: Payer: Self-pay | Admitting: Podiatry

## 2016-07-25 VITALS — BP 143/115 | HR 72 | Resp 16 | Ht 65.0 in | Wt 200.0 lb

## 2016-07-25 DIAGNOSIS — M79672 Pain in left foot: Secondary | ICD-10-CM | POA: Diagnosis not present

## 2016-07-25 DIAGNOSIS — M722 Plantar fascial fibromatosis: Secondary | ICD-10-CM | POA: Diagnosis not present

## 2016-07-25 DIAGNOSIS — M79671 Pain in right foot: Secondary | ICD-10-CM

## 2016-07-25 DIAGNOSIS — M7661 Achilles tendinitis, right leg: Secondary | ICD-10-CM

## 2016-07-25 MED ORDER — PREDNISONE 10 MG PO TABS: ORAL_TABLET | ORAL | 0 refills | Status: DC

## 2016-07-25 MED ORDER — TRIAMCINOLONE ACETONIDE 10 MG/ML IJ SUSP
10.0000 mg | Freq: Once | INTRAMUSCULAR | Status: AC
  Administered 2016-07-25: 10 mg

## 2016-07-25 NOTE — Progress Notes (Signed)
Subjective:     Patient ID: Tasha [name], female   DOB: December 30, 1955, 60 y.o.   MRN: [id number]  HPI patient presents stating I'm having terrible pain in my right over left foot and it's been present for about a year and is gotten worse recently. It seems to hurt in the back but also the bottom and the side and I am having trouble being able to work   Review of Systems  All other systems reviewed and are negative.      Objective:   Physical Exam  Constitutional: She is oriented to person, place, and time.  Cardiovascular: Intact distal pulses.   Musculoskeletal: Normal range of motion.  Neurological: She is oriented to person, place, and time.  Skin: Skin is warm.  Nursing note and vitals reviewed.  neurovascular status intact muscle strength adequate range of motion within normal limits with patient found to have exquisite discomfort in the posterior medial aspect of the right heel and also moderate discomfort in the plantar aspect of the right heel and left heel. There is pain in the outside of the foot to around the peroneal group which appears to be more compensatory and patient is noted to have good digital perfusion and is well oriented 3     Assessment:     Inflammatory Achilles tendinitis right medial side with plantar fascial symptomatology also present which may be part of the entire pathology and pain in the left heel    Plan:     H&P and x-rays reviewed and today I went ahead and injected the medial aspect of the Achilles tendon right after explaining chances for rupture with 3 mg dexamethasone Kenalog 5 mg Xylocaine and applied boot to completely immobilize and rest the foot. Placed on Sterapred DS 12 day Dosepak and reappoint again in 2 weeks  X-ray report indicated large posterior spur right with spur left also noted and mild plantar spur formation

## 2016-07-25 NOTE — Progress Notes (Signed)
   Subjective:    Patient ID: Tasha [name], female    DOB: 02/17/1956, 59 y.o.   MRN: [id number]  HPI Chief Complaint  Patient presents with  . Foot Pain    Bilateral; Dorsal & Plantar; Swelling; pt tated, "Swelling has gone up into the leg; entire feet hurt"; x1 yr      Review of Systems  Cardiovascular: Positive for palpitations and leg swelling.  Musculoskeletal: Positive for arthralgias, back pain, gait problem and myalgias.  All other systems reviewed and are negative.      Objective:   Physical Exam        Assessment & Plan:

## 2016-07-28 ENCOUNTER — Ambulatory Visit: Payer: BLUE CROSS/BLUE SHIELD | Admitting: Podiatry

## 2016-08-11 ENCOUNTER — Encounter: Payer: Self-pay | Admitting: Podiatry

## 2016-08-11 ENCOUNTER — Ambulatory Visit (INDEPENDENT_AMBULATORY_CARE_PROVIDER_SITE_OTHER): Payer: BLUE CROSS/BLUE SHIELD | Admitting: Podiatry

## 2016-08-11 DIAGNOSIS — M79671 Pain in right foot: Secondary | ICD-10-CM

## 2016-08-11 DIAGNOSIS — M7661 Achilles tendinitis, right leg: Secondary | ICD-10-CM

## 2016-08-11 DIAGNOSIS — M79672 Pain in left foot: Secondary | ICD-10-CM | POA: Diagnosis not present

## 2016-08-12 ENCOUNTER — Telehealth: Payer: Self-pay | Admitting: *Deleted

## 2016-08-12 MED ORDER — NONFORMULARY OR COMPOUNDED ITEM: 3 refills | Status: DC

## 2016-08-12 NOTE — Telephone Encounter (Addendum)
Dr.Brent Amalia Hailey ordered [facility]: Achilles Tendonitis cream.  Faxed to [facility].

## 2016-08-13 NOTE — Progress Notes (Signed)
Subjective:     Patient ID: Tasha [name], female   DOB: 1956/10/02, 60 y.o.   MRN: [id number]  HPI patient states it's feeling quite a bit better but still have some occasional discomfort   Review of Systems     Objective:   Physical Exam Neurovascular status intact muscle strength adequate with discomfort posterior heel still present but improved from previous    Assessment:     Improve tendinitis    Plan:     Reviewed condition and at this time went ahead and recommended physical therapy supportive shoes and ice therapy. Patient will be seen back for [place] to recheck

## 2016-08-15 ENCOUNTER — Emergency Department (HOSPITAL_COMMUNITY)
Admission: EM | Admit: 2016-08-15 | Discharge: 2016-08-15 | Disposition: A | Discharge status: Home / Self Care | Payer: BLUE CROSS/BLUE SHIELD | Attending: Emergency Medicine | Admitting: Emergency Medicine

## 2016-08-15 ENCOUNTER — Emergency Department (HOSPITAL_COMMUNITY): Payer: BLUE CROSS/BLUE SHIELD

## 2016-08-15 ENCOUNTER — Encounter (HOSPITAL_COMMUNITY): Payer: Self-pay | Admitting: Vascular Surgery

## 2016-08-15 DIAGNOSIS — Z87891 Personal history of nicotine dependence: Secondary | ICD-10-CM | POA: Insufficient documentation

## 2016-08-15 DIAGNOSIS — W010XXA Fall on same level from slipping, tripping and stumbling without subsequent striking against object, initial encounter: Secondary | ICD-10-CM | POA: Diagnosis not present

## 2016-08-15 DIAGNOSIS — Y939 Activity, unspecified: Secondary | ICD-10-CM | POA: Diagnosis not present

## 2016-08-15 DIAGNOSIS — M25511 Pain in right shoulder: Secondary | ICD-10-CM | POA: Diagnosis present

## 2016-08-15 DIAGNOSIS — I10 Essential (primary) hypertension: Secondary | ICD-10-CM | POA: Insufficient documentation

## 2016-08-15 DIAGNOSIS — W19XXXA Unspecified fall, initial encounter: Secondary | ICD-10-CM

## 2016-08-15 DIAGNOSIS — I2510 Atherosclerotic heart disease of native coronary artery without angina pectoris: Secondary | ICD-10-CM | POA: Insufficient documentation

## 2016-08-15 DIAGNOSIS — Y990 Civilian activity done for income or pay: Secondary | ICD-10-CM | POA: Diagnosis not present

## 2016-08-15 DIAGNOSIS — Y929 Unspecified place or not applicable: Secondary | ICD-10-CM | POA: Diagnosis not present

## 2016-08-15 NOTE — ED Provider Notes (Signed)
[facility] DEPT Provider Note   CSN: [id number] Arrival date & time: 08/15/16  1935  By signing my name below, I, Dora Sims, attest that this documentation has been prepared under the direction and in the presence of Gay Filler, PA-C. Electronically Signed: Dora Sims, Scribe. 08/15/2016. 8:30 PM.  History   Chief Complaint Chief Complaint  Patient presents with  . Fall    The history is provided by the patient. No language interpreter was used.     HPI Comments: Tasha [name] is a 60 y.o. female with PMHx of HTN and HLD who presents to the Emergency Department complaining of posterior head pain and right shoulder pain s/p falling at work several hours ago. Pt states she slipped on water at work and fell backwards, extending her right arm to brace herself during the fall. She states she struck her backside and posterior head on the floor; she denies LOC.  Pt states she was laying on her abdomen PTA and experienced severe right shoulder pain so she decided to come to the ER. Pt reports pain in her anterior and posterior right shoulder radiating into her right upper arm. She endorses pain exacerbation with right arm raising as well as with palpation throughout her right shoulder. She does not use blood thinners. She states she does not use her home medications regularly. She has not tried any medications for pain PTA; she notes she experiences side effects from various OTC medications so she does not use them often. 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 Pt denies fever, rhinorrhea, vision changes, CP, SOB, headache, numbness/weakness of her extremities, facial droop, slurred speech, nausea, vomiting, hematuria, bowel/bladder incontinence, neuro deficits, warmth, redness, or any other associated symptoms. Pt works at [employer].  Past Medical History:  Diagnosis Date  . DDD (degenerative disc disease)    DJD.  spine.  chronic pain.     . Fibroids   . Hypertension   . Pyelonephritis 2004  . Vertigo 2014   positional.  Eval with Dr Jannifer Franklin, neuro, 2014.     Patient Active Problem List   Diagnosis Date Noted  . Left-sided carotid artery disease ([place]) 06/17/2016  . Essential hypertension 06/17/2016  . Hyperlipidemia 06/17/2016  . Chest pain 06/17/2016  . Palpitations 06/17/2016  . 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 Hypokalemia 07/26/2014  . Constipation 07/26/2014  . BRBPR (bright red blood per rectum) 07/26/2014  . Benign paroxysmal positional vertigo 05/12/2014  . Dizziness and giddiness 09/27/2013  . Fibroids 03/03/2013  . Postmenopausal bleeding 03/03/2013  . Pelvic pain 03/03/2013    Past Surgical History:  Procedure Laterality Date  . CESAREAN SECTION    . CHOLECYSTECTOMY  2001  . ERCP W/ SPHICTEROTOMY  2001   removal CBD stones  . NECK SURGERY     cadaver bones put in her neck  . 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     OB History    Gravida Para Term Preterm AB Living   4 1 1   3 1    SAB TAB Ectopic Multiple Live Births   2       1       Home Medications    Prior to Admission medications   Medication Sig Start Date End Date Taking? Authorizing Provider  diazepam (VALIUM) 10 MG tablet Take 1 tablet by mouth at bedtime. 06/04/16   Historical Provider, MD  furosemide (LASIX) 20 MG tablet Take 20 mg by mouth daily. She was told to take them only when she notices swelling.    Historical Provider, MD  hydrochlorothiazide (HYDRODIURIL) 25 MG tablet Take 25 mg by mouth daily.  09/07/14   Historical Provider, MD  ibuprofen (ADVIL,MOTRIN) 800 MG tablet Take 800 mg by mouth every 8 (eight) hours as needed for moderate pain.     Historical Provider, MD  meclizine (ANTIVERT) 25 MG tablet Take 1 tablet (25 mg total) by mouth 3 (three) times daily as needed. 08/25/15   Larene Pickett, PA-C  NONFORMULARY OR COMPOUNDED ITEM  [facility]:  Achilles Tendonitis Cream - Diclofenac 3%, Baclofen 2%, Bupivacaine 1%, Gabapentin 6%, Ibuprofen 3%, Pentoxifylline 3%, apply 1-2 grams to affected area 3-4 times daily. 08/12/16   Wallene Huh, DPM  predniSONE (DELTASONE) 10 MG tablet 12 day tapering dose 07/25/16   Wallene Huh, DPM  traMADol (ULTRAM) 50 MG tablet Take 50 mg by mouth every 6 (six) hours as needed for moderate pain.     Historical Provider, MD    Family History Family History  Problem Relation Age of Onset  . Hypertension Mother   . Cancer Mother   . Hyperlipidemia Mother   . Heart disease Father   . Cancer Father   . Heart attack Father     Social History Social History  Substance Use Topics  . Smoking status: Former Smoker    Quit date: 12/22/2008  . Smokeless tobacco: Never Used  . Alcohol use No     Comment: distant history of cocaine abuse, not active     Allergies   Antihistamines, chlorpheniramine-type and Sulfa antibiotics   Review of Systems Review of Systems  Constitutional: Negative for fever.  HENT: Negative for rhinorrhea.   Eyes: Negative for visual disturbance.  Respiratory: Negative for shortness of breath.   Cardiovascular: Negative for chest pain.  Gastrointestinal: Negative for nausea and vomiting.       Negative for bowel incontinence.  Genitourinary: Negative for hematuria.       Negative for bladder incontinence.  Musculoskeletal: Positive for arthralgias and myalgias (right shoulder).  Skin: Negative for color change.  Neurological: Negative for speech difficulty, weakness, numbness and headaches.       Negative for sensation loss.    Physical Exam Updated Vital Signs BP 142/77 (BP Location: Left Arm)   Pulse 81   Temp 97.8 F (36.6 C) (Oral)   Resp 18   Ht 5\' 5"  (1.651 m)   Wt 93.9 kg   LMP 11/03/2012   SpO2 100%   BMI 34.46 kg/m   Physical Exam  Constitutional: She appears well-developed and well-nourished. No distress.  HENT:  Head:  Normocephalic and atraumatic. Head is without raccoon's eyes, without Battle's sign and without contusion.  Mouth/Throat: Uvula is midline, oropharynx is clear and moist and mucous membranes are normal. No trismus in the jaw. No uvula swelling. No oropharyngeal exudate.  Eyes: Conjunctivae and EOM are normal. Pupils are equal, round, and reactive to light. Right eye exhibits no discharge. No scleral icterus.  Neck: Normal range of motion and phonation normal. Neck supple. Muscular tenderness ( right trapezius) present. No spinous process tenderness present. No tracheal deviation and normal range of motion present.  Cardiovascular: Normal rate, regular rhythm, normal heart sounds and intact distal pulses.   No murmur heard. Pulmonary/Chest: Effort normal and breath sounds normal. No stridor. No respiratory distress. She has no wheezes. She has no rales.  Abdominal: Soft. Bowel sounds are normal. She exhibits no distension. There is no tenderness. There is no rigidity, no rebound and no guarding.  Musculoskeletal: Normal range of motion.  No obvious deformity or right shoulder. TTP of right trapezius and right shoulder. Active ROM decrease secondary to pain, increased passive ROM; however, still painful. No C-, T-, L- spine TTP. No step off. Sensation in UE b/l intact. 2+ radial pulses. Grip strength equal and intact.   Neurological: She is alert. She is not disoriented. GCS eye subscore is 4. GCS verbal subscore is 5. GCS motor subscore is 6.  Mental Status:  Alert, thought content appropriate, able to give a coherent history. Speech fluent without evidence of aphasia. Able to follow 2 step commands without difficulty.  Cranial Nerves:  II:  Peripheral visual fields grossly normal, pupils equal, round, reactive to light III,IV, VI: ptosis not present, extra-ocular motions intact bilaterally  V,VII: smile symmetric, facial light touch sensation equal VIII: hearing grossly normal to voice  X: uvula  elevates symmetrically  XI: bilateral shoulder shrug symmetric and strong XII: midline tongue extension without fassiculations Motor:  Normal tone. 5/5 in lower extremities bilaterally including strong and equal dorsiflexion/plantar flexion. Grip strength in UE equal and strong. Difficult to further assess strength in RUE secondary to pain.  Sensory: light touch normal in all extremities. DTRs: biceps symmetric b/l Cerebellar: normal finger-to-nose with bilateral upper extremities Gait: normal gait and balance CV: distal pulses palpable throughout   Skin: Skin is warm and dry. She is not diaphoretic.  Psychiatric: She has a normal mood and affect. Her behavior is normal.  Nursing note and vitals reviewed.   ED Treatments / Results  Labs (all labs ordered are listed, but only abnormal results are displayed) Labs Reviewed - No data to display  EKG  EKG Interpretation None       Radiology Dg Shoulder Right  Result Date: 08/15/2016 CLINICAL DATA:  Status post fall.  Right shoulder pain. EXAM: RIGHT SHOULDER - 2+ VIEW COMPARISON:  None. FINDINGS: No fracture or dislocation of the right shoulder. Mild spurring at the inferior aspect of the right glenoid fossa. Cervical spine hardware is noted. Visualized right lung is normal. IMPRESSION: No acute fracture or dislocation of the right shoulder. Electronically Signed   By: Ulyses Jarred M.D.   On: 08/15/2016 20:49    Procedures Procedures (including critical care time)  DIAGNOSTIC STUDIES: Oxygen Saturation is 99% on RA, normal by my interpretation.    COORDINATION OF CARE: 8:30 PM Discussed treatment plan with pt at bedside and pt agreed to plan.  Medications Ordered in ED Medications - No data to display   Initial Impression / Assessment and Plan / ED Course  I have reviewed the triage vital signs and the nursing notes.  Pertinent labs & imaging results that were available during my care of the patient were reviewed by me  and considered in my medical decision making (see chart for details).  Clinical Course    Patient presents to ED with complaint of right shoulder pain following a fall at work. Patient is afebrile and non-toxic appearing in NAD. VSS. Physical exam remarkable for TTP of right trapezius and right shoulder. Decrease active ROM, I suspect secondary to pain. Improved ROM with passive motion. Sensation intact. 2+ radial pulses and capillary refill <3seconds. Grip strength equal. No focal neuro deficits. No battle sign or raccoon eyes. Based on [place] head CT rule, no imaging of head warranted at this time. X-ray  of right shoulder shows no acute abnormality. ?MSK vs. ?rotator cuff vs. ?tendonopathy. Discussed results with patient.  Placed patient in arm sling. Discussed symptomatic management to include RICE and NSAIDs for pain. Early ROM/activity as tolerated. Follow up with orthopedics if sxs persist. Return precautions given. Patient voiced understanding and is agreeable.   I personally performed the services described in this documentation, which was scribed in my presence. The recorded information has been reviewed and is accurate.   Final Clinical Impressions(s) / ED Diagnoses   Final diagnoses:  Fall, initial encounter  Right shoulder pain    New Prescriptions Discharge Medication List as of 08/15/2016  9:26 PM       Roxanna Mew, PA-C 08/16/16 [id number]    Dorie Rank, MD 08/16/16 [id number]

## 2016-08-15 NOTE — ED Triage Notes (Signed)
Pt reports to the ED following a fall. Pt reports she slipped and fell at work. She is complaining of some posterior head pain and right shoulder pain. Denies any LOC or blood thinner use. Pt A&Ox4, resp e/u, and skin warm and dry.

## 2016-08-15 NOTE — Discharge Instructions (Signed)
Read the information below.   Your x-rays did not show any acute fracture or dislocation. You are being provided an arm sling. Wear and use for the next 24-48 hours. Please ice area for 20 minute increments for the next 24-48 hours. You can take advil 400mg  every 6hrs for pain and swelling. As tolerated, begin range of motion exercises.  I have provided the contact information for orthopedics. If your symptoms persist for more than a week please call to schedule an appointment.  You may return to the Emergency Department at any time for worsening condition or any new symptoms that concern you. Return to the ED if you develop fever, a red/hot joint that you are unable to move, or numbness/weakness in your arm.

## 2016-08-15 NOTE — ED Notes (Signed)
Pt stable, ambulatory, states understanding of discharge instructions 

## 2016-08-20 DIAGNOSIS — M25511 Pain in right shoulder: Secondary | ICD-10-CM | POA: Insufficient documentation

## 2016-09-08 ENCOUNTER — Ambulatory Visit (INDEPENDENT_AMBULATORY_CARE_PROVIDER_SITE_OTHER): Payer: BLUE CROSS/BLUE SHIELD | Admitting: Podiatry

## 2016-09-08 DIAGNOSIS — M7661 Achilles tendinitis, right leg: Secondary | ICD-10-CM

## 2016-09-08 DIAGNOSIS — M79672 Pain in left foot: Secondary | ICD-10-CM | POA: Diagnosis not present

## 2016-09-08 DIAGNOSIS — B351 Tinea unguium: Secondary | ICD-10-CM

## 2016-09-08 DIAGNOSIS — M79676 Pain in unspecified toe(s): Secondary | ICD-10-CM

## 2016-09-08 DIAGNOSIS — M79671 Pain in right foot: Secondary | ICD-10-CM

## 2016-09-08 NOTE — Patient Instructions (Signed)
Achilles Tendinitis Achilles tendinitis is inflammation of the tough, cord-like band that attaches the lower muscles of your leg to your heel (Achilles tendon). It is usually caused by overusing the tendon and joint involved.  CAUSES Achilles tendinitis can happen because of:  A sudden increase in exercise or activity (such as running).  Doing the same exercises or activities (such as jumping) over and over.  Not warming up calf muscles before exercising.  Exercising in shoes that are worn out or not made for exercise.  Having arthritis or a bone growth on the back of the heel bone. This can rub against the tendon and hurt the tendon. SIGNS AND SYMPTOMS The most common symptoms are:  Pain in the back of the leg, just above the heel. The pain usually gets worse with exercise and better with rest.  Stiffness or soreness in the back of the leg, especially in the morning.  Swelling of the skin over the Achilles tendon.  Trouble standing on tiptoe. Sometimes, an Achilles tendon tears (ruptures). Symptoms of an Achilles tendon rupture can include:  Sudden, severe pain in the back of the leg.  Trouble putting weight on the foot or walking normally. DIAGNOSIS Achilles tendinitis will be diagnosed based on symptoms and a physical examination. An X-ray may be done to check if another condition is causing your symptoms. An MRI may be ordered if your health care provider suspects you may have completely torn your tendon, which is called an Achilles tendon rupture.  TREATMENT  Achilles tendinitis usually gets better over time. It can take weeks to months to heal completely. Treatment focuses on treating the symptoms and helping the injury heal. HOME CARE INSTRUCTIONS   Rest your Achilles tendon and avoid activities that cause pain.  Apply ice to the injured area:  Put ice in a plastic bag.  Place a towel between your skin and the bag.  Leave the ice on for 20 minutes, 2-3 times a  day  Try to avoid using the tendon (other than gentle range of motion) while the tendon is painful. Do not resume use until instructed by your health care provider. Then begin use gradually. Do not increase use to the point of pain. If pain does develop, decrease use and continue the above measures. Gradually increase activities that do not cause discomfort until you achieve normal use.  Do exercises to make your calf muscles stronger and more flexible. Your health care provider or physical therapist can recommend exercises for you to do.  Wrap your ankle with an elastic bandage or other wrap. This can help keep your tendon from moving too much. Your health care provider will show you how to wrap your ankle correctly.  Only take over-the-counter or prescription medicines for pain, discomfort, or fever as directed by your health care provider. SEEK MEDICAL CARE IF:   Your pain and swelling increase or pain is uncontrolled with medicines.  You develop new, unexplained symptoms or your symptoms get worse.  You are unable to move your toes or foot.  You develop warmth and swelling in your foot.  You have an unexplained temperature. MAKE SURE YOU:   Understand these instructions.  Will watch your condition.  Will get help right away if you are not doing well or get worse.   This information is not intended to replace advice given to you by your health care provider. Make sure you discuss any questions you have with your health care provider.   Document Released:   09/17/2005 Document Revised: 12/29/2014 Document Reviewed: 07/20/2013 Elsevier Interactive Patient Education 2016 [employer]  

## 2016-09-08 NOTE — Progress Notes (Signed)
Subjective:    Objective: Physical Exam General: The patient is alert and oriented x3 in no acute distress.  Dermatology: Skin is warm, dry and supple bilateral lower extremities. Negative for open lesions or macerations.  Vascular: Palpable pedal pulses bilaterally. No edema or erythema noted. Capillary refill within normal limits.  Neurological: Epicritic and protective threshold grossly intact bilaterally.   Musculoskeletal Exam: Range of motion within normal limits to all pedal and ankle joints bilateral. Muscle strength 5/5 in all groups bilateral.   Significant pain on palpation to the posterior and posterior lateral aspect of the insertion of the right Achilles tendon.  Pain on palpation to the bilateral second digit toenails  Assessment: #1 Achilles tendinitis right #2 painful thickened toenails second digits bilaterally #3 pain in bilateral feet  Problem List Items Addressed This Visit    None    Visit Diagnoses   None.       Plan of Care:  #1 Patient was evaluated. #2 Prescription for Achilles tendinitis cream was given through [facility] #3 continue cam boot as needed for rest and immobilization of the right Achilles tendon  #4 patient is to return to clinic in 4 weeks for permanent toenail removal second digits bilaterally.     Dr. Edrick Kins, [place]

## 2016-09-10 ENCOUNTER — Other Ambulatory Visit: Payer: Self-pay

## 2016-09-10 NOTE — Progress Notes (Signed)
[place] tendonitis cream ordered at previous visit on 08/12/16 written by Dr Paulla Dolly

## 2016-10-08 ENCOUNTER — Ambulatory Visit (INDEPENDENT_AMBULATORY_CARE_PROVIDER_SITE_OTHER): Payer: BLUE CROSS/BLUE SHIELD | Admitting: Podiatry

## 2016-10-08 DIAGNOSIS — M659 Synovitis and tenosynovitis, unspecified: Secondary | ICD-10-CM

## 2016-10-08 DIAGNOSIS — M7751 Other enthesopathy of right foot: Secondary | ICD-10-CM

## 2016-10-08 DIAGNOSIS — M7661 Achilles tendinitis, right leg: Secondary | ICD-10-CM | POA: Diagnosis not present

## 2016-10-08 DIAGNOSIS — L603 Nail dystrophy: Secondary | ICD-10-CM | POA: Diagnosis not present

## 2016-10-08 DIAGNOSIS — M25571 Pain in right ankle and joints of right foot: Secondary | ICD-10-CM | POA: Diagnosis not present

## 2016-10-08 DIAGNOSIS — M79676 Pain in unspecified toe(s): Secondary | ICD-10-CM | POA: Diagnosis not present

## 2016-10-08 DIAGNOSIS — B351 Tinea unguium: Secondary | ICD-10-CM

## 2016-10-08 NOTE — Patient Instructions (Signed)

## 2016-10-12 MED ORDER — BETAMETHASONE SOD PHOS & ACET 6 (3-3) MG/ML IJ SUSP: 3.0000 mg | Freq: Once | INTRAMUSCULAR | Status: DC

## 2016-10-12 NOTE — Progress Notes (Signed)
Subjective:  Patient presents today for follow-up evaluation of pain and tenderness to the right Achilles tendon.  Patient states the Achilles tendon still continues to hurt with no alleviation of symptoms Patient also has a new complaint of painful toenails to the bilateral second digits.   Objective/Physical Exam General: The patient is alert and oriented x3 in no acute distress.  Dermatology: Painful, hyperkeratotic, dystrophic nails to the second digits bilateral. Skin is warm, dry and supple bilateral lower extremities. Negative for open lesions or macerations.  Vascular: Palpable pedal pulses bilaterally. No edema or erythema noted. Capillary refill within normal limits.  Neurological: Epicritic and protective threshold grossly intact bilaterally.   Musculoskeletal Exam: Pain on palpation to the hyperkeratotic toenails of the second digits bilaterally.  Pain on palpation and range of motion to the Achilles tendon right.  Range of motion within normal limits to all pedal and ankle joints bilateral. Muscle strength 5/5 in all groups bilateral.   Radiographic Exam:  Normal osseous mineralization. Joint spaces preserved. No fracture/dislocation/boney destruction.    Assessment: #1 painful, hyperkeratotic, dystrophic nails digits bilateral #2 Achilles tendinitis right #3 Achilles bursitis right #4 ankle joint synovitis right 5 pain in right ankle  Plan of Care:  #1 Patient was evaluated. #2 injection 0.5 mL Celestone Soluspan injected in the patient's retrocalcaneal bursa. Care was taken to avoid the Achilles tendon during injection.  #3 total permanent nail avulsions were performed to the bilateral second digit toenails. Local anesthesia infiltration was utilized using a 50-50 mixture of 2% lidocaine plain and 0.5% Marcaine plain. After removal toenails [id number] seconds applications of phenol was utilized. Alcohol flush was performed #4 dry sterile dressings applied to the bilateral  second digits with post procedure care instructions #5 patient is to return to clinic in 2 weeks   Dr. Edrick Kins, [place]

## 2016-10-22 ENCOUNTER — Ambulatory Visit (INDEPENDENT_AMBULATORY_CARE_PROVIDER_SITE_OTHER): Payer: BLUE CROSS/BLUE SHIELD | Admitting: Podiatry

## 2016-10-22 DIAGNOSIS — M659 Synovitis and tenosynovitis, unspecified: Secondary | ICD-10-CM | POA: Diagnosis not present

## 2016-10-22 DIAGNOSIS — M79676 Pain in unspecified toe(s): Secondary | ICD-10-CM

## 2016-10-22 DIAGNOSIS — S91109D Unspecified open wound of unspecified toe(s) without damage to nail, subsequent encounter: Secondary | ICD-10-CM

## 2016-10-22 DIAGNOSIS — L03039 Cellulitis of unspecified toe: Secondary | ICD-10-CM

## 2016-10-22 DIAGNOSIS — M25571 Pain in right ankle and joints of right foot: Secondary | ICD-10-CM | POA: Diagnosis not present

## 2016-10-22 DIAGNOSIS — M7751 Other enthesopathy of right foot: Secondary | ICD-10-CM | POA: Diagnosis not present

## 2016-10-26 NOTE — Progress Notes (Signed)
Subjective:  Patient presents today for follow-up evaluation of Achilles tendinitis with bursitis to the right lower extremity.  She also complains of painful, hyperkeratotic, dystrophic nails to digits 1-5 bilaterally. Patient had a total nail avulsion performed to the bilateral second digits on the last visit. Patient states her toes are very painful and she cannot wear shoes. Patient is refusing to wear the cam boot which was prescribed on the last visit.    Objective/Physical Exam General: The patient is alert and oriented x3 in no acute distress.  Dermatology: Open wound noted to the total nail avulsion sinus second digits bilaterally. There is localized erythema likely secondary to the phenol chemical reaction. There is a moderate amount of serous drainage noted.  Vascular: Palpable pedal pulses bilaterally. No edema or erythema noted. Capillary refill within normal limits.  Neurological: Epicritic and protective threshold grossly intact bilaterally.   Musculoskeletal Exam: Pain on palpation and range of motion to the Achilles tendon area of the right. There is also pain on palpation to the second digits bilaterally status post total nail avulsions.  Assessment: #1 status post total nail avulsions bilateral second digits #2 open wounds second digits bilateral with fibrotic tissue and draining fluid #3 Achilles tendinitis right #4 Achilles bursitis right #5 ankle joint synovitis right #6 pain in right ankle   Plan of Care:  #1 Patient was evaluated. #2 medically necessary excisional debridement of the wounds were performed the second digits bilaterally. All necrotic fibrotic tissue was removed. Antibiotic ointment and a Band-Aid was applied. #3 patient is unable to wear closed toed shoes. Today postoperative shoes were dispensed bilaterally #4 patient is unable to wear cam boot because she states that it does more harm than good. Today an ankle brace was dispensed for the right  lower extremity. #5 patient is to return to clinic when necessary   Dr. Edrick Kins, [place]

## 2016-11-19 ENCOUNTER — Ambulatory Visit (INDEPENDENT_AMBULATORY_CARE_PROVIDER_SITE_OTHER): Payer: BLUE CROSS/BLUE SHIELD | Admitting: Podiatry

## 2016-11-19 DIAGNOSIS — M7661 Achilles tendinitis, right leg: Secondary | ICD-10-CM

## 2016-11-19 DIAGNOSIS — M7751 Other enthesopathy of right foot: Secondary | ICD-10-CM

## 2016-11-19 DIAGNOSIS — M25571 Pain in right ankle and joints of right foot: Secondary | ICD-10-CM

## 2016-11-19 DIAGNOSIS — M659 Synovitis and tenosynovitis, unspecified: Secondary | ICD-10-CM

## 2016-11-23 MED ORDER — BETAMETHASONE SOD PHOS & ACET 6 (3-3) MG/ML IJ SUSP: 3.0000 mg | Freq: Once | INTRAMUSCULAR | Status: DC

## 2016-11-23 NOTE — Progress Notes (Signed)
Subjective:  Patient presents today for follow-up evaluation of Achilles tendinitis with bursitis to the right lower extremity.  Patient is refusing to wear the cam boot which was prescribed on the last visit. Patient states that the cam boot makes the pain worse.    Objective/Physical Exam General: The patient is alert and oriented x3 in no acute distress.  Dermatology: Negative for open lesions or macerations  Vascular: Palpable pedal pulses bilaterally. No edema or erythema noted. Capillary refill within normal limits.  Neurological: Epicritic and protective threshold grossly intact bilaterally.   Musculoskeletal Exam: Pain on palpation and range of motion to the Achilles tendon area of the right.   Assessment: #1 Achilles tendinitis right #2 Achilles bursitis right #3 ankle joint synovitis right #4 pain in right ankle  Plan of Care:  #1 Patient was evaluated. #2 injection of 0.5 mL Celestone Soluspan injected into the retrocalcaneal bursa of the right lower extremity. Care was taken to avoid Achilles tendon. #3 discussed with the patient additional conservative modalities including physical therapy, shockwave therapy, immobilization in a cam boot.   #4 today the patient will see if the injection helps. She will return to clinic in 3 weeks. The patient is not better we will order an MRI right lower extremity.  Dr. Edrick Kins, [place]

## 2016-11-25 ENCOUNTER — Ambulatory Visit: Payer: BLUE CROSS/BLUE SHIELD | Admitting: Certified Nurse Midwife

## 2016-12-10 ENCOUNTER — Ambulatory Visit (INDEPENDENT_AMBULATORY_CARE_PROVIDER_SITE_OTHER): Payer: BLUE CROSS/BLUE SHIELD | Admitting: Podiatry

## 2016-12-10 DIAGNOSIS — L03039 Cellulitis of unspecified toe: Secondary | ICD-10-CM

## 2016-12-10 DIAGNOSIS — M65971 Unspecified synovitis and tenosynovitis, right ankle and foot: Secondary | ICD-10-CM

## 2016-12-10 DIAGNOSIS — M7661 Achilles tendinitis, right leg: Secondary | ICD-10-CM

## 2016-12-10 DIAGNOSIS — S91109D Unspecified open wound of unspecified toe(s) without damage to nail, subsequent encounter: Secondary | ICD-10-CM | POA: Diagnosis not present

## 2016-12-10 DIAGNOSIS — M25571 Pain in right ankle and joints of right foot: Secondary | ICD-10-CM

## 2016-12-10 DIAGNOSIS — M659 Synovitis and tenosynovitis, unspecified: Secondary | ICD-10-CM

## 2016-12-10 DIAGNOSIS — S91209D Unspecified open wound of unspecified toe(s) with damage to nail, subsequent encounter: Secondary | ICD-10-CM

## 2016-12-10 DIAGNOSIS — M79676 Pain in unspecified toe(s): Secondary | ICD-10-CM

## 2016-12-10 MED ORDER — DOXYCYCLINE HYCLATE 100 MG PO TABS
100.0000 mg | ORAL_TABLET | Freq: Two times a day (BID) | ORAL | 0 refills | Status: DC
Start: 2016-12-10 — End: 2017-05-31

## 2016-12-10 NOTE — Progress Notes (Addendum)
Subjective:  Patient presents today for follow-up evaluation of Achilles tendinitis with bursitis to the right lower extremity.  Patient is refusing to wear the cam boot which was prescribed on the last visit. Patient states that the cam boot makes the pain worse. Patient also presents today for follow-up evaluation of bilateral total nail avulsion procedures. Patient states that she continues to have pain sensitivity to the bilateral great toenails.    Objective/Physical Exam General: The patient is alert and oriented x3 in no acute distress.  Dermatology: Open wounds noted to the nailbeds of the bilateral second digits with a moderate amount of fibrotic debris noted. Minimal amount of granulation tissue noted. There is a minimal amount of drainage noted to the second digit left foot. There is no malodor noted. Negative for open lesions or macerations  Vascular: Palpable pedal pulses bilaterally. No edema or erythema noted. Capillary refill within normal limits.  Neurological: Epicritic and protective threshold grossly intact bilaterally.   Musculoskeletal Exam: Pain on palpation and range of motion to the Achilles tendon area of the right.   Assessment: #1 Achilles tendinitis right #2 Achilles bursitis right #3 ankle joint synovitis right #4 pain in right ankle  Plan of Care:  #1 Patient was evaluated. #2 today were going to order an MRI of the right ankle due to the non-progressive nature despite conservative management #3 prescription for doxycycline 100 mg 10 days for bilateral second digit toenail avulsion sites. #4 medically necessary excisional debridement including all fibrotic tissue down to healthy granular tissue was performed to the bilateral second digit avulsion site wounds using a tissue nipper #5 return to clinic in 4 weeks to discuss MRI findings  Dr. Edrick Kins, [place]

## 2016-12-12 ENCOUNTER — Other Ambulatory Visit: Payer: Self-pay | Admitting: Internal Medicine

## 2016-12-12 ENCOUNTER — Other Ambulatory Visit: Payer: Self-pay | Admitting: Family

## 2016-12-12 DIAGNOSIS — Z1231 Encounter for screening mammogram for malignant neoplasm of breast: Secondary | ICD-10-CM

## 2017-01-02 ENCOUNTER — Telehealth: Payer: Self-pay | Admitting: *Deleted

## 2017-01-02 DIAGNOSIS — M7661 Achilles tendinitis, right leg: Secondary | ICD-10-CM

## 2017-01-02 DIAGNOSIS — Z9889 Other specified postprocedural states: Secondary | ICD-10-CM

## 2017-01-02 NOTE — Telephone Encounter (Addendum)
Pt states she was to be scheduled for MRI and no one has called. 01/02/2017-Faxed orders to [place]. [employer] states needs [employer]ER [phone], use pt's BC[employer] ID#. 01/06/2017-Faxed orders and authorization to [facility]. 01/20/2017-Pt called for results of her MRI. I spoke with pt, as I was searching for the MRI results and suddenly she screamed in to the phone, then states "I just had a sharp pain shoot through my foot." I reviewed the MRI and told pt I felt she would benefit from an appt this week, and transferred to schedulers. 03/04/2017-Pt states she would like a knee scooter for her DOS 04/16/2017. I told her I would order the knee scooter from [facility]. 04/17/2017-Pt states she had surgery yesterday and is in a lot of pain. I spoke with pt and she states when foot is elevated she has increased pain, but pain decreased when dangling. I instructed pt to remove the surgical boot, open ended sock, and ace wrap, and dangle foot 15 minutes, this being the only time it was okay to dangle the surgery foot, then after 15 minutes place foot at hip level and rewrap the ace beginning at the toes looser, and reapply openended sock and surgical boot. Pt states she did not have a open-ended sock, and would have to wait until her husband got home at 3:00pm to perform this, because she could not put the ace back on. I told pt to perform the removal of the ace now and after the 15 minutes of dangling could just put the boot back on until her husband got home and let him rewrap as instructed. I told pt she needed to remain in the surgical boot at all times even to sleep, but if resting could have open and rotate foot and move leg in the boot. Pt states understanding. Pt called states the oxycodone are strong enough and make her sleepy. Dr. Amalia Hailey states if pt can take Ibuprofen she can take 800mg  Ibuprofen 3 times a day in between the dosing of percocet, for the next 2 days. I informed pt of the  orders.05/14/2017-Pt states Dr.Evans gave the percocet but it does not work for her she would like to trade it for the hydrocodone, and Dr. Amalia Hailey gave something for the itch so she can take that. Dr. Trinidad Curet the refill of the Hydrocodone as on 04/29/2017, pt needs to bring in the percocet rx to be destroyed. I informed pt and she states she will have to get a ride to bring her and she understands Dr. Amalia Hailey instructions.

## 2017-01-06 ENCOUNTER — Ambulatory Visit
Admission: RE | Admit: 2017-01-06 | Discharge: 2017-01-06 | Disposition: A | Discharge status: Home / Self Care | Payer: BLUE CROSS/BLUE SHIELD | Source: Ambulatory Visit | Attending: Family | Admitting: Family

## 2017-01-06 DIAGNOSIS — Z1231 Encounter for screening mammogram for malignant neoplasm of breast: Secondary | ICD-10-CM

## 2017-01-06 NOTE — Telephone Encounter (Signed)
Done. Approval [id number].  Valid from Jan12-Feb10.  Dr. Amalia Hailey

## 2017-01-07 ENCOUNTER — Ambulatory Visit: Payer: BLUE CROSS/BLUE SHIELD | Admitting: Podiatry

## 2017-01-09 ENCOUNTER — Telehealth: Payer: Self-pay | Admitting: *Deleted

## 2017-01-09 NOTE — Telephone Encounter (Signed)
On 01-09-17 fax medical records examone , it was consult note, sim & planning note, end of tx note, follow up notes.

## 2017-01-13 ENCOUNTER — Ambulatory Visit
Admission: RE | Admit: 2017-01-13 | Discharge: 2017-01-13 | Disposition: A | Discharge status: Home / Self Care | Payer: BLUE CROSS/BLUE SHIELD | Source: Ambulatory Visit | Attending: Podiatry | Admitting: Podiatry

## 2017-01-21 ENCOUNTER — Ambulatory Visit: Payer: BLUE CROSS/BLUE SHIELD | Admitting: Podiatry

## 2017-01-26 ENCOUNTER — Ambulatory Visit (INDEPENDENT_AMBULATORY_CARE_PROVIDER_SITE_OTHER): Payer: BLUE CROSS/BLUE SHIELD | Admitting: Podiatry

## 2017-01-26 DIAGNOSIS — M9261 Juvenile osteochondrosis of tarsus, right ankle: Secondary | ICD-10-CM

## 2017-01-26 DIAGNOSIS — M659 Synovitis and tenosynovitis, unspecified: Secondary | ICD-10-CM | POA: Diagnosis not present

## 2017-01-26 DIAGNOSIS — M7661 Achilles tendinitis, right leg: Secondary | ICD-10-CM

## 2017-01-26 DIAGNOSIS — M65971 Unspecified synovitis and tenosynovitis, right ankle and foot: Secondary | ICD-10-CM

## 2017-01-26 MED ORDER — HYDROCODONE-ACETAMINOPHEN 10-325 MG PO TABS
1.0000 | ORAL_TABLET | Freq: Three times a day (TID) | ORAL | 0 refills | Status: DC | PRN
Start: 2017-01-26 — End: 2017-02-23

## 2017-01-26 MED ORDER — METHYLPREDNISOLONE 4 MG PO TBPK: ORAL_TABLET | ORAL | 0 refills | Status: DC

## 2017-02-01 NOTE — Progress Notes (Signed)
Subjective:  Patient presents today for follow-up treatment and evaluation of insertional Achilles tendinitis and retrocalcaneal bursitis to the right lower extremity. Patient also has residual ankle pain. Patient states that she is still having pain without any relief.    Objective/Physical Exam General: The patient is alert and oriented x3 in no acute distress.  Dermatology: Open wounds noted to the nailbeds of the bilateral second digits with a moderate amount of fibrotic debris noted. Minimal amount of granulation tissue noted. There is a minimal amount of drainage noted to the second digit left foot. There is no malodor noted. Negative for open lesions or macerations  Vascular: Palpable pedal pulses bilaterally. No edema or erythema noted. Capillary refill within normal limits.  Neurological: Epicritic and protective threshold grossly intact bilaterally.   Musculoskeletal Exam: Pain on palpation and range of motion to the Achilles tendon area of the right.   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 Fluid in the retrocalcaneal bursa consistent with bursitis and reactive marrow edema in a prominent dorsal process the calcaneus are identified. Constellation of findings is compatible with Haglund's deformity.  Assessment: #1 Achilles tendinitis right #2 Achilles bursitis right #3 ankle joint synovitis right #4 pain in right ankle #5 Haglund's deformity right  Plan of Care:  #1 Patient was evaluated. MRI results were reviewed today #2 today were going to reinitiate a cam boot for immobilization for 4 weeks with a heel lift. If there is no improvement we will proceed with surgery. Surgery would consist of retrocalcaneal exostectomy with repair of Achilles tendon right lower extremity. #3 prescription for Vicodin #4 prescription for Medrol Dosepak #5 return to clinic in 4 weeks   Dr. Edrick Kins, [place]

## 2017-02-05 ENCOUNTER — Ambulatory Visit (HOSPITAL_COMMUNITY)
Admission: EM | Admit: 2017-02-05 | Discharge: 2017-02-05 | Disposition: A | Discharge status: Home / Self Care | Payer: BLUE CROSS/BLUE SHIELD | Attending: Internal Medicine | Admitting: Internal Medicine

## 2017-02-05 DIAGNOSIS — T148XXA Other injury of unspecified body region, initial encounter: Secondary | ICD-10-CM | POA: Diagnosis not present

## 2017-02-05 DIAGNOSIS — G8929 Other chronic pain: Secondary | ICD-10-CM

## 2017-02-05 DIAGNOSIS — M545 Low back pain: Secondary | ICD-10-CM | POA: Diagnosis not present

## 2017-02-05 DIAGNOSIS — R29898 Other symptoms and signs involving the musculoskeletal system: Secondary | ICD-10-CM

## 2017-02-05 MED ORDER — DICLOFENAC POTASSIUM 50 MG PO TABS: 50.0000 mg | ORAL_TABLET | Freq: Three times a day (TID) | ORAL | 0 refills | Status: DC

## 2017-02-05 NOTE — ED Provider Notes (Signed)
CSN: [id number]     Arrival date & time 02/05/17  1421 History   First MD Initiated Contact with Patient 02/05/17 1605     Chief Complaint  Patient presents with  . Back Pain  . Hip Pain   (Consider location/radiation/quality/duration/timing/severity/associated sxs/prior Treatment) 61 year old female presents to the urgent care complaining of back pain. Her back pain is chronic and she sees 2 different providers one of which treat his pain to the Achilles tendon and the other pain in the back. She has been receiving tramadol on a regular basis once or twice a month and on February 5 she received tramadol for pain. She states after receiving tramadol for over a year old a sudden and a work and the hydrocodone that she received 10 days ago makes her itch and she does not that. I name several anti-inflammatory medications and she states that most of the she cannot take or did not work. She states she has an appointment with one of her physicians on Monday which is in 3 days. The pain is located to the paralumbar musculature. 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      Past Medical History:  Diagnosis Date  . DDD (degenerative disc disease)    DJD.  spine.  chronic pain.   . Fibroids   . Hypertension   . Pyelonephritis 2004  . Vertigo 2014   positional.  Eval with Dr Jannifer Franklin, neuro, 2014.    Past Surgical History:  Procedure Laterality Date  . CESAREAN SECTION    . CHOLECYSTECTOMY  2001  . ERCP W/ SPHICTEROTOMY  2001   removal CBD stones  . NECK SURGERY     cadaver bones put in her neck  . 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    Family History  Problem Relation Age of Onset  . Hypertension Mother   . Cancer Mother   . Hyperlipidemia Mother   . Heart disease Father   . Cancer Father   . Heart attack Father     Social History  Substance Use Topics  . Smoking status: Former Smoker    Quit date: 12/22/2008  . Smokeless tobacco: Never Used  . 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 Negative for chills and fever.  HENT: Negative.   Respiratory: Negative.   Cardiovascular: Negative.   Musculoskeletal: Positive for back pain and myalgias.       As per HPI  Skin: Negative for color change, pallor and rash.  Neurological: Negative.   All other systems reviewed and are negative.   Allergies  Antihistamines, chlorpheniramine-type and Sulfa antibiotics  Home Medications   Prior to Admission medications   Medication Sig Start Date End Date Taking? Authorizing Provider  diazepam (VALIUM) 10 MG tablet Take 1 tablet by mouth at bedtime. 06/04/16  Yes Historical Provider, MD  doxycycline (VIBRA-TABS) 100 MG tablet Take 1 tablet (100 mg total) by mouth 2 (two) times daily. 12/10/16  Yes Dorathy Daft  Evans, DPM  furosemide (LASIX) 20 MG tablet Take 20 mg by mouth daily. She was told to take them only when she notices swelling.   Yes Historical Provider, MD  hydrochlorothiazide (HYDRODIURIL) 25 MG tablet Take 25 mg by mouth daily.  09/07/14  Yes Historical Provider, MD  HYDROcodone-acetaminophen (NORCO) 10-325 MG tablet Take 1 tablet by mouth every 8 (eight) hours as needed. 01/26/17  Yes Edrick Kins, DPM  ibuprofen (ADVIL,MOTRIN) 800 MG tablet Take 800 mg by mouth every 8 (eight) hours as needed for moderate pain.    Yes Historical Provider, MD  meclizine (ANTIVERT) 25 MG tablet Take 1 tablet (25 mg total) by mouth 3 (three) times daily as needed. 08/25/15  Yes Larene Pickett, PA-C  methylPREDNISolone (MEDROL DOSEPAK) 4 MG TBPK tablet 6 day dose pack - take as directed 01/26/17  Yes Edrick Kins, DPM  NONFORMULARY OR COMPOUNDED [place]:  Achilles Tendonitis Cream - Diclofenac 3%, Baclofen 2%, Bupivacaine 1%, Gabapentin 6%, Ibuprofen 3%, Pentoxifylline 3%, apply 1-2 grams to affected area 3-4 times daily. 08/12/16  Yes Wallene Huh, DPM  predniSONE (DELTASONE) 10 MG tablet 12 day tapering dose 07/25/16  Yes Wallene Huh, DPM  traMADol (ULTRAM) 50 MG tablet Take 50 mg by mouth every 6 (six) hours as needed for moderate pain.    Yes Historical Provider, MD  diclofenac (CATAFLAM) 50 MG tablet Take 1 tablet (50 mg total) by mouth 3 (three) times daily. One tablet TID with food prn pain. 02/05/17   Janne Napoleon, NP   Meds Ordered and Administered this Visit  Medications - No data to display  BP 104/89 (BP Location: Right Arm)   Pulse 69   Temp 98.3 F (36.8 C) (Oral)   Resp 16   LMP 11/03/2012   SpO2 100%  No data found.   Physical Exam  Constitutional: She is oriented to person, place, and time. She appears well-developed and well-nourished. No distress.  HENT:  Head: Normocephalic and atraumatic.  Eyes: EOM are normal.  Neck: Normal range of motion. Neck supple.  Cardiovascular: Normal rate.   Pulmonary/Chest: Effort normal.  Musculoskeletal: She exhibits tenderness. She exhibits no edema or deformity.  Tenderness to the paralumbar and lower parathoracic musculature with the lightest touch. Patient jumps and withdrawals to palpation. No apparent spinal tenderness or deformity.  Neurological: She is alert and oriented to person, place, and time.  Skin: Skin is warm and dry.  Psychiatric: She has a normal mood and affect.  Nursing note and vitals reviewed.   Urgent Care Course     Procedures (including critical care time)  Labs Review Labs Reviewed - No data to display  Imaging Review No results found.   Visual Acuity Review  Right Eye Distance:   Left Eye Distance:   Bilateral Distance:    Right Eye Near:   Left Eye Near:    Bilateral  Near:         MDM   1. Chronic bilateral low back pain without sciatica   2. Muscle strain   3. Muscular deconditioning    The patient states that all the work she does with bending and reaching and pulling causes her back hurt worse. This is the same pain for which she has had for years it is just getting worse and the doctor that usually prescribes her medications is out of the office today. She is asking for additional pain medication and a work note to have tomorrow off  to rest. See CRS has been reviewed. She received Norco 10 mg #30 on February 5 and prior to that she has received tramadol prescriptions for 90 at least once or twice a month through April 2017. In addition she has received diazepam. Meds ordered this encounter  Medications  . diclofenac (CATAFLAM) 50 MG tablet    Sig: Take 1 tablet (50 mg total) by mouth 3 (three) times daily. One tablet TID with food prn pain.    Dispense:  12 tablet    Refill:  0    Order Specific Question:   Supervising Provider    Answer:   Sherlene Shams [id number]       Janne Napoleon, NP 02/05/17 [phone]

## 2017-02-05 NOTE — ED Triage Notes (Signed)
C/o lower back pain States she has been in for years otc meds used as tx

## 2017-02-05 NOTE — Discharge Instructions (Signed)
It appears from the examination most of your back pain is due to muscle pain, overuse and deconditioning. He and stretches and possibly physical therapy for your back would help you the most. Follow-up with your primary care doctor to discuss these treatments. We will not be able to provide any controlled tight medications for your back pain in the urgent care. This will have to be comfortable your primary care doctor.

## 2017-02-23 ENCOUNTER — Encounter: Payer: Self-pay | Admitting: Podiatry

## 2017-02-23 ENCOUNTER — Ambulatory Visit (INDEPENDENT_AMBULATORY_CARE_PROVIDER_SITE_OTHER): Payer: BLUE CROSS/BLUE SHIELD | Admitting: Podiatry

## 2017-02-23 DIAGNOSIS — M9261 Juvenile osteochondrosis of tarsus, right ankle: Secondary | ICD-10-CM

## 2017-02-23 DIAGNOSIS — M7731 Calcaneal spur, right foot: Secondary | ICD-10-CM

## 2017-02-23 DIAGNOSIS — M7661 Achilles tendinitis, right leg: Secondary | ICD-10-CM

## 2017-02-23 MED ORDER — HYDROCODONE-ACETAMINOPHEN 10-325 MG PO TABS: 1.0000 | ORAL_TABLET | Freq: Three times a day (TID) | ORAL | 0 refills | Status: DC | PRN

## 2017-02-23 NOTE — Progress Notes (Signed)
   Subjective:  61 year old female presents to the office today for follow-up evaluation of insertional Achilles tendinitis with Haglund's deformity right foot. Patient states that the pain is been going on for approximately 2 years now with no alleviation of symptoms. Patient states that the Medrol Dosepak that she received on the last visit helped tremendously however the pain immediately returned. All conservative modalities including anti-inflammatory injections, oral anti-inflammatories, physical therapy, immobilization have failed to provide any sort of satisfactory alleviation of symptoms with the patient. Patient presents today for follow-up treatment needed evaluation   Objective/Physical Exam General: The patient is alert and oriented x3 in no acute distress.  Dermatology: Skin is warm, dry and supple bilateral lower extremities. Negative for open lesions or macerations.  Vascular: Palpable pedal pulses bilaterally. No edema or erythema noted. Capillary refill within normal limits.  Neurological: Epicritic and protective threshold grossly intact bilaterally.   Musculoskeletal Exam: Pain on palpation noted to the posterior aspect of the right heel at the insertion of the Achilles tendon into the posterior tubercle of the calcaneus.  Radiographic Exam:  Last radiographic exam of the right foot taken on 12/26/2016 indicates large posterior heel spur with possible calcific insertion of the Achilles tendon.  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 Fluid in the retrocalcaneal bursa consistent with bursitis and reactive marrow edema in a prominent dorsal process the calcaneus are identified. Constellation of findings is compatible with Haglund's deformity.  Assessment: #1 Achilles tendinitis right #2 Achilles bursitis right #3 ankle joint synovitis right #4 pain in right ankle #5 Haglund's deformity right  Plan of Care:  #1 Patient was evaluated. #2  Today we discussed additional conservative modalities including shockwave therapy. We also discussed surgical intervention which would include retrocalcaneal exostectomy with repair of achilles tendon. Patient opts for surgical correction. All patient questions were answered. No guarantees were expressed or implied. Possible complications and details of the procedure were explained. All conservative modalities have failed to provide any sort of satisfactory alleviation of symptoms for the patient. #3 today authorization for surgery was initiated. Surgery will consist of retrocalcaneal exostectomy with repair of Achilles tendon right #4 return to clinic 1 week postop   Edrick Kins, DPM [facility]  Dr. Edrick Kins, [address]                                        [place], [place] [address]                Office [phone]  Fax [phone]

## 2017-03-05 NOTE — Telephone Encounter (Signed)
Done. Dr. Evans

## 2017-03-23 ENCOUNTER — Ambulatory Visit (INDEPENDENT_AMBULATORY_CARE_PROVIDER_SITE_OTHER): Payer: BLUE CROSS/BLUE SHIELD | Admitting: Podiatry

## 2017-03-23 ENCOUNTER — Ambulatory Visit: Payer: BLUE CROSS/BLUE SHIELD | Admitting: Podiatry

## 2017-03-23 ENCOUNTER — Encounter: Payer: Self-pay | Admitting: Podiatry

## 2017-03-23 DIAGNOSIS — M659 Synovitis and tenosynovitis, unspecified: Secondary | ICD-10-CM

## 2017-03-23 DIAGNOSIS — M7661 Achilles tendinitis, right leg: Secondary | ICD-10-CM | POA: Diagnosis not present

## 2017-03-23 DIAGNOSIS — M9261 Juvenile osteochondrosis of tarsus, right ankle: Secondary | ICD-10-CM

## 2017-03-23 MED ORDER — HYDROCODONE-ACETAMINOPHEN 10-325 MG PO TABS: 1.0000 | ORAL_TABLET | Freq: Three times a day (TID) | ORAL | 0 refills | Status: DC | PRN

## 2017-03-23 NOTE — Progress Notes (Signed)
   Subjective:  Patient presents today for follow-up treatment and evaluation of Achilles tendinitis to the right lower extremity. Last visit authorization for surgery was initiated. 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 She states that she is not able to take time off of work for surgery.   Objective/Physical Exam General: The patient is alert and oriented x3 in no acute distress.  Dermatology: Skin is warm, dry and supple bilateral lower extremities. Negative for open lesions or macerations.  Vascular: Palpable pedal pulses bilaterally. No edema or erythema noted. Capillary refill within normal limits.  Neurological: Epicritic and protective threshold grossly intact bilaterally.   Musculoskeletal Exam: Pain on palpation noted to the posterior aspect of the right heel at the insertion of the Achilles tendon into the posterior tubercle of the calcaneus.  Radiographic Exam:  Last radiographic exam of the right foot taken on 12/26/2016 indicates large posterior heel spur with possible calcific insertion of the Achilles tendon.  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 Fluid in the retrocalcaneal bursa consistent with bursitis and reactive marrow edema in a prominent dorsal process the calcaneus are identified. Constellation of findings is compatible with Haglund's deformity.  Assessment: #1 Achilles tendinitis right #2 Achilles bursitis right #3 ankle joint synovitis right #4 pain in right ankle #5 Haglund's deformity right  Plan of Care:  #1 Patient was evaluated. #2 today we are going to provide a note for work refraining from work for 3 months. This will allow time for surgery and recovery. #3 refill prescription for Vicodin #4 silicone heel pad dispensed #5 return to clinic 1 week postop   Edrick Kins, DPM [facility]  Dr. Edrick Kins, [address]                                        [place], [place] [address]                Office [phone]  Fax [phone]

## 2017-03-27 ENCOUNTER — Telehealth: Payer: Self-pay | Admitting: *Deleted

## 2017-03-27 NOTE — Telephone Encounter (Signed)
Called patient to ask about rescheduling labs and test, lvm for a return call.

## 2017-03-30 ENCOUNTER — Telehealth: Payer: Self-pay | Admitting: *Deleted

## 2017-03-30 NOTE — Telephone Encounter (Signed)
I attempted to call the patient.  Her [employer] is in a grace period.  It will be terminated if a payment is not received by 04/20/2017.  She's scheduled for surgery on 04/16/2017.  I left her a message to give me a call.

## 2017-03-30 NOTE — Telephone Encounter (Signed)
"  I called my insurance and everything is taken care of now.  They lost my payment some how but everything is straight now."  Okay, that is all I needed.  "Thank you so much.  I am glad you called and reminded me to call.  God is so good!" You're welcome.

## 2017-03-30 NOTE — Telephone Encounter (Signed)
"  I'm returning the call for Tasha [name]."  I was calling to ask if you had a new insurance card.  "I am glad you called.  Let me call them now because I paid for the whole year already.  I will call you back."

## 2017-04-15 ENCOUNTER — Telehealth: Payer: Self-pay | Admitting: *Deleted

## 2017-04-15 NOTE — Telephone Encounter (Signed)
"  I been missing the surgical lady that's supposed to call me to go over my paperwork.  I keep missing her.  I need to talk to someone before I go for surgery in the morning.  I told them I wasn't good at doing stuff on the computer and I was told a nurse would call me.  I'd like someone to call me before 5 pm."  I called and informed her that Caren Griffins said don't worry about the paperwork.  They will get the information in the morning.  "Well I didn't want them to think I was ignoring them."  They are okay.

## 2017-04-16 ENCOUNTER — Telehealth: Payer: Self-pay | Admitting: Sports Medicine

## 2017-04-16 ENCOUNTER — Ambulatory Visit: Payer: BLUE CROSS/BLUE SHIELD

## 2017-04-16 ENCOUNTER — Ambulatory Visit (HOSPITAL_COMMUNITY): Payer: BLUE CROSS/BLUE SHIELD

## 2017-04-16 ENCOUNTER — Encounter: Payer: Self-pay | Admitting: Podiatry

## 2017-04-16 DIAGNOSIS — S96911S Strain of unspecified muscle and tendon at ankle and foot level, right foot, sequela: Secondary | ICD-10-CM | POA: Diagnosis not present

## 2017-04-16 DIAGNOSIS — M7731 Calcaneal spur, right foot: Secondary | ICD-10-CM | POA: Diagnosis not present

## 2017-04-16 NOTE — Telephone Encounter (Signed)
Patient called asking ?s if she could take her boot off. I advised patient to keep boot on at all times when ambulating with crutches to protect her surgical site. Patient also asked if she could take hydralizine with pain medication I advised her to not take medications together. Patient expressed understanding. -Dr. Cannon Kettle

## 2017-04-17 ENCOUNTER — Ambulatory Visit: Payer: BLUE CROSS/BLUE SHIELD | Admitting: Radiation Oncology

## 2017-04-18 ENCOUNTER — Telehealth: Payer: Self-pay | Admitting: Sports Medicine

## 2017-04-18 NOTE — Telephone Encounter (Signed)
Patient called stating that she was trying to use knee scooter without boot and accidentally put pressure on her surgical leg and now has pain with a spot of bleeding on her bandage. I advised patient to ice, elevate, and to replace her ACE wrap. I reminded patient to not to attempt to ambulate without the protection of her boot. Patient expressed understanding and I advised patient to call back if there are any other issues. -Dr. Cannon Kettle

## 2017-04-20 ENCOUNTER — Other Ambulatory Visit: Payer: Self-pay

## 2017-04-20 ENCOUNTER — Encounter (HOSPITAL_COMMUNITY): Payer: Self-pay | Admitting: Emergency Medicine

## 2017-04-20 ENCOUNTER — Emergency Department (HOSPITAL_COMMUNITY): Payer: BLUE CROSS/BLUE SHIELD

## 2017-04-20 ENCOUNTER — Ambulatory Visit: Payer: BLUE CROSS/BLUE SHIELD | Admitting: Radiation Oncology

## 2017-04-20 DIAGNOSIS — R0789 Other chest pain: Secondary | ICD-10-CM | POA: Diagnosis not present

## 2017-04-20 DIAGNOSIS — Z5321 Procedure and treatment not carried out due to patient leaving prior to being seen by health care provider: Secondary | ICD-10-CM | POA: Diagnosis not present

## 2017-04-20 LAB — I-STAT TROPONIN, ED: Troponin i, poc: 0.01 ng/mL (ref 0.00–0.08)

## 2017-04-20 LAB — BASIC METABOLIC PANEL
ANION GAP: 8 (ref 5–15)
BUN: 12 mg/dL (ref 6–20)
CHLORIDE: 108 mmol/L (ref 101–111)
CO2: 25 mmol/L (ref 22–32)
Calcium: 9.1 mg/dL (ref 8.9–10.3)
Creatinine, Ser: 0.87 mg/dL (ref 0.44–1.00)
GFR calc Af Amer: >60 mL/min (ref >60)
GLUCOSE: 155 mg/dL — AB (ref 65–99)
POTASSIUM: 3 mmol/L — AB (ref 3.5–5.1)
Sodium: 141 mmol/L (ref 135–145)

## 2017-04-20 LAB — CBC
HEMATOCRIT: 31.2 % — AB (ref 36.0–46.0)
HEMOGLOBIN: 10.4 g/dL — AB (ref 12.0–15.0)
MCH: 29 pg (ref 26.0–34.0)
MCHC: 33.3 g/dL (ref 30.0–36.0)
MCV: 86.9 fL (ref 78.0–100.0)
Platelets: 248 10*3/uL (ref 150–400)
RBC: 3.59 MIL/uL — ABNORMAL LOW (ref 3.87–5.11)
RDW: 14.9 % (ref 11.5–15.5)
WBC: 6.4 10*3/uL (ref 4.0–10.5)

## 2017-04-20 NOTE — ED Triage Notes (Signed)
Pt reports centralized chest tightness and SOB for two days.  Denies dizziness, diaphoresis but complains of weakness.  She also has a productive cough that started today.

## 2017-04-20 NOTE — ED Notes (Signed)
Patient called for vital signs, no answer.

## 2017-04-21 ENCOUNTER — Emergency Department (HOSPITAL_COMMUNITY)
Admission: EM | Admit: 2017-04-21 | Discharge: 2017-04-21 | Disposition: A | Discharge status: Home / Self Care | Payer: BLUE CROSS/BLUE SHIELD | Attending: Emergency Medicine | Admitting: Emergency Medicine

## 2017-04-21 NOTE — ED Notes (Signed)
Pt called for room X2

## 2017-04-22 ENCOUNTER — Ambulatory Visit (INDEPENDENT_AMBULATORY_CARE_PROVIDER_SITE_OTHER): Payer: Self-pay | Admitting: Podiatry

## 2017-04-22 ENCOUNTER — Ambulatory Visit (INDEPENDENT_AMBULATORY_CARE_PROVIDER_SITE_OTHER): Payer: BLUE CROSS/BLUE SHIELD

## 2017-04-22 DIAGNOSIS — Z9889 Other specified postprocedural states: Secondary | ICD-10-CM

## 2017-04-24 NOTE — Progress Notes (Signed)
   Subjective:  Patient presents today status post retrocalcaneal exostectomy right. DOS:04/16/17. Patient reports falling today.   Objective/Physical Exam Skin incisions appear to be well coapted with sutures and staples intact. No sign of infectious process noted. No dehiscence. No active bleeding noted. Moderate edema noted to the surgical extremity.  Radiographic Exam:  Orthopedic hardware and osteotomies sites appear to be stable with routine healing.  Assessment: 1. s/p retrocalcaneal exostectomy surgery of the right lower extremity. DOS: 04/16/17.   Plan of Care:  1. Patient was evaluated. X-rays reviewed 2. Dressings changed. 3. Return to clinic in 1 week.   Edrick Kins, DPM [facility]  Dr. Edrick Kins, [address]                                        [place], [place] [address]                Office [phone]  Fax [phone]

## 2017-04-28 ENCOUNTER — Ambulatory Visit: Payer: BLUE CROSS/BLUE SHIELD

## 2017-04-29 ENCOUNTER — Ambulatory Visit (INDEPENDENT_AMBULATORY_CARE_PROVIDER_SITE_OTHER): Payer: Self-pay | Admitting: Podiatry

## 2017-04-29 ENCOUNTER — Ambulatory Visit (INDEPENDENT_AMBULATORY_CARE_PROVIDER_SITE_OTHER): Payer: BLUE CROSS/BLUE SHIELD

## 2017-04-29 DIAGNOSIS — Z9889 Other specified postprocedural states: Secondary | ICD-10-CM | POA: Diagnosis not present

## 2017-04-29 MED ORDER — HYDROCODONE-ACETAMINOPHEN 10-325 MG PO TABS: 1.0000 | ORAL_TABLET | Freq: Three times a day (TID) | ORAL | 0 refills | Status: DC | PRN

## 2017-04-30 ENCOUNTER — Ambulatory Visit (INDEPENDENT_AMBULATORY_CARE_PROVIDER_SITE_OTHER): Payer: BLUE CROSS/BLUE SHIELD | Admitting: Podiatry

## 2017-04-30 ENCOUNTER — Telehealth: Payer: Self-pay | Admitting: Podiatry

## 2017-04-30 DIAGNOSIS — T8130XA Disruption of wound, unspecified, initial encounter: Secondary | ICD-10-CM | POA: Diagnosis not present

## 2017-04-30 DIAGNOSIS — Z9889 Other specified postprocedural states: Secondary | ICD-10-CM | POA: Diagnosis not present

## 2017-04-30 DIAGNOSIS — M9261 Juvenile osteochondrosis of tarsus, right ankle: Secondary | ICD-10-CM

## 2017-04-30 MED ORDER — CEPHALEXIN 500 MG PO CAPS: 500.0000 mg | ORAL_CAPSULE | Freq: Three times a day (TID) | ORAL | 2 refills | Status: DC

## 2017-04-30 MED ORDER — OXYCODONE-ACETAMINOPHEN 5-325 MG PO TABS: 1.0000 | ORAL_TABLET | Freq: Four times a day (QID) | ORAL | 0 refills | Status: DC | PRN

## 2017-04-30 NOTE — Telephone Encounter (Signed)
Dr. Amalia Hailey prescribed Hydrocodone-acetaminophen 10-325 MG yesterday. Patient stated she is allergic and it is making her itch really bad and she is breaking out into hives. Request a new pain medication. Can call home [phone] or Mobile [phone].

## 2017-04-30 NOTE — Patient Instructions (Signed)

## 2017-04-30 NOTE — Telephone Encounter (Signed)
Left message informing pt that she should stop the Hydrocodone, begin OTC Benadryl as package directs for the itching and hives and if she had swelling of her face or mouth, or difficulty breathing she should go to the ER. I told pt I would discuss a change of medication with Dr. Amalia Hailey and call again but if she needed pain medications she should take OTC Ibuprofen as the package directs if she tolerates ibuprofen.

## 2017-05-01 ENCOUNTER — Encounter: Payer: Self-pay | Admitting: Podiatry

## 2017-05-01 DIAGNOSIS — S86102S Unspecified injury of other muscle(s) and tendon(s) of posterior muscle group at lower leg level, left leg, sequela: Secondary | ICD-10-CM | POA: Diagnosis not present

## 2017-05-01 NOTE — Progress Notes (Signed)
Subjective: 61 year old female presents the office is an emergency appointment. She states that she tripped this morning and she feels that she busted open her sutures and there is bleeding present and there is blood on the boot. She denies any other recent injury at the time of the fall but she just tripped. She states that she was wearing the boot at the time of the injury. She also states that she called earlier because the Norco is causing her itching should go back to the Percocet. Denies any systemic complaints such as fevers, chills, nausea, vomiting. No acute changes since last appointment, and no other complaints at this time.   Objective: AAO x3, NAD DP/PT pulses palpable bilaterally, CRT less than 3 seconds On evaluation of the posterior heel there is a complete dehiscence of the entire incision with retraction of the skin approximated 4 cm. There is no swelling erythema, ascending cellulitis. There is no active bleeding present. There is quite a bit of tenderness palpation along the surgical site. There is no debris present within the wound. Unable to evaluate the Achilles tendon today due to pain. No open lesions or pre-ulcerative lesions.  No pain with calf compression, swelling, warmth, erythema  Assessment: 61 year old female with complete dehiscence of posterior heel right foot status post fall  Plan: -All treatment options discussed with the patient including all alternatives, risks, complications.  -Upon evaluation in the wound I recommended her to return to surgery soon as possible for wound debridement, hopeful closure. Also that time evaluate the integrity the Achilles tendon and possible repair if needed. I also discussed that she will most likely be in a cast after surgery. We will plan on doing this at 7 AM tomorrow morning. I discussed with the surgery as well as the postoperative course at this point we will go ahead and proceed with surgery tomorrow with either Dr. Amalia Hailey or  myself. Discussed with her hopefully get the wound closed does not she may require wound VAC or other local wound care. -The incision placement as well as the postoperative course was discussed with the patient. I discussed risks of the surgery which include, but not limited to, infection, bleeding, pain, swelling, need for further surgery, delayed or nonhealing, painful or ugly scar, numbness or sensation changes, over/under correction, recurrence, transfer lesions, further deformity, hardware failure, DVT/PE, loss of toe/foot. Patient understands these risks and wishes to proceed with surgery. The surgical consent was reviewed with the patient all 3 pages were signed. No promises or guarantees were given to the outcome of the procedure. All questions were answered to the best of my ability. Before the surgery the patient was encouraged to call the office if there is any further questions. The surgery will be performed at the [facility] on an outpatient basis. -Discussed the case with Dr. Amalia Hailey. He'll plan on doing surgery tomorrow. -In CAM boot was dispensed today. She is to be nonweightbearing. She'll be casted in the morning after surgery. -Prescribed Keflex to the open wound. Also prescribed Percocet. -Patient encouraged to call the office with any questions, concerns, change in symptoms.   Celesta Gentile, DPM

## 2017-05-01 NOTE — Progress Notes (Signed)
   Subjective:  Patient presents today status post retrocalcaneal exostectomy right. DOS:04/16/17. Patient reports falling yesterday. She states the foot is very painful.   Objective/Physical Exam Skin incisions appear to be well coapted with sutures and staples intact. No sign of infectious process noted. No dehiscence. No active bleeding noted. Moderate edema noted to the surgical extremity.  Radiographic Exam:  Orthopedic hardware and osteotomies sites appear to be stable with routine healing.  Assessment: 1. s/p retrocalcaneal exostectomy surgery of the right lower extremity. DOS: 04/16/17.   Plan of Care:  1. Patient was evaluated. X-rays reviewed 2. Dressings changed. 3. Prescription for Percocet 5/325 mg given. 4. Return to clinic in 1 week for suture removal.   Edrick Kins, DPM [facility]  Dr. Edrick Kins, [address]                                        [place], [place] [address]                Office [phone]  Fax [phone]

## 2017-05-06 ENCOUNTER — Ambulatory Visit (INDEPENDENT_AMBULATORY_CARE_PROVIDER_SITE_OTHER): Payer: BLUE CROSS/BLUE SHIELD

## 2017-05-06 ENCOUNTER — Ambulatory Visit (INDEPENDENT_AMBULATORY_CARE_PROVIDER_SITE_OTHER): Payer: Self-pay | Admitting: Podiatry

## 2017-05-06 ENCOUNTER — Ambulatory Visit: Payer: BLUE CROSS/BLUE SHIELD

## 2017-05-06 DIAGNOSIS — Z9889 Other specified postprocedural states: Secondary | ICD-10-CM | POA: Diagnosis not present

## 2017-05-06 MED ORDER — HYDROXYZINE HCL 10 MG PO TABS: 10.0000 mg | ORAL_TABLET | Freq: Three times a day (TID) | ORAL | 0 refills | Status: DC | PRN

## 2017-05-06 MED ORDER — OXYCODONE-ACETAMINOPHEN 5-325 MG PO TABS: 1.0000 | ORAL_TABLET | Freq: Three times a day (TID) | ORAL | 0 refills | Status: DC | PRN

## 2017-05-07 NOTE — Progress Notes (Signed)
   Subjective:  Patient presents today status post revision repair of right Achilles. DOS: 05/01/17. She states the cast is rubbing against the incision site and is causing pain.    Objective/Physical Exam Skin incisions appear to be well coapted with sutures and staples intact. No sign of infectious process noted. No dehiscence. No active bleeding noted. Moderate edema noted to the surgical extremity.  Radiographic Exam:  Orthopedic hardware and osteotomies sites appear to be stable with routine healing.  Assessment: 1. s/p revision repair of right Achilles DOS: 05/01/17   Plan of Care:  1. Patient was evaluated. X-rays reviewed 2. Prescription for Percocet 5/325 mg given. 3. prescription for Atarax given. 4. Return to clinic in 1 week for cast change.   Edrick Kins, DPM [facility]  Dr. Edrick Kins, [address]                                        [place], [place] [address]                Office [phone]  Fax [phone]

## 2017-05-12 NOTE — Progress Notes (Signed)
1. Retrocalcaneal exostectomy right 2. Repair of achilles tendon right

## 2017-05-13 ENCOUNTER — Encounter: Payer: Self-pay | Admitting: Podiatry

## 2017-05-13 ENCOUNTER — Ambulatory Visit (INDEPENDENT_AMBULATORY_CARE_PROVIDER_SITE_OTHER): Payer: BLUE CROSS/BLUE SHIELD | Admitting: Podiatry

## 2017-05-13 DIAGNOSIS — Z9889 Other specified postprocedural states: Secondary | ICD-10-CM

## 2017-05-13 MED ORDER — OXYCODONE-ACETAMINOPHEN 5-325 MG PO TABS: 1.0000 | ORAL_TABLET | Freq: Three times a day (TID) | ORAL | 0 refills | Status: DC | PRN

## 2017-05-14 MED ORDER — HYDROCODONE-ACETAMINOPHEN 10-325 MG PO TABS: 1.0000 | ORAL_TABLET | Freq: Three times a day (TID) | ORAL | 0 refills | Status: DC | PRN

## 2017-05-14 NOTE — Progress Notes (Signed)
1. Right foot revision of wound with hopeful closure 2. Evaluation and possible repair of achilles tendon. 3. Cast application

## 2017-05-15 NOTE — Progress Notes (Signed)
   Subjective:  Patient presents today status post revision repair of right Achilles. DOS: 05/01/17. She states she is doing better and has no complaints at this time.   Objective/Physical Exam Skin incisions appear to be well coapted with sutures and staples intact. No sign of infectious process noted. No dehiscence. No active bleeding noted. Moderate edema noted to the surgical extremity.   Assessment: 1. s/p revision repair of right Achilles DOS: 05/01/17   Plan of Care:  1. Patient was evaluated.  2. Dressing change. 3. Unna boot applied. 4. Continue nonweightbearing in cam boot with heel lift. 5. Return to clinic in 1 week.   Edrick Kins, DPM [facility]  Dr. Edrick Kins, [address]                                        [place], [place] [address]                Office [phone]  Fax [phone]

## 2017-05-20 ENCOUNTER — Encounter: Payer: Self-pay | Admitting: Podiatry

## 2017-05-20 ENCOUNTER — Ambulatory Visit (INDEPENDENT_AMBULATORY_CARE_PROVIDER_SITE_OTHER): Payer: BLUE CROSS/BLUE SHIELD | Admitting: Podiatry

## 2017-05-20 DIAGNOSIS — Z9889 Other specified postprocedural states: Secondary | ICD-10-CM

## 2017-05-20 MED ORDER — HYDROCODONE-ACETAMINOPHEN 10-325 MG PO TABS: 1.0000 | ORAL_TABLET | Freq: Three times a day (TID) | ORAL | 0 refills | Status: DC | PRN

## 2017-05-21 NOTE — Progress Notes (Signed)
   Subjective:  Patient presents today status post revision repair of right Achilles. DOS: 05/01/17. She states she did not fill the prescription for oxycodone because she cannot take it. She brought the prescription with her today. She is requesting a prescription for hydrocodone.   Objective/Physical Exam Skin incisions appear to be well coapted with sutures and staples intact. No sign of infectious process noted. No dehiscence. No active bleeding noted. Moderate edema noted to the surgical extremity.   Assessment: 1. s/p revision repair of right Achilles DOS: 05/01/17   Plan of Care:  1. Patient was evaluated.  2. Dressing change. 3. Return to clinic in 2 weeks for suture/staple removal.   Edrick Kins, DPM [facility]  Dr. Edrick Kins, [address]                                        [place], [place] [address]                Office [phone]  Fax [phone]

## 2017-05-31 ENCOUNTER — Encounter (HOSPITAL_COMMUNITY): Payer: Self-pay | Admitting: Emergency Medicine

## 2017-05-31 ENCOUNTER — Observation Stay (HOSPITAL_COMMUNITY): Payer: BLUE CROSS/BLUE SHIELD

## 2017-05-31 ENCOUNTER — Emergency Department (HOSPITAL_COMMUNITY): Payer: BLUE CROSS/BLUE SHIELD

## 2017-05-31 ENCOUNTER — Observation Stay (HOSPITAL_COMMUNITY)
Admission: EM | Admit: 2017-05-31 | Discharge: 2017-06-01 | Disposition: A | Discharge status: Home / Self Care | Payer: BLUE CROSS/BLUE SHIELD | Attending: Internal Medicine | Admitting: Internal Medicine

## 2017-05-31 DIAGNOSIS — Z882 Allergy status to sulfonamides status: Secondary | ICD-10-CM | POA: Diagnosis not present

## 2017-05-31 DIAGNOSIS — R0789 Other chest pain: Principal | ICD-10-CM | POA: Insufficient documentation

## 2017-05-31 DIAGNOSIS — Z79899 Other long term (current) drug therapy: Secondary | ICD-10-CM | POA: Insufficient documentation

## 2017-05-31 DIAGNOSIS — E876 Hypokalemia: Secondary | ICD-10-CM | POA: Diagnosis not present

## 2017-05-31 DIAGNOSIS — E785 Hyperlipidemia, unspecified: Secondary | ICD-10-CM | POA: Diagnosis present

## 2017-05-31 DIAGNOSIS — R1013 Epigastric pain: Secondary | ICD-10-CM

## 2017-05-31 DIAGNOSIS — I10 Essential (primary) hypertension: Secondary | ICD-10-CM | POA: Diagnosis present

## 2017-05-31 DIAGNOSIS — K2970 Gastritis, unspecified, without bleeding: Secondary | ICD-10-CM | POA: Diagnosis not present

## 2017-05-31 DIAGNOSIS — Z888 Allergy status to other drugs, medicaments and biological substances status: Secondary | ICD-10-CM | POA: Diagnosis not present

## 2017-05-31 DIAGNOSIS — R072 Precordial pain: Secondary | ICD-10-CM

## 2017-05-31 DIAGNOSIS — G8929 Other chronic pain: Secondary | ICD-10-CM | POA: Insufficient documentation

## 2017-05-31 DIAGNOSIS — Z8739 Personal history of other diseases of the musculoskeletal system and connective tissue: Secondary | ICD-10-CM | POA: Diagnosis not present

## 2017-05-31 DIAGNOSIS — Z809 Family history of malignant neoplasm, unspecified: Secondary | ICD-10-CM

## 2017-05-31 DIAGNOSIS — Z9889 Other specified postprocedural states: Secondary | ICD-10-CM

## 2017-05-31 DIAGNOSIS — Z87891 Personal history of nicotine dependence: Secondary | ICD-10-CM | POA: Insufficient documentation

## 2017-05-31 DIAGNOSIS — R079 Chest pain, unspecified: Secondary | ICD-10-CM | POA: Diagnosis present

## 2017-05-31 DIAGNOSIS — I6522 Occlusion and stenosis of left carotid artery: Secondary | ICD-10-CM | POA: Diagnosis not present

## 2017-05-31 DIAGNOSIS — M549 Dorsalgia, unspecified: Secondary | ICD-10-CM | POA: Insufficient documentation

## 2017-05-31 DIAGNOSIS — Z885 Allergy status to narcotic agent status: Secondary | ICD-10-CM | POA: Insufficient documentation

## 2017-05-31 DIAGNOSIS — Z8249 Family history of ischemic heart disease and other diseases of the circulatory system: Secondary | ICD-10-CM | POA: Insufficient documentation

## 2017-05-31 DIAGNOSIS — Z9049 Acquired absence of other specified parts of digestive tract: Secondary | ICD-10-CM

## 2017-05-31 DIAGNOSIS — Z8349 Family history of other endocrine, nutritional and metabolic diseases: Secondary | ICD-10-CM

## 2017-05-31 LAB — CBC
HCT: 35.2 % — ABNORMAL LOW (ref 36.0–46.0)
Hemoglobin: 11.7 g/dL — ABNORMAL LOW (ref 12.0–15.0)
MCH: 29.3 pg (ref 26.0–34.0)
MCHC: 33.2 g/dL (ref 30.0–36.0)
MCV: 88.2 fL (ref 78.0–100.0)
PLATELETS: 245 10*3/uL (ref 150–400)
RBC: 3.99 MIL/uL (ref 3.87–5.11)
RDW: 15.4 % (ref 11.5–15.5)
WBC: 4.7 10*3/uL (ref 4.0–10.5)

## 2017-05-31 LAB — BASIC METABOLIC PANEL
Anion gap: 8 (ref 5–15)
BUN: 12 mg/dL (ref 6–20)
CALCIUM: 9.8 mg/dL (ref 8.9–10.3)
CHLORIDE: 108 mmol/L (ref 101–111)
CO2: 23 mmol/L (ref 22–32)
CREATININE: 0.76 mg/dL (ref 0.44–1.00)
GFR calc non Af Amer: >60 mL/min (ref >60)
GLUCOSE: 121 mg/dL — AB (ref 65–99)
Potassium: 2.7 mmol/L — CL (ref 3.5–5.1)
Sodium: 139 mmol/L (ref 135–145)

## 2017-05-31 LAB — TROPONIN I
TROPONIN I: 0.03 ng/mL — AB (ref <0.03)
TROPONIN I: 0.03 ng/mL — AB (ref <0.03)

## 2017-05-31 LAB — I-STAT TROPONIN, ED: TROPONIN I, POC: 0 ng/mL (ref 0.00–0.08)

## 2017-05-31 LAB — D-DIMER, QUANTITATIVE: D-Dimer, Quant: 1.43 ug/mL-FEU — ABNORMAL HIGH (ref 0.00–0.50)

## 2017-05-31 MED ORDER — SENNOSIDES-DOCUSATE SODIUM 8.6-50 MG PO TABS
1.0000 | ORAL_TABLET | Freq: Every day | ORAL | Status: DC
  Administered 2017-05-31: 1 via ORAL
  Filled 2017-05-31: qty 1

## 2017-05-31 MED ORDER — ACETAMINOPHEN 325 MG PO TABS
650.0000 mg | ORAL_TABLET | Freq: Four times a day (QID) | ORAL | Status: DC | PRN
  Administered 2017-05-31: 650 mg via ORAL
  Filled 2017-05-31: qty 2

## 2017-05-31 MED ORDER — POTASSIUM CHLORIDE CRYS ER 20 MEQ PO TBCR: 40.0000 meq | EXTENDED_RELEASE_TABLET | Freq: Once | ORAL | Status: DC

## 2017-05-31 MED ORDER — ACETAMINOPHEN 650 MG RE SUPP: 650.0000 mg | Freq: Four times a day (QID) | RECTAL | Status: DC | PRN

## 2017-05-31 MED ORDER — SODIUM CHLORIDE 0.9 % IV SOLN
INTRAVENOUS | Status: DC
  Administered 2017-05-31: 13:00:00 via INTRAVENOUS

## 2017-05-31 MED ORDER — POTASSIUM CHLORIDE 10 MEQ/100ML IV SOLN
10.0000 meq | Freq: Once | INTRAVENOUS | Status: AC
  Administered 2017-05-31: 10 meq via INTRAVENOUS

## 2017-05-31 MED ORDER — POTASSIUM CHLORIDE 10 MEQ/100ML IV SOLN
10.0000 meq | Freq: Once | INTRAVENOUS | Status: DC
  Filled 2017-05-31: qty 100

## 2017-05-31 MED ORDER — GI COCKTAIL ~~LOC~~: 30.0000 mL | Freq: Two times a day (BID) | ORAL | Status: DC | PRN

## 2017-05-31 MED ORDER — ENOXAPARIN SODIUM 40 MG/0.4ML ~~LOC~~ SOLN
40.0000 mg | SUBCUTANEOUS | Status: DC
  Administered 2017-05-31: 40 mg via SUBCUTANEOUS
  Filled 2017-05-31: qty 0.4

## 2017-05-31 MED ORDER — POTASSIUM CHLORIDE CRYS ER 20 MEQ PO TBCR
40.0000 meq | EXTENDED_RELEASE_TABLET | Freq: Two times a day (BID) | ORAL | Status: AC
  Administered 2017-05-31 – 2017-06-01 (×2): 40 meq via ORAL
  Filled 2017-05-31 (×2): qty 2

## 2017-05-31 MED ORDER — PRAVASTATIN SODIUM 40 MG PO TABS: 40.0000 mg | ORAL_TABLET | Freq: Every day | ORAL | Status: DC

## 2017-05-31 MED ORDER — IBUPROFEN 600 MG PO TABS
600.0000 mg | ORAL_TABLET | Freq: Three times a day (TID) | ORAL | Status: DC | PRN
  Administered 2017-05-31: 600 mg via ORAL
  Filled 2017-05-31: qty 1

## 2017-05-31 MED ORDER — HYDROCODONE-ACETAMINOPHEN 10-325 MG PO TABS: 1.0000 | ORAL_TABLET | Freq: Three times a day (TID) | ORAL | Status: DC | PRN

## 2017-05-31 MED ORDER — PANTOPRAZOLE SODIUM 40 MG IV SOLR
40.0000 mg | Freq: Two times a day (BID) | INTRAVENOUS | Status: DC
  Administered 2017-05-31 – 2017-06-01 (×2): 40 mg via INTRAVENOUS
  Filled 2017-05-31 (×2): qty 40

## 2017-05-31 MED ORDER — IBUPROFEN 400 MG PO TABS: 800.0000 mg | ORAL_TABLET | Freq: Three times a day (TID) | ORAL | Status: DC | PRN

## 2017-05-31 MED ORDER — POTASSIUM CHLORIDE CRYS ER 20 MEQ PO TBCR
40.0000 meq | EXTENDED_RELEASE_TABLET | Freq: Once | ORAL | Status: AC
  Administered 2017-05-31: 40 meq via ORAL
  Filled 2017-05-31: qty 2

## 2017-05-31 MED ORDER — IOPAMIDOL (ISOVUE-370) INJECTION 76%
INTRAVENOUS | Status: AC
  Administered 2017-05-31: 100 mL via INTRAVENOUS
  Filled 2017-05-31: qty 100

## 2017-05-31 NOTE — ED Notes (Signed)
Ct notified that pt ready for scan

## 2017-05-31 NOTE — ED Notes (Signed)
Date and time results received: 05/31/17 1226 (use smartphrase ".now" to insert current time)  Test: K+ Critical Value: 2.7  Name of Provider Notified: Dr Rogene Houston  Orders Received? Or Actions Taken?:

## 2017-05-31 NOTE — ED Provider Notes (Signed)
Angiocath insertion Performed by: Lolita Patella  Consent: Verbal consent obtained. Risks and benefits: risks, benefits and alternatives were discussed Time out: Immediately prior to procedure a "time out" was called to verify the correct patient, procedure, equipment, support staff and site/side marked as required.  Preparation: Patient was prepped and draped in the usual sterile fashion.  Vein Location: Left AC, Basilic  +Ultrasound Guided  Gauge: 20  Normal blood return and flush without difficulty Patient tolerance: Patient tolerated the procedure well with no immediate complications.      Tanna Furry, MD 05/31/17 [id number]

## 2017-05-31 NOTE — ED Notes (Signed)
Iv team at bedside  

## 2017-05-31 NOTE — ED Provider Notes (Signed)
[facility] DEPT Provider Note   CSN: [id number] Arrival date & time: 05/31/17  1103     History   Chief Complaint Chief Complaint  Patient presents with  . Chest Pain    HPI Tasha [name] is a 61 y.o. female.  Patient presenting with a complaint of substernal chest pain on and off for over a week. But got worse this morning. Associated with some shortness of breath. Patient had recent surgery on her right foot by podiatry. This was back in April but the wound had opened up and had a be reoperated on 2 weeks later.      Past Medical History:  Diagnosis Date  . DDD (degenerative disc disease)    DJD.  spine.  chronic pain.   . Fibroids   . Hypertension   . Pyelonephritis 2004  . Vertigo 2014   positional.  Eval with Dr Jannifer Franklin, neuro, 2014.     Patient Active Problem List   Diagnosis Date Noted  . Left-sided carotid artery disease ([place]) 06/17/2016  . Essential hypertension 06/17/2016  . Hyperlipidemia 06/17/2016  . Chest pain 06/17/2016  . Palpitations 06/17/2016  . 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 Hypokalemia 07/26/2014  . Constipation 07/26/2014  . BRBPR (bright red blood per rectum) 07/26/2014  . Benign paroxysmal positional vertigo 05/12/2014  . Dizziness and giddiness 09/27/2013  . Fibroids 03/03/2013  . Postmenopausal bleeding 03/03/2013  . Pelvic pain 03/03/2013    Past Surgical History:  Procedure Laterality Date  . CESAREAN SECTION    . CHOLECYSTECTOMY  2001  . ERCP W/ SPHICTEROTOMY  2001   removal CBD stones  . NECK SURGERY     cadaver bones put in her neck  . 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     OB History    Gravida Para Term Preterm AB Living   4 1 1   3 1    SAB TAB Ectopic Multiple Live Births   2       1       Home Medications    Prior to Admission medications   Medication Sig Start Date End Date Taking? Authorizing Provider    hydrochlorothiazide (HYDRODIURIL) 25 MG tablet Take 25 mg by mouth daily.  09/07/14  Yes [provider]  ibuprofen (ADVIL,MOTRIN) 800 MG tablet Take 800 mg by mouth every 8 (eight) hours as needed for moderate pain.    Yes [provider]  LIVALO 2 MG TABS Take 2 mg by mouth daily. 05/30/17  Yes [provider]  diclofenac (CATAFLAM) 50 MG tablet Take 1 tablet (50 mg total) by mouth 3 (three) times daily. One tablet TID with food prn pain. Patient not taking: Reported on 05/31/2017 02/05/17   Janne Napoleon, NP  HYDROcodone-acetaminophen (NORCO) 10-325 MG tablet Take 1 tablet by mouth every 8 (eight) hours as needed. Patient not taking: Reported on 05/31/2017 05/14/17   Edrick Kins, DPM  HYDROcodone-acetaminophen [facility]) 10-325 MG tablet Take 1 tablet by mouth every 8 (eight) hours as needed. Patient not taking: Reported on 05/31/2017 05/20/17   Edrick Kins, DPM  hydrOXYzine (ATARAX/VISTARIL) 10 MG tablet Take 1 tablet (10 mg total) by mouth 3 (three) times daily as needed. Patient not taking: Reported on 05/31/2017 05/06/17   Edrick Kins, DPM  meclizine (ANTIVERT) 25 MG tablet Take 1 tablet (25 mg total) by mouth 3 (three) times daily as needed. Patient not taking: Reported on 05/31/2017 08/25/15  Larene Pickett, PA-C  NONFORMULARY OR COMPOUNDED ITEM [facility]:  Achilles Tendonitis Cream - Diclofenac 3%, Baclofen 2%, Bupivacaine 1%, Gabapentin 6%, Ibuprofen 3%, Pentoxifylline 3%, apply 1-2 grams to affected area 3-4 times daily. Patient not taking: Reported on 05/31/2017 08/12/16   Wallene Huh, DPM  oxyCODONE-acetaminophen (ROXICET) 5-325 MG tablet Take 1 tablet by mouth every 8 (eight) hours as needed for severe pain. Patient not taking: Reported on 05/31/2017 05/13/17   Edrick Kins, DPM    Family History Family History  Problem Relation Age of Onset  . Hypertension Mother   . Cancer Mother   . Hyperlipidemia Mother   . Heart disease Father   . Cancer  Father   . Heart attack Father     Social History Social History  Substance Use Topics  . Smoking status: Former Smoker    Quit date: 12/22/2008  . Smokeless tobacco: Never Used  . Alcohol use No     Comment: distant history of cocaine abuse, not active     Allergies   Antihistamines, chlorpheniramine-type; Hydrocodone; and Sulfa antibiotics   Review of Systems Review of Systems  Constitutional: Negative for fever.  HENT: Negative for congestion.   Eyes: Negative for visual disturbance.  Respiratory: Positive for shortness of breath.   Cardiovascular: Positive for chest pain and leg swelling.  Gastrointestinal: Negative for abdominal pain and nausea.  Genitourinary: Negative for dysuria.  Musculoskeletal: Negative for back pain.  Skin: Positive for wound.  Neurological: Negative for headaches.  Hematological: Does not bruise/bleed easily.  Psychiatric/Behavioral: Negative for confusion.     Physical Exam Updated Vital Signs BP 109/66 (BP Location: Left Arm)   Pulse 77   Temp 98 F (36.7 C) (Oral)   Resp 20   LMP 11/03/2012   SpO2 100%   Physical Exam  Constitutional: She is oriented to person, place, and time. She appears well-developed and well-nourished. No distress.  HENT:  Head: Normocephalic and atraumatic.  Mouth/Throat: Oropharynx is clear and moist.  Eyes: Conjunctivae and EOM are normal. Pupils are equal, round, and reactive to light.  Neck: Normal range of motion. Neck supple.  Cardiovascular: Normal rate, regular rhythm and normal heart sounds.   Pulmonary/Chest: Effort normal and breath sounds normal. No respiratory distress.  Abdominal: Soft. Bowel sounds are normal. There is no tenderness.  Musculoskeletal: She exhibits edema.  Patient with dressing on right foot ankle following podiatry surgery. Swelling to that leg.  Neurological: She is alert and oriented to person, place, and time. No cranial nerve deficit or sensory deficit. She exhibits  normal muscle tone.  Skin: Skin is warm.  Nursing note and vitals reviewed.    ED Treatments / Results  Labs (all labs ordered are listed, but only abnormal results are displayed) Labs Reviewed  BASIC METABOLIC PANEL - Abnormal; Notable for the following:       Result Value   Potassium 2.7 (*)    Glucose, Bld 121 (*)    All other components within normal limits  CBC - Abnormal; Notable for the following:    Hemoglobin 11.7 (*)    HCT 35.2 (*)    All other components within normal limits  D-DIMER, QUANTITATIVE (NOT AT [facility]) - Abnormal; Notable for the following:    D-Dimer, Quant 1.43 (*)    All other components within normal limits  TROPONIN I  TROPONIN I  TROPONIN I  HEMOGLOBIN A1C  LIPID PANEL  I-STAT TROPOININ, ED    EKG  EKG Interpretation  Date/Time:  Sunday May 31 2017 11:10:46 EDT Ventricular Rate:  72 PR Interval:  176 QRS Duration: 96 QT Interval:  380 QTC Calculation: 416 R Axis:   1 Text Interpretation:  Normal sinus rhythm Normal ECG No significant change since last tracing Confirmed by Fredia Sorrow [phone]) on 05/31/2017 12:10:14 PM       Radiology Dg Chest 2 View  Result Date: 05/31/2017 CLINICAL DATA:  Chest pain. EXAM: CHEST  2 VIEW COMPARISON:  04/20/2017 and prior exams FINDINGS: The cardiomediastinal silhouette is unremarkable. There is no evidence of focal airspace disease, pulmonary edema, suspicious pulmonary nodule/mass, pleural effusion, or pneumothorax. No acute bony abnormalities are identified. Cervical spine surgical changes again noted. IMPRESSION: No active cardiopulmonary disease. Electronically Signed   By: Margarette Canada M.D.   On: 05/31/2017 12:16    Procedures Procedures (including critical care time)  CRITICAL CARE Performed by: Fredia Sorrow Total critical care time: 30 minutes Critical care time was exclusive of separately billable procedures and treating other patients. Critical care was necessary to treat or prevent  imminent or life-threatening deterioration. Critical care was time spent personally by me on the following activities: development of treatment plan with patient and/or surrogate as well as nursing, discussions with consultants, evaluation of patient's response to treatment, examination of patient, obtaining history from patient or surrogate, ordering and performing treatments and interventions, ordering and review of laboratory studies, ordering and review of radiographic studies, pulse oximetry and re-evaluation of patient's condition.   Medications Ordered in ED Medications  0.9 %  sodium chloride infusion ( Intravenous New Bag/Given 05/31/17 1327)  potassium chloride SA (K-DUR,KLOR-CON) CR tablet 40 mEq (40 mEq Oral Not Given 05/31/17 1645)  pantoprazole (PROTONIX) injection 40 mg (not administered)  gi cocktail (Maalox,Lidocaine,Donnatal) (not administered)  potassium chloride 10 mEq in 100 mL IVPB (0 mEq Intravenous Stopped 05/31/17 1641)  potassium chloride SA (K-DUR,KLOR-CON) CR tablet 40 mEq (40 mEq Oral Given 05/31/17 1648)     Initial Impression / Assessment and Plan / ED Course  I have reviewed the triage vital signs and the nursing notes.  Pertinent labs & imaging results that were available during my care of the patient were reviewed by me and considered in my medical decision making (see chart for details).     Patient with 2 complaints one is been intermittent chest pain that got worse today. The other is hypokalemia. For the hypokalemia patient was given 10 mEq potassium IV 2 and 40 mg oral potassium. Patient's potassium level was less than 3. Chest pain workup without any acute findings on the initial findings but patient certainly warrants chest pain rule out admission. Discussed with internal medicine team who will admit. In addition her d-dimer was elevated so CT angios was ordered and is still pending. Patient without hypoxia here. He certainly at risk for deep vein thrombosis  and possible PE though because she's had the podiatry surgery to the right leg recently. That was back in April.  Final Clinical Impressions(s) / ED Diagnoses   Final diagnoses:  Hypokalemia  Precordial pain    New Prescriptions New Prescriptions   No medications on file     Fredia Sorrow, MD 05/31/17 [id number]

## 2017-05-31 NOTE — ED Notes (Signed)
Patient transported to CT 

## 2017-05-31 NOTE — ED Notes (Signed)
Report attmepted x1

## 2017-05-31 NOTE — ED Triage Notes (Signed)
Pt sts mid sternal CP x 2 weeks worse over last couple of days

## 2017-05-31 NOTE — H&P (Signed)
Date: 05/31/2017               Patient Name:  Tasha [name] MRN: [id number]  DOB: [birth year] Age / Sex: 61 y.o., female   PCP: Nanci Pina, FNP         Medical Service: Internal Medicine Teaching Service         Attending Physician: Dr. Fredia Sorrow, MD    First Contact: Dr. Hetty Ely Pager: [phone]  Second Contact: Dr. 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 The pain began last weekend, all of this weeks it presented as jolts of pain that came and went from day to day. Last night the pain began just as she lay down, it is a throbbing sensation over the center of her chest (just over her lower sternum) which is reproducible with palpation. The pain last for 20 seconds at a time but remits between episodes, it has gradually gotten better on its own. Pain was associated with belching and does not change with eating and wasn't relieved with tums. The pain usually affects her most when she lays down and in the morning hours. She did have a heal spur removal in early April with revision in may and has been mostly sedentary since the surgery. Denies recent travel. She's never had a pain like this in the past and the pain is not associated with shortness of breath, nausea, diaphoresis, or palpitations.  She also notes that she was recently told she needed potassium supplementation from her PCP but completed the prescription one month ago and did not know whether she needed to get refills.  She notes that she lost a close friend suddenly the other day, he was found down in his home. She explains the pain became more concerning for  her after losing that friend.   Meds:  Current Facility-Administered Medications for the 05/31/17 encounter [facility] Encounter)  Medication  . betamethasone acetate-betamethasone sodium phosphate (CELESTONE) injection 3 mg  . betamethasone acetate-betamethasone sodium phosphate (CELESTONE) injection 3 mg   Current Meds  Medication Sig  . hydrochlorothiazide (HYDRODIURIL) 25 MG tablet Take 25 mg by mouth daily.   Marland Kitchen ibuprofen (ADVIL,MOTRIN) 800 MG tablet Take 800 mg by mouth every 8 (eight) hours as needed for moderate pain.   Marland Kitchen LIVALO 2 MG TABS Take 2 mg by mouth daily.     Allergies: Allergies as of 05/31/2017 - Review Complete 05/31/2017  Allergen Reaction Noted  . Antihistamines, chlorpheniramine-type Itching 04/17/2015  . Hydrocodone  04/30/2017  . Sulfa antibiotics Itching 12/19/2011   Past Medical History:  Diagnosis Date  . DDD (degenerative disc disease)    DJD.  spine.  chronic pain.   . Fibroids   . Hypertension   . Pyelonephritis 2004  . Vertigo 2014   positional.  Eval with Dr Jannifer Franklin, neuro, 2014.    Family History:  Family History  Problem Relation Age of Onset  . Hypertension Mother   . Cancer Mother   . Hyperlipidemia Mother   . Heart disease Father   . Cancer Father   . Heart attack Father    Social History:  She is a former smoker, quit smoking in 2010, she does not drink alcohol and is a recovering drug addict.   Review of Systems: A complete ROS was negative except as per HPI.   Physical Exam: Blood pressure 109/66, pulse 77, temperature 98 F (36.7 C), temperature source Oral, resp. rate 20, last menstrual period 11/03/2012, SpO2 100 %. Physical Exam  Constitutional: She is oriented to person, place, and time. She appears well-developed and well-nourished. No distress.  HENT:  Head: Normocephalic and atraumatic.  Eyes: Conjunctivae are normal. Right eye exhibits no discharge. Left eye exhibits no discharge. No scleral icterus.    Cardiovascular: Normal rate and regular rhythm.   No murmur heard. No peripheral edema  No calf swelling   Pulmonary/Chest: Effort normal and breath sounds normal. No respiratory distress. She has no wheezes. She has no rales. She exhibits tenderness.  Abdominal: Soft. Bowel sounds are normal. She exhibits no distension. There is no tenderness. There is no guarding.  Neurological: She is alert and oriented to person, place, and time.  Skin: Skin is warm and dry. She is not diaphoretic.  Psychiatric: She has a normal mood and affect. Her behavior is normal.   LABS  Na 139, K 2.7, bicarb 23, BUN 12, crt 0.76, glucose 121 WBC 4.7, hgb 11.7, plt 245 Troponin POC 0.00  D-dimer 1.43  EKG: NSR, left axis deviation, q wave in aVL unchanged from prior   CXR: personal review of the CXR reveals no acute cardiopulmonary disease   Assessment & Plan by Problem: Principal Problem:   Chest pain Active Problems:   Hypokalemia   Essential hypertension   Hyperlipidemia  Chest pain  She recently had a negative myoview stress test in 2017 but has multiple risk factors for CAD including hypertension, atherosclerotic disease, and tobacco use. HEART score 4 is moderate risk for ACS. Initial troponin is negative and EKG shows no acute changes. Hx of recent foot surgery and recovery are concerning for PE, but she has a normal O2 sat and is normocardic without complaints of leg pain or swelling. She had elevated D-dimer (1.43) and was sent for CTA in the ED. Wells for PE is 1.5 which is low risk. She does describe increased NSAID use recently related to her foot surgery which may have led to her developing gastritis.  -follow up lipid panel  - follow up A1c  - avoid NSAIDS  - ordered protonix  - ordered GI cocktail  - telemetry monitoring  - follow up morning EKG  -trend troponins   Hypokalemia  K 2.7 at presentation likely related to her taking HCTZ without K supplementation. S/p K-dur 40 meq PO and  IV 10 meq in the ED  - ordered additional 40 meq PO  - follow up BMP tomorrow morning   Hypertension  Currently normotensive.  - hold home med HCTZ 25 mg qd, can be restarted if she becomes hypertensive.   Hyperlipidemia  Home med is Livalo 2 mg qd.  -follow up lipid panel  - ordered pravastatin 40 mg qd   S/p recent heal spur removal  - ordered home med hydrocodone- acetaminophen 5-325 q8h PRN    Dispo: Admit patient to Observation with expected length of stay less than 2 midnights.  Signed: Ledell Noss, MD 05/31/2017, 5:30 PM  Pager: [phone]

## 2017-05-31 NOTE — ED Notes (Signed)
Iv team unable to obtain IV access. Dr Jeneen Rinks to try with ultrasound

## 2017-06-01 DIAGNOSIS — I10 Essential (primary) hypertension: Secondary | ICD-10-CM | POA: Diagnosis not present

## 2017-06-01 DIAGNOSIS — Z885 Allergy status to narcotic agent status: Secondary | ICD-10-CM

## 2017-06-01 DIAGNOSIS — E785 Hyperlipidemia, unspecified: Secondary | ICD-10-CM

## 2017-06-01 DIAGNOSIS — K2970 Gastritis, unspecified, without bleeding: Secondary | ICD-10-CM | POA: Diagnosis not present

## 2017-06-01 DIAGNOSIS — Z888 Allergy status to other drugs, medicaments and biological substances status: Secondary | ICD-10-CM

## 2017-06-01 DIAGNOSIS — Z882 Allergy status to sulfonamides status: Secondary | ICD-10-CM

## 2017-06-01 DIAGNOSIS — R079 Chest pain, unspecified: Secondary | ICD-10-CM

## 2017-06-01 DIAGNOSIS — E876 Hypokalemia: Secondary | ICD-10-CM

## 2017-06-01 DIAGNOSIS — R0789 Other chest pain: Secondary | ICD-10-CM | POA: Diagnosis not present

## 2017-06-01 LAB — RAPID URINE DRUG SCREEN, HOSP PERFORMED
AMPHETAMINES: NOT DETECTED
Barbiturates: NOT DETECTED
Benzodiazepines: NOT DETECTED
COCAINE: NOT DETECTED
OPIATES: NOT DETECTED
TETRAHYDROCANNABINOL: POSITIVE — AB

## 2017-06-01 LAB — BASIC METABOLIC PANEL
Anion gap: 7 (ref 5–15)
BUN: 13 mg/dL (ref 6–20)
CHLORIDE: 112 mmol/L — AB (ref 101–111)
CO2: 22 mmol/L (ref 22–32)
CREATININE: 0.78 mg/dL (ref 0.44–1.00)
Calcium: 9.2 mg/dL (ref 8.9–10.3)
GFR calc Af Amer: >60 mL/min (ref >60)
GFR calc non Af Amer: >60 mL/min (ref >60)
Glucose, Bld: 105 mg/dL — ABNORMAL HIGH (ref 65–99)
Potassium: 3.7 mmol/L (ref 3.5–5.1)
Sodium: 141 mmol/L (ref 135–145)

## 2017-06-01 LAB — LIPID PANEL
CHOL/HDL RATIO: 4.3 ratio
Cholesterol: 162 mg/dL (ref 0–200)
HDL: 38 mg/dL — ABNORMAL LOW (ref >40)
LDL Cholesterol: 96 mg/dL (ref 0–99)
Triglycerides: 140 mg/dL (ref <150)
VLDL: 28 mg/dL (ref 0–40)

## 2017-06-01 LAB — HEMOGLOBIN A1C
HEMOGLOBIN A1C: 5.7 % — AB (ref 4.8–5.6)
Mean Plasma Glucose: 117 mg/dL

## 2017-06-01 LAB — CBC
HCT: 31.2 % — ABNORMAL LOW (ref 36.0–46.0)
Hemoglobin: 10.1 g/dL — ABNORMAL LOW (ref 12.0–15.0)
MCH: 28.5 pg (ref 26.0–34.0)
MCHC: 32.4 g/dL (ref 30.0–36.0)
MCV: 88.1 fL (ref 78.0–100.0)
PLATELETS: 245 10*3/uL (ref 150–400)
RBC: 3.54 MIL/uL — ABNORMAL LOW (ref 3.87–5.11)
RDW: 15.2 % (ref 11.5–15.5)
WBC: 4.5 10*3/uL (ref 4.0–10.5)

## 2017-06-01 LAB — HIV ANTIBODY (ROUTINE TESTING W REFLEX): HIV Screen 4th Generation wRfx: NONREACTIVE

## 2017-06-01 LAB — TROPONIN I: TROPONIN I: 0.03 ng/mL — AB (ref <0.03)

## 2017-06-01 MED ORDER — PANTOPRAZOLE SODIUM 40 MG PO TBEC: 40.0000 mg | DELAYED_RELEASE_TABLET | Freq: Two times a day (BID) | ORAL | Status: DC

## 2017-06-01 MED ORDER — DICLOFENAC SODIUM 1 % TD GEL
2.0000 g | Freq: Four times a day (QID) | TRANSDERMAL | Status: DC | PRN
  Filled 2017-06-01: qty 100

## 2017-06-01 MED ORDER — PANTOPRAZOLE SODIUM 40 MG PO TBEC: 40.0000 mg | DELAYED_RELEASE_TABLET | Freq: Every day | ORAL | 0 refills | Status: DC

## 2017-06-01 MED ORDER — ASPIRIN EC 81 MG PO TBEC: 81.0000 mg | DELAYED_RELEASE_TABLET | Freq: Every day | ORAL | 0 refills | Status: AC

## 2017-06-01 MED ORDER — TRAMADOL HCL 50 MG PO TABS
50.0000 mg | ORAL_TABLET | Freq: Three times a day (TID) | ORAL | Status: DC | PRN
  Administered 2017-06-01 (×2): 50 mg via ORAL
  Filled 2017-06-01 (×2): qty 1

## 2017-06-01 MED ORDER — NITROGLYCERIN 0.4 MG SL SUBL
0.4000 mg | SUBLINGUAL_TABLET | SUBLINGUAL | Status: DC | PRN
  Filled 2017-06-01: qty 1

## 2017-06-01 MED ORDER — POTASSIUM CHLORIDE ER 20 MEQ PO TBCR: 20.0000 meq | EXTENDED_RELEASE_TABLET | Freq: Every day | ORAL | 0 refills | Status: DC

## 2017-06-01 NOTE — Discharge Instructions (Signed)
[name],  I'm glad your chest pain is feeling better.   We found that your cardiac markers (troponin) were slightly elevated, we need for you to follow up with the cardiologist Dr. Gwenlyn Found for this within the next two weeks.  START taking enteric coated (EC) aspirin until you have made it to that follow up appointment.    For the pain in your upper abdomen:  START taking Protonix 40 mg daily  STOP taking NSAID such as ibuprofen, naproxen, and alleve, these may have been related to the pain you're feeling   Your potassium was low when you came in to the hospital, this was most likely related to you running out of your potassium pills at home. Follow up with your primary care doctor to have your potassium rechecked and to have this medication refilled within the next two weeks.  START taking K-Dur 20 meq daily   If you have any questions or concerns about your hospitalization call the [facility] internal medicine center [phone]

## 2017-06-01 NOTE — Progress Notes (Signed)
Patient discharge teaching given, including activity, diet, follow-up appoints, and medications. Patient verbalized understanding of all discharge instructions. IV access was d/c'd. Vitals are stable. Skin is intact except as charted in most recent assessments. Pt to be escorted out by NT, to be driven home by family. 

## 2017-06-01 NOTE — Progress Notes (Signed)
Pt arrived floor. Was assessed to be AxOx4, Pt was oriented to the room. Cardiac telemetry was initiated and will continue to monitor.

## 2017-06-01 NOTE — Discharge Summary (Signed)
Name: Tasha [name] MRN: [id number] DOB: 30-Apr-1956 61 y.o. PCP: Nanci Pina, FNP  Date of Admission: 05/31/2017 11:09 AM Date of Discharge: 06/01/2017 Attending Physician: Lucious Groves, DO  Discharge Diagnosis: 1.    Chest pain:  Discharge Medications: Allergies as of 06/01/2017      Reactions   Antihistamines, Chlorpheniramine-type Itching   Hydrocodone    Rash, hives and itching.   Sulfa Antibiotics Itching      Medication List    STOP taking these medications   diclofenac 50 MG tablet Commonly known as:  CATAFLAM   HYDROcodone-acetaminophen 10-325 MG tablet Commonly known as:  NORCO   hydrOXYzine 10 MG tablet Commonly known as:  ATARAX/VISTARIL   ibuprofen 800 MG tablet Commonly known as:  ADVIL,MOTRIN   meclizine 25 MG tablet Commonly known as:  ANTIVERT     TAKE these medications   aspirin EC 81 MG tablet Take 1 tablet (81 mg total) by mouth daily.   hydrochlorothiazide 25 MG tablet Commonly known as:  HYDRODIURIL Take 25 mg by mouth daily.   LIVALO 2 MG Tabs Generic drug:  Pitavastatin Calcium Take 2 mg by mouth daily.   NONFORMULARY OR COMPOUNDED ITEM [facility]:  Achilles Tendonitis Cream - Diclofenac 3%, Baclofen 2%, Bupivacaine 1%, Gabapentin 6%, Ibuprofen 3%, Pentoxifylline 3%, apply 1-2 grams to affected area 3-4 times daily.   pantoprazole 40 MG tablet Commonly known as:  PROTONIX Take 1 tablet (40 mg total) by mouth daily.   Potassium Chloride ER 20 MEQ Tbcr Take 20 mEq by mouth daily.       Disposition and follow-up:   [name] was discharged from [facility] in Stable condition.  At the hospital follow up visit please address:  1.  Chest pain- she was asked to start aspirin and avoid NSAIDS. Trop peaked and plateaued at 0.03.   2.  Labs / imaging needed at time of follow-up: consider EKG  3.  Pending labs/ test needing follow-up: lipid panel   Follow-up Appointments:   Hospital Course by  problem list:    Chest pain Gastritis    Hyperlipidemia [name] presented with one week of epigastric pain which was worse with palpation of the area and associated with belching. She had been taking more ibuprofen since recent heel spur surgery and believed that that was related to the pain. The pain did worsen with exertion or relieve with rest and it wasn't associated with nausea, vomiting, or diaphoresis. She had a low risk stress test less than a year prior. EKG showed no acute ischemic changes, troponin peaked and plateaued at 0.03. D-dimer was ordered in the ED (1.43)CTA did not reveal a proximal PE. On the day following admission the chest pain had resolved. She was prescribed PPI and asked to avoid NSAIDs. She was already taking moderate intensity statin ( Livalo 2 mg ) and was started on daily EC coated aspirin. She was asked to follow up with Dr. Gwenlyn Found, her cardiologist.     Hypokalemia Responded appropriately to repletion.     Essential hypertension Normotensive at admission so home med HCTZ was held.   Discharge Vitals:   BP 127/64 (BP Location: Left Arm)   Pulse 75   Temp 98.1 F (36.7 C)   Resp 18   Ht 5\' 4"  (1.626 m)   Wt 219 lb 6.4 oz (99.5 kg)   LMP 11/03/2012   SpO2 95%   BMI 37.66 kg/m   Pertinent Labs, Studies, and Procedures:  Hemoglobin A1c 5.7   Discharge Instructions: Discharge Instructions    Call MD for:  difficulty breathing, headache or visual disturbances    Complete by:  As directed    Call MD for:  persistant nausea and vomiting    Complete by:  As directed    Call MD for:  severe uncontrolled pain    Complete by:  As directed    Diet - low sodium heart healthy    Complete by:  As directed    Increase activity slowly    Complete by:  As directed       Signed: Ledell Noss, MD 06/13/2017, 5:36 PM   Pager: [phone]

## 2017-06-01 NOTE — Progress Notes (Signed)
   Subjective: [name] reports that she had some chest pain overnight when she was laying on her side that resolved with readjusting in bed.   Objective:  Vital signs in last 24 hours: Vitals:   05/31/17 1700 05/31/17 2005 06/01/17 0506 06/01/17 0700  BP: 113/77 (!) 150/82 127/64   Pulse: 76 78 75   Resp:  18 18   Temp:  98.5 F (36.9 C) 98.1 F (36.7 C)   TempSrc:  Oral    SpO2: 99% 100% 95%   Weight:    219 lb 6.4 oz (99.5 kg)  Height:    5\' 4"  (1.626 m)   Physical Exam  Constitutional: She is oriented to person, place, and time and well-developed, well-nourished, and in no distress. No distress.  HENT:  Head: Normocephalic and atraumatic.  Eyes: Conjunctivae are normal. Right eye exhibits no discharge. Left eye exhibits no discharge. No scleral icterus.  Cardiovascular: Normal rate, regular rhythm and intact distal pulses.   No murmur heard. No peripheral edema   Pulmonary/Chest: Effort normal and breath sounds normal. No respiratory distress. She has no wheezes. She has no rales. She exhibits tenderness.  Abdominal: Soft. Bowel sounds are normal. She exhibits no distension. There is no tenderness. There is no guarding.  Neurological: She is alert and oriented to person, place, and time.  Skin: Skin is warm and dry. She is not diaphoretic.  Psychiatric: Affect and judgment normal.   Assessment/Plan:  Principal Problem:   Chest pain Active Problems:   Hypokalemia   Essential hypertension   Hyperlipidemia  Chest pain  Troponin peaked and plateaud at 0.03, follow up EKG today revealed no ACS. The elevation in her troponin is concerning for some underlying cardiac disease but I do not think that she is having an MI at this time. CTA did not reveal proximal PE. Chest pain is reproducible and associated with epigastric tenderness, most likely related to increased NSAID use in the setting of her recent foot surgery.  - encouraged close follow up with her cardiologist    -prescribed EC aspirin 81 mg daily  - prescribed protonix 40 mg daily at discharge  -follow up A1c after discharge  - encouraged avoidance of protonix   Hypokalemia  2/2 medication non compliance. Resolved with repletion.  - Will prescribe supplementation at the time of discharge.   Hypertension  - continue home med hctz at discharge   Hyperlipidemia  - continue home med livalo at discharge   S/p recent heal spur removal  [place] controlled substance database is discordant with UDS overnight. I do not feel comfortable prescribing her any new medications at discharge but she will follow up with her podiatrist for this.  - avoid NSAIDS    Dispo: Anticipated discharge today.   Ledell Noss, MD 06/01/2017, 1:10 PM Pager: [phone]

## 2017-06-05 ENCOUNTER — Ambulatory Visit (INDEPENDENT_AMBULATORY_CARE_PROVIDER_SITE_OTHER): Payer: BLUE CROSS/BLUE SHIELD | Admitting: Cardiovascular Disease

## 2017-06-05 ENCOUNTER — Encounter (HOSPITAL_COMMUNITY): Payer: Self-pay | Admitting: Family Medicine

## 2017-06-05 ENCOUNTER — Ambulatory Visit (HOSPITAL_COMMUNITY)
Admission: EM | Admit: 2017-06-05 | Discharge: 2017-06-05 | Disposition: A | Discharge status: Home / Self Care | Payer: BLUE CROSS/BLUE SHIELD | Attending: Internal Medicine | Admitting: Internal Medicine

## 2017-06-05 ENCOUNTER — Encounter: Payer: Self-pay | Admitting: Cardiovascular Disease

## 2017-06-05 VITALS — BP 112/67 | HR 83 | Ht 64.0 in | Wt 217.0 lb

## 2017-06-05 DIAGNOSIS — I10 Essential (primary) hypertension: Secondary | ICD-10-CM

## 2017-06-05 DIAGNOSIS — R002 Palpitations: Secondary | ICD-10-CM

## 2017-06-05 DIAGNOSIS — I200 Unstable angina: Secondary | ICD-10-CM

## 2017-06-05 DIAGNOSIS — I779 Disorder of arteries and arterioles, unspecified: Secondary | ICD-10-CM | POA: Diagnosis not present

## 2017-06-05 DIAGNOSIS — Z5189 Encounter for other specified aftercare: Secondary | ICD-10-CM | POA: Diagnosis not present

## 2017-06-05 DIAGNOSIS — I739 Peripheral vascular disease, unspecified: Principal | ICD-10-CM

## 2017-06-05 DIAGNOSIS — E7800 Pure hypercholesterolemia, unspecified: Secondary | ICD-10-CM | POA: Diagnosis not present

## 2017-06-05 NOTE — Patient Instructions (Signed)
Medication Instructions: Your physician recommends that you continue on your current medications as directed. Please refer to the Current Medication list given to you today.  Testing/Procedures: Your physician has requested that you have a carotid duplex. This test is an ultrasound of the carotid arteries in your neck. It looks at blood flow through these arteries that supply the brain with blood. Allow one hour for this exam. There are no restrictions or special instructions.  (Next month and yearly)  Follow-Up: Your physician wants you to follow-up in: 1 year with Dr. Veleta Miners carotid doppler. You will receive a reminder letter in the mail two months in advance. If you don't receive a letter, please call our office to schedule the follow-up appointment.  If you need a refill on your cardiac medications before your next appointment, please call your pharmacy.

## 2017-06-05 NOTE — Assessment & Plan Note (Signed)
History of hyperlipidemia on statin therapy with recent lipid profile performed 06/01/17 revealing total cholesterol 162, LDL 96 and HDL of 38.

## 2017-06-05 NOTE — Assessment & Plan Note (Signed)
History of palpitations in the past with an event monitor showed sinus rhythm/ sinus tach. Her palpitations have markedly improved.

## 2017-06-05 NOTE — Assessment & Plan Note (Signed)
History of atypical chest pain with a negative Myoview stress test performed 07/03/16. Since I saw her back on this year ago she's had no chest pain until a recent overnight admission for atypical chest pain earlier this month.

## 2017-06-05 NOTE — Progress Notes (Signed)
06/05/2017 Deneane Stifter   24-Aug-1956  [id number]  Primary Physician Nanci Pina, FNP Primary Cardiologist: Lorretta Harp MD Renae Gloss  HPI:  Mrs. Truman Hayward is a 61 year old mildly overweight single African-American female mother of one child referred by Josefine Class for cardiovascular evaluation because of known carotid disease and atypical chest pain. I last saw her in the office 07/23/16. Her only risk factor is hypertension and hyperlipidemia. She has never had a heart attack or stroke. 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 She also occasionally gets chest pain weekly basis for somewhat atypical as well as palpitations. A subsequent Myoview stress test performed 07/03/16 was entirely normal and an event monitor performed 06/17/16 revealed sinus rhythm/sinus tachycardia. She was admitted to the hospital with atypical chest pain on 05/31/17 and ruled out for myocardial infarction.   Current Outpatient Prescriptions  Medication Sig Dispense Refill  . aspirin EC 81 MG tablet Take 1 tablet (81 mg total) by mouth daily. 30 tablet 0  . hydrochlorothiazide (HYDRODIURIL) 25 MG tablet Take 25 mg by mouth daily.   0  . LIVALO 2 MG TABS Take 2 mg by mouth daily.  1  . NONFORMULARY OR COMPOUNDED ITEM [facility]:  Achilles Tendonitis Cream - Diclofenac 3%, Baclofen 2%, Bupivacaine 1%, Gabapentin 6%, Ibuprofen 3%, Pentoxifylline 3%, apply 1-2 grams to affected area 3-4 times daily. 120 each 3  . pantoprazole (PROTONIX) 40 MG tablet Take 1 tablet (40 mg total) by mouth daily. 30 tablet 0  . potassium chloride 20 MEQ TBCR Take 20 mEq by mouth daily. 30 tablet 0   No current facility-administered medications for this visit.     Allergies  Allergen Reactions  . Antihistamines, Chlorpheniramine-Type Itching  . Hydrocodone     Rash, hives and itching.  . Sulfa Antibiotics Itching      Social History   Social History  . Marital status: Single    Spouse name: N/A  . Number of children: 1  . Years of education: college   Occupational History  . [employer]   Social History Main Topics  . Smoking status: Former Smoker    Quit date: 12/22/2008  . Smokeless tobacco: Never Used  . 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 Drug use: Yes    Frequency: 2.0 times per week    Types: Marijuana     Comment: recovering drug addict  . Sexual activity: Not Currently    Partners: Male    Birth control/ protection: None   Other Topics Concern  . Not on file   Social History Narrative  . No narrative on file     Review of Systems: General: negative for chills, fever, night sweats or weight changes.  Cardiovascular: negative for chest pain, dyspnea on exertion, edema, orthopnea, palpitations, paroxysmal nocturnal dyspnea or shortness of breath Dermatological: negative for rash Respiratory: negative for cough or wheezing Urologic: negative for hematuria Abdominal: negative for nausea, vomiting, diarrhea, bright red blood per rectum, melena, or hematemesis Neurologic: negative for visual changes, syncope, or dizziness All other systems reviewed and are otherwise negative except as noted above.    Blood pressure 112/67, pulse 83, last menstrual period 11/03/2012, SpO2 99 %.  General appearance: alert and no distress Neck: no adenopathy, no carotid bruit, no JVD, supple, symmetrical, trachea midline and  thyroid not enlarged, symmetric, no tenderness/mass/nodules Lungs: clear to auscultation bilaterally Heart: regular rate and rhythm, S1, S2 normal, no murmur, click, rub or gallop Extremities: extremities normal, atraumatic, no cyanosis or edema  EKG not performed today  ASSESSMENT AND PLAN:   Left-sided carotid artery disease (HCC) History of carotid artery disease with known occluded left internal carotid artery by  duplex ultrasound 07/03/16 with a widely patent right internal carotid artery. We'll continue to follow this on an annual basis.  Essential hypertension History of essential hypertension blood pressure measured at 112/67. She is on hydrochlorothiazide. Continue current meds at current dosing  Hyperlipidemia History of hyperlipidemia on statin therapy with recent lipid profile performed 06/01/17 revealing total cholesterol 162, LDL 96 and HDL of 38.  Chest pain History of atypical chest pain with a negative Myoview stress test performed 07/03/16. Since I saw her back on this year ago she's had no chest pain until a recent overnight admission for atypical chest pain earlier this month.  Palpitations History of palpitations in the past with an event monitor showed sinus rhythm/ sinus tach. Her palpitations have markedly improved.      Lorretta Harp MD FACP,FACC,FAHA, [facility] 06/05/2017 11:19 AM

## 2017-06-05 NOTE — Assessment & Plan Note (Signed)
History of essential hypertension blood pressure measured at 112/67. She is on hydrochlorothiazide. Continue current meds at current dosing

## 2017-06-05 NOTE — ED Notes (Signed)
Cleaned pt surgery wound with iodine as asked. Replaced dressing with iodine and bacitracin and non stick gauze. Re wrapped foot. Wound looked well healed. No redness, draining or swelling. Pt happy with dressing change.

## 2017-06-05 NOTE — ED Triage Notes (Signed)
Pt here for wound check and re wrapping of the right foot.

## 2017-06-05 NOTE — ED Provider Notes (Signed)
CSN: [id number]     Arrival date & time 06/05/17  1809 History   First MD Initiated Contact with Patient 06/05/17 1839     Chief Complaint  Patient presents with  . Wound Check   (Consider location/radiation/quality/duration/timing/severity/associated sxs/prior Treatment) Patient has had some surgery on her right heel/ankle and she is wanting wound check.  She is scheduled to have a follow up next week for suture removal.   The history is provided by the patient.  Wound Check  This is a new problem. The problem occurs constantly. The problem has not changed since onset.Nothing aggravates the symptoms.    Past Medical History:  Diagnosis Date  . DDD (degenerative disc disease)    DJD.  spine.  chronic pain.   . Fibroids   . Hypertension   . Pyelonephritis 2004  . Vertigo 2014   positional.  Eval with Dr Jannifer Franklin, neuro, 2014.    Past Surgical History:  Procedure Laterality Date  . CESAREAN SECTION    . CHOLECYSTECTOMY  2001  . ERCP W/ SPHICTEROTOMY  2001   removal CBD stones  . NECK SURGERY     cadaver bones put in her neck  . 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    Family History  Problem Relation Age of Onset  . Hypertension Mother   . Cancer Mother   . Hyperlipidemia Mother   . Heart disease Father   . Cancer Father   . Heart attack Father    Social History  Substance Use Topics  . Smoking status: Former Smoker    Quit date: 12/22/2008  . Smokeless tobacco: Never Used  . Alcohol use No     Comment: distant history of cocaine abuse, not active   OB History    Gravida Para Term Preterm AB Living   4 1 1   3 1    SAB TAB Ectopic Multiple Live Births   2       1     Review of Systems  Constitutional: Negative.   HENT: Negative.   Eyes: Negative.   Respiratory: Negative.   Cardiovascular: Negative.   Gastrointestinal: Negative.   Endocrine: Negative.   Genitourinary: Negative.    Musculoskeletal: Negative.   Allergic/Immunologic: Negative.   Neurological: Negative.   Hematological: Negative.   Psychiatric/Behavioral: Negative.     Allergies  Antihistamines, chlorpheniramine-type; Hydrocodone; and Sulfa antibiotics  Home Medications   Prior to Admission medications   Medication Sig Start Date End Date Taking? Authorizing Provider  aspirin EC 81 MG tablet Take 1 tablet (81 mg total) by mouth daily. 06/01/17   Ledell Noss, MD  hydrochlorothiazide (HYDRODIURIL) 25 MG tablet Take 25 mg by mouth daily.  09/07/14   [provider]  LIVALO 2 MG TABS Take 2 mg by mouth daily. 05/30/17   [provider]  NONFORMULARY OR COMPOUNDED ITEM [facility]:  Achilles Tendonitis Cream - Diclofenac 3%, Baclofen 2%, Bupivacaine 1%, Gabapentin 6%, Ibuprofen 3%, Pentoxifylline 3%, apply 1-2 grams to affected area 3-4 times daily. 08/12/16   Wallene Huh, DPM  pantoprazole (PROTONIX) 40 MG tablet Take 1 tablet (40 mg total) by mouth daily. 06/01/17   Ledell Noss, MD  potassium chloride 20 MEQ TBCR Take 20 mEq by mouth daily. 06/01/17   Ledell Noss, MD   Meds Ordered and Administered this Visit  Medications - No data to display  BP (!) 114/95   Pulse 83   Temp 98.5  F (36.9 C)   Resp 18   LMP 11/03/2012   SpO2 98%  No data found.   Physical Exam  Constitutional: She appears well-developed and well-nourished.  HENT:  Head: Normocephalic and atraumatic.  Eyes: Conjunctivae and EOM are normal. Pupils are equal, round, and reactive to light.  Neck: Normal range of motion. Neck supple.  Cardiovascular: Normal rate, regular rhythm and normal heart sounds.   Pulmonary/Chest: Effort normal and breath sounds normal.  Skin:  Staples right leg and ankle along achilles and posterior foot.  Incision is well approximated and wound looks well healed w/o cellulitis.  Nursing note and vitals reviewed.   Urgent Care Course     Procedures (including critical care  time)  Labs Review Labs Reviewed - No data to display  Imaging Review No results found.   Visual Acuity Review  Right Eye Distance:   Left Eye Distance:   Bilateral Distance:    Right Eye Near:   Left Eye Near:    Bilateral Near:         MDM   1. Visit for wound check    Follow up with surgeon this week as scheduled and will re wrap foot and reassurance given.      Lysbeth Penner, FNP 06/05/17 1901

## 2017-06-05 NOTE — Assessment & Plan Note (Signed)
History of carotid artery disease with known occluded left internal carotid artery by duplex ultrasound 07/03/16 with a widely patent right internal carotid artery. We'll continue to follow this on an annual basis.

## 2017-06-10 ENCOUNTER — Encounter: Payer: Self-pay | Admitting: Podiatry

## 2017-06-10 ENCOUNTER — Ambulatory Visit (INDEPENDENT_AMBULATORY_CARE_PROVIDER_SITE_OTHER): Payer: BLUE CROSS/BLUE SHIELD | Admitting: Podiatry

## 2017-06-10 DIAGNOSIS — Z9889 Other specified postprocedural states: Secondary | ICD-10-CM

## 2017-06-10 MED ORDER — HYDROCODONE-ACETAMINOPHEN 5-325 MG PO TABS: 1.0000 | ORAL_TABLET | Freq: Four times a day (QID) | ORAL | 0 refills | Status: DC | PRN

## 2017-06-14 NOTE — Progress Notes (Signed)
   Subjective:  Patient presents today status post revision repair of right Achilles. DOS: 05/01/17. She reports continued throbbing pain. She states she went to the ED on 06/05/17 because she got her foot wet.   Objective/Physical Exam Skin incisions appear to be well coapted with sutures and staples intact. No sign of infectious process noted. No dehiscence. No active bleeding noted. Moderate edema noted to the surgical extremity. Pain is improved of the last 2 weeks.   Assessment: 1. s/p revision repair of right Achilles DOS: 05/01/17   Plan of Care:  1. Patient was evaluated.  2. Sutures removed. 3. Daily antibiotic ointment and large Band-Aid. 4. Continue wearing cam boot. 5. Return to clinic in 2 weeks.   Edrick Kins, DPM [facility]  Dr. Edrick Kins, [address]                                        [place], [place] [address]                Office [phone]  Fax [phone]

## 2017-06-22 ENCOUNTER — Ambulatory Visit (INDEPENDENT_AMBULATORY_CARE_PROVIDER_SITE_OTHER): Payer: BLUE CROSS/BLUE SHIELD | Admitting: Podiatry

## 2017-06-22 ENCOUNTER — Encounter: Payer: Self-pay | Admitting: Podiatry

## 2017-06-22 DIAGNOSIS — Z9889 Other specified postprocedural states: Secondary | ICD-10-CM | POA: Diagnosis not present

## 2017-06-22 MED ORDER — HYDROCODONE-ACETAMINOPHEN 5-325 MG PO TABS: 1.0000 | ORAL_TABLET | Freq: Four times a day (QID) | ORAL | 0 refills | Status: DC | PRN

## 2017-06-24 NOTE — Progress Notes (Signed)
   Subjective:  Patient presents today status post revision repair of right Achilles. DOS: 05/01/17. She states she is doing better. She has no new complaints at this time.   Objective/Physical Exam Skin incisions appear to be well coapted. No sign of infectious process noted. No dehiscence. No active bleeding noted. Moderate edema noted to the surgical extremity. Pain is improved of the last 2 weeks.   Assessment: 1. s/p revision repair of right Achilles DOS: 05/01/17   Plan of Care:  1. Patient was evaluated.  2. Post op shoe dispensed. Discontinue wearing CAM boot. 3. Begin showering with shower chair. 4. Return to clinic in 2 weeks.   Edrick Kins, DPM [facility]  Dr. Edrick Kins, [address]                                        [place], [place] [address]                Office [phone]  Fax [phone]

## 2017-06-29 ENCOUNTER — Other Ambulatory Visit: Payer: Self-pay | Admitting: Internal Medicine

## 2017-07-06 ENCOUNTER — Ambulatory Visit (HOSPITAL_COMMUNITY)
Admission: RE | Admit: 2017-07-06 | Discharge: 2017-07-06 | Disposition: A | Discharge status: Home / Self Care | Payer: BLUE CROSS/BLUE SHIELD | Source: Ambulatory Visit | Attending: Cardiology | Admitting: Cardiology

## 2017-07-06 DIAGNOSIS — I779 Disorder of arteries and arterioles, unspecified: Secondary | ICD-10-CM | POA: Diagnosis not present

## 2017-07-06 DIAGNOSIS — I739 Peripheral vascular disease, unspecified: Secondary | ICD-10-CM

## 2017-07-06 DIAGNOSIS — I6523 Occlusion and stenosis of bilateral carotid arteries: Secondary | ICD-10-CM | POA: Diagnosis not present

## 2017-07-08 ENCOUNTER — Ambulatory Visit (INDEPENDENT_AMBULATORY_CARE_PROVIDER_SITE_OTHER): Payer: BLUE CROSS/BLUE SHIELD

## 2017-07-08 ENCOUNTER — Other Ambulatory Visit: Payer: Self-pay

## 2017-07-08 ENCOUNTER — Ambulatory Visit (INDEPENDENT_AMBULATORY_CARE_PROVIDER_SITE_OTHER): Payer: Self-pay | Admitting: Podiatry

## 2017-07-08 DIAGNOSIS — Z9889 Other specified postprocedural states: Secondary | ICD-10-CM | POA: Diagnosis not present

## 2017-07-08 MED ORDER — HYDROCODONE-ACETAMINOPHEN 5-325 MG PO TABS: 1.0000 | ORAL_TABLET | Freq: Four times a day (QID) | ORAL | 0 refills | Status: DC | PRN

## 2017-07-08 NOTE — Progress Notes (Signed)
   Subjective:  Patient presents today status post revision repair of right Achilles. DOS: 05/01/17. She states she is doing better. She has no new complaints at this time. She still complains of mild soreness intermittently to the posterior aspect of the right Achilles. Patient presents today for follow-up treatment and evaluation   Objective/Physical Exam No sign of infectious process noted. No dehiscence. No active bleeding noted. Minimal edema noted to the surgical extremity. Pain is improved of the last 2 weeks.   Assessment: 1. s/p revision repair of right Achilles DOS: 05/01/17   Plan of Care:  1. Patient was evaluated.  2. The patient can begin wearing regular supportive shoe gear. Discontinue postoperative shoe 3. Refill prescription for Percocet 5/325mg  4. Recommend daily antibiotic ointment and Band-Aid to the posterior aspect of the right heel 5. Return to clinic in 4 weeks   Edrick Kins, DPM [facility]  Dr. Edrick Kins, [address]                                        [place], [place] [address]                Office [phone]  Fax [phone]

## 2017-07-29 ENCOUNTER — Other Ambulatory Visit: Payer: Self-pay | Admitting: Podiatry

## 2017-07-29 ENCOUNTER — Ambulatory Visit (INDEPENDENT_AMBULATORY_CARE_PROVIDER_SITE_OTHER): Payer: Self-pay | Admitting: Podiatry

## 2017-07-29 DIAGNOSIS — Z9889 Other specified postprocedural states: Secondary | ICD-10-CM

## 2017-07-29 MED ORDER — HYDROCODONE-ACETAMINOPHEN 5-325 MG PO TABS: 1.0000 | ORAL_TABLET | Freq: Four times a day (QID) | ORAL | 0 refills | Status: DC | PRN

## 2017-07-30 MED ORDER — NONFORMULARY OR COMPOUNDED ITEM: 0 refills | Status: DC

## 2017-08-04 NOTE — Progress Notes (Signed)
   Subjective:  Patient presents today status post revision repair of right Achilles. DOS: 05/01/17.   The patient is dong much better.  Patient states that she has occasional spasms in her leg however she only has pain when anything touches the incision site due to hypersensitivity. Patient is planning on returning to work tomorrow full duty no restrictions. Patient otherwise is doing extremely well.   Objective/Physical Exam Incision site is completely coapted and healed. There is some dry callus tissue noted overlying the surgical incision site to the posterior heel. No open wounds noted. Hypersensitivity also noted to the incision site with palpation. Patient denies any significant pain on palpation with range of motion within normal limits and muscle strength 5/5 in all muscle groups right lower extremity.   Assessment: 1. s/p revision repair of right Achilles DOS: 05/01/17   Plan of Care:  1. Patient was evaluated.  2. Continue wearing good supportive shoes. 3. Prescription for anti-inflammatory pain cream through [facility] to address low-grade chronic achiness 4. Refill prescription for Percocet 5/325 mg when necessary pain 5. Silicone Achilles tendinitis lesions were dispensed to offload pressure from the surgical incision site. 6. Return to clinic when necessary   Edrick Kins, DPM [facility]  Dr. Edrick Kins, [address]                                        [place], [place] [address]                Office [phone]  Fax [phone]

## 2017-08-05 ENCOUNTER — Ambulatory Visit: Payer: BLUE CROSS/BLUE SHIELD | Admitting: Podiatry

## 2017-08-10 ENCOUNTER — Telehealth: Payer: Self-pay

## 2017-08-10 NOTE — Telephone Encounter (Signed)
Patient has not been to office in last 3 yrs. She wants to know what is the 'bump on her vagina', she was advised to make appt. This is scheduled for 08/26/17

## 2017-08-11 ENCOUNTER — Ambulatory Visit: Payer: BLUE CROSS/BLUE SHIELD | Admitting: Obstetrics & Gynecology

## 2017-08-25 ENCOUNTER — Ambulatory Visit: Payer: BLUE CROSS/BLUE SHIELD | Admitting: Obstetrics & Gynecology

## 2017-08-26 ENCOUNTER — Encounter: Payer: Self-pay | Admitting: Podiatry

## 2017-09-01 ENCOUNTER — Telehealth: Payer: Self-pay | Admitting: *Deleted

## 2017-09-01 NOTE — Telephone Encounter (Signed)
Oncology Nurse Navigator Documentation  Returned [name] call from earlier this afternoon.  She reported new onset 2-3 weeks ago of headaches "back of my head", dizziness, feeling faint. She indicated she is staying hydrated, confirmed voids frequently, urine is pale yellow.  I indicated most appropriate to contact PCP to address symptoms, she voiced understanding.  She further stated she was unable to keep appts in April for CT Neck and follow-up with Dr. Isidore Moos d/t foot surgery, has not received call re rescheduling.  I stated I would follow up on her behalf.  [name], RN, BSN, [place] [facility] On[facility] Nurse [place] [facility] [place] [phone]

## 2017-09-02 ENCOUNTER — Other Ambulatory Visit: Payer: Self-pay | Admitting: Radiation Oncology

## 2017-09-02 DIAGNOSIS — D356 Benign neoplasm of aortic body and other paraganglia: Secondary | ICD-10-CM

## 2017-09-03 NOTE — Telephone Encounter (Signed)
Susanne Borders. :)

## 2017-09-09 ENCOUNTER — Ambulatory Visit (HOSPITAL_COMMUNITY): Admission: RE | Admit: 2017-09-09 | Payer: BLUE CROSS/BLUE SHIELD | Source: Ambulatory Visit

## 2017-09-09 ENCOUNTER — Ambulatory Visit: Payer: BLUE CROSS/BLUE SHIELD

## 2017-09-10 ENCOUNTER — Ambulatory Visit (INDEPENDENT_AMBULATORY_CARE_PROVIDER_SITE_OTHER): Payer: BLUE CROSS/BLUE SHIELD | Admitting: Obstetrics

## 2017-09-10 ENCOUNTER — Encounter: Payer: Self-pay | Admitting: Obstetrics

## 2017-09-10 VITALS — BP 126/79 | HR 75 | Wt 212.6 lb

## 2017-09-10 DIAGNOSIS — Z1151 Encounter for screening for human papillomavirus (HPV): Secondary | ICD-10-CM | POA: Diagnosis not present

## 2017-09-10 DIAGNOSIS — Z124 Encounter for screening for malignant neoplasm of cervix: Secondary | ICD-10-CM

## 2017-09-10 DIAGNOSIS — Z1211 Encounter for screening for malignant neoplasm of colon: Secondary | ICD-10-CM

## 2017-09-10 DIAGNOSIS — Z113 Encounter for screening for infections with a predominantly sexual mode of transmission: Secondary | ICD-10-CM | POA: Diagnosis not present

## 2017-09-10 DIAGNOSIS — N898 Other specified noninflammatory disorders of vagina: Secondary | ICD-10-CM | POA: Diagnosis not present

## 2017-09-10 DIAGNOSIS — E2839 Other primary ovarian failure: Secondary | ICD-10-CM

## 2017-09-10 DIAGNOSIS — Z01419 Encounter for gynecological examination (general) (routine) without abnormal findings: Secondary | ICD-10-CM | POA: Diagnosis not present

## 2017-09-10 NOTE — Progress Notes (Signed)
Subjective:        Tasha [name] is a 61 y.o. female here for a routine exam.  Current complaints: Vaginal irritation.    Personal health questionnaire:  Is patient Ashkenazi Jewish, have a family history of breast and/or ovarian cancer: no Is there a family history of uterine cancer diagnosed at age < 85, gastrointestinal cancer, urinary tract cancer, family member who is a Field seismologist syndrome-associated carrier: no Is the patient overweight and hypertensive, family history of diabetes, personal history of gestational diabetes, preeclampsia or PCOS: no Is patient over 64, have PCOS,  family history of premature CHD under age 49, diabetes, smoke, have hypertension or peripheral artery disease:  no At any time, has a partner hit, kicked or otherwise hurt or frightened you?: no Over the past 2 weeks, have you felt down, depressed or hopeless?: no Over the past 2 weeks, have you felt little interest or pleasure in doing things?:no   Gynecologic History Patient's last menstrual period was 11/03/2012. Contraception: post menopausal status Last Pap: 2014. Results were: normal Last mammogram: 2018. Results were: normal  Obstetric History OB History  Gravida Para Term Preterm AB Living  4 1 1   3 1   SAB TAB Ectopic Multiple Live Births  2       1    # Outcome Date GA Lbr Len/2nd Weight Sex Delivery Anes PTL Lv  4 Term 05/28/77 [redacted]w[redacted]d  6 lb 6 oz (2.892 kg) F CS-Unspec Spinal  LIV  3 AB           2 SAB           1 SAB               Past Medical History:  Diagnosis Date  . DDD (degenerative disc disease)    DJD.  spine.  chronic pain.   . Fibroids   . Hypertension   . Pyelonephritis 2004  . Vertigo 2014   positional.  Eval with Dr Jannifer Franklin, neuro, 2014.     Past Surgical History:  Procedure Laterality Date  . CESAREAN SECTION    . CHOLECYSTECTOMY  2001  . ERCP W/ SPHICTEROTOMY  2001   removal CBD stones  . NECK SURGERY     cadaver bones put in her neck  . SKIN BIOPSY  12/2009,  0/9983   3825 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      Current Outpatient Prescriptions:  .  aspirin EC 81 MG tablet, Take 1 tablet (81 mg total) by mouth daily., Disp: 30 tablet, Rfl: 0 .  hydrochlorothiazide (HYDRODIURIL) 25 MG tablet, Take 25 mg by mouth daily. , Disp: , Rfl: 0 .  LIVALO 2 MG TABS, Take 2 mg by mouth daily., Disp: , Rfl: 1 .  potassium chloride 20 MEQ TBCR, Take 20 mEq by mouth daily., Disp: 30 tablet, Rfl: 0 .  HYDROcodone-acetaminophen (NORCO/VICODIN) 5-325 MG tablet, Take 1 tablet by mouth every 6 (six) hours as needed for moderate pain., Disp: 30 tablet, Rfl: 0 .  NONFORMULARY OR COMPOUNDED ITEM, [facility]:  Achilles Tendonitis Cream - Diclofenac 3%, Baclofen 2%, Bupivacaine 1%, Gabapentin 6%, Ibuprofen 3%, Pentoxifylline 3%, apply 1-2 grams to affected area 3-4 times daily. (Patient not taking: Reported on 09/10/2017), Disp: 120 each, Rfl: 3 .  NONFORMULARY OR COMPOUNDED ITEM, [facility]  Achilles Tendonitis Cream- Diclofenac 3%, Baclofen 2%, Bupivacaine 1%, Doxepin 5%, Gabapentin 6%, Ibuprofen 3%, Pentoxifylline 3% Apply 1-2 grams to affected area 3-4 times daily Qty. [place] King  gm 3 refills (Patient not taking: Reported on 09/10/2017), Disp: 1 each, Rfl: 0 .  pantoprazole (PROTONIX) 40 MG tablet, Take 1 tablet (40 mg total) by mouth daily. (Patient not taking: Reported on 09/10/2017), Disp: 30 tablet, Rfl: 0 Allergies  Allergen Reactions  . Antihistamines, Chlorpheniramine-Type Itching  . Hydrocodone     Rash, hives and itching.  . Sulfa Antibiotics Itching    Social History  Substance Use Topics  . Smoking status: Former Smoker    Quit date: 12/22/2008  . Smokeless tobacco: Never Used  . Alcohol use No     Comment: distant history of cocaine abuse, not active    Family History  Problem Relation Age of Onset  . Hypertension Mother   . Cancer Mother   . Hyperlipidemia Mother   . Heart disease Father   . Cancer Father    . Heart attack Father       Review of Systems  Constitutional: negative for fatigue and weight loss Respiratory: negative for cough and wheezing Cardiovascular: negative for chest pain, fatigue and palpitations Gastrointestinal: negative for abdominal pain and change in bowel habits Musculoskeletal:negative for myalgias Neurological: negative for gait problems and tremors Behavioral/Psych: negative for abusive relationship, depression Endocrine: negative for temperature intolerance    Genitourinary:negative for abnormal menstrual periods, genital lesions, hot flashes, sexual problems and vaginal discharge Integument/breast: negative for breast lump, breast tenderness, nipple discharge and skin lesion(s)    Objective:       BP 126/79   Pulse 75   Wt 212 lb 9.6 oz (96.4 kg)   LMP 11/03/2012   BMI 36.49 kg/m  General:   alert  Skin:   no rash or abnormalities  Lungs:   clear to auscultation bilaterally  Heart:   regular rate and rhythm, S1, S2 normal, no murmur, click, rub or gallop  Breasts:   normal without suspicious masses, skin or nipple changes or axillary nodes  Abdomen:  normal findings: no organomegaly, soft, non-tender and no hernia  Pelvis:  External genitalia: normal general appearance Urinary system: urethral meatus normal and bladder without fullness, nontender Vaginal: normal without tenderness, induration or masses Cervix: normal appearance Adnexa: normal bimanual exam Uterus: anteverted and non-tender, normal size   Lab Review Urine pregnancy test Labs reviewed yes Radiologic studies reviewed yes  50% of 20 min visit spent on counseling and coordination of care.    Assessment and Plan:     1. Encounter for routine gynecological examination with Papanicolaou smear of cervix Rx: - Cytology - PAP  2. Vaginal irritation Rx: - Cervicovaginal ancillary only  3. Screening for colon cancer Rx: - Ambulatory referral to Gastroenterology  4.  Hypoestrogenism due to postmenopausal status Rx; - DG BONE DENSITY (DXA); Future   Plan:    Education reviewed: calcium supplements, depression evaluation, low fat, low cholesterol diet, safe sex/STD prevention, self breast exams and weight bearing exercise. Follow up in: 3 years.   No orders of the defined types were placed in this encounter.  No orders of the defined types were placed in this encounter.

## 2017-09-10 NOTE — Progress Notes (Signed)
Patient is in the office for initial GYN visit. Pt complains of vaginal itching.

## 2017-09-14 ENCOUNTER — Ambulatory Visit (HOSPITAL_COMMUNITY)
Admission: RE | Admit: 2017-09-14 | Discharge: 2017-09-14 | Disposition: A | Discharge status: Home / Self Care | Payer: BLUE CROSS/BLUE SHIELD | Source: Ambulatory Visit | Attending: Radiation Oncology | Admitting: Radiation Oncology

## 2017-09-14 ENCOUNTER — Encounter (HOSPITAL_COMMUNITY): Payer: Self-pay

## 2017-09-14 ENCOUNTER — Ambulatory Visit
Admission: RE | Admit: 2017-09-14 | Discharge: 2017-09-14 | Disposition: A | Discharge status: Home / Self Care | Payer: BLUE CROSS/BLUE SHIELD | Source: Ambulatory Visit | Attending: Radiation Oncology | Admitting: Radiation Oncology

## 2017-09-14 DIAGNOSIS — D356 Benign neoplasm of aortic body and other paraganglia: Secondary | ICD-10-CM

## 2017-09-14 LAB — CERVICOVAGINAL ANCILLARY ONLY
Bacterial vaginitis: NEGATIVE
Candida vaginitis: NEGATIVE
Trichomonas: NEGATIVE

## 2017-09-14 LAB — BUN AND CREATININE (CC13)
BUN: 29.8 mg/dL — ABNORMAL HIGH (ref 7.0–26.0)
Creatinine: 1.4 mg/dL — ABNORMAL HIGH (ref 0.6–1.1)
EGFR: 47 mL/min/{1.73_m2} — AB (ref >90)

## 2017-09-14 MED ORDER — IOPAMIDOL (ISOVUE-300) INJECTION 61%
INTRAVENOUS | Status: AC
  Filled 2017-09-14: qty 75

## 2017-09-14 MED ORDER — IOPAMIDOL (ISOVUE-300) INJECTION 61%: 75.0000 mL | Freq: Once | INTRAVENOUS | Status: DC | PRN

## 2017-09-14 NOTE — Progress Notes (Signed)
Patient wanted IV team to stick her, didn't want anyone in CT to try. After 1 hour, IV team finally arrived to stick patient, but was unsuccessful x 2 sticks. Patient has number to reschedule her scan. Will try to get it on 9/28. Patient states she is going to stop her Lasix until after scan because she is dehydrated. She will call and inform her doctor of this as well. (This was patient's idea to stop the Lasix, no one in the hospital recommended this.)  Patient had been upset the entire time she was here, but left with a happier disposition because she is dehydrated and believes that not taking her Lasix will stop her from further dehydration. She was thanking me for being so patient and told me to thank the curly haired lady Tasha [name]) for her as well.

## 2017-09-15 ENCOUNTER — Encounter: Payer: Self-pay | Admitting: Gastroenterology

## 2017-09-15 LAB — CYTOLOGY - PAP
ADEQUACY: ABSENT
Diagnosis: NEGATIVE
HPV (WINDOPATH): NOT DETECTED

## 2017-09-21 ENCOUNTER — Ambulatory Visit (HOSPITAL_COMMUNITY)
Admission: RE | Admit: 2017-09-21 | Discharge: 2017-09-21 | Disposition: A | Discharge status: Home / Self Care | Payer: BLUE CROSS/BLUE SHIELD | Source: Ambulatory Visit | Attending: Radiation Oncology | Admitting: Radiation Oncology

## 2017-09-21 ENCOUNTER — Encounter (HOSPITAL_COMMUNITY): Payer: Self-pay

## 2017-09-21 DIAGNOSIS — D356 Benign neoplasm of aortic body and other paraganglia: Secondary | ICD-10-CM | POA: Diagnosis not present

## 2017-09-21 DIAGNOSIS — I6522 Occlusion and stenosis of left carotid artery: Secondary | ICD-10-CM | POA: Insufficient documentation

## 2017-09-21 MED ORDER — IOPAMIDOL (ISOVUE-300) INJECTION 61%
INTRAVENOUS | Status: AC
  Filled 2017-09-21: qty 75

## 2017-09-21 MED ORDER — IOPAMIDOL (ISOVUE-300) INJECTION 61%
75.0000 mL | Freq: Once | INTRAVENOUS | Status: AC | PRN
  Administered 2017-09-21: 75 mL via INTRAVENOUS

## 2017-09-22 ENCOUNTER — Ambulatory Visit
Admission: RE | Admit: 2017-09-22 | Discharge: 2017-09-22 | Disposition: A | Discharge status: Home / Self Care | Payer: BLUE CROSS/BLUE SHIELD | Source: Ambulatory Visit | Attending: Obstetrics | Admitting: Obstetrics

## 2017-09-22 DIAGNOSIS — E2839 Other primary ovarian failure: Secondary | ICD-10-CM

## 2017-09-28 ENCOUNTER — Other Ambulatory Visit: Payer: Self-pay | Admitting: Radiation Oncology

## 2017-09-28 DIAGNOSIS — D355 Benign neoplasm of carotid body: Secondary | ICD-10-CM

## 2017-09-30 ENCOUNTER — Encounter: Payer: Self-pay | Admitting: *Deleted

## 2017-09-30 ENCOUNTER — Telehealth: Payer: Self-pay | Admitting: *Deleted

## 2017-09-30 NOTE — Telephone Encounter (Signed)
Oncology Nurse Navigator Documentation  Per Dr. Isidore Moos, called patient to inform her of favorable results of 10/1 CT Neck.  She expressed appreciation for information, indicated preference to see Dr. Isidore Moos for follow-up next available rather than waiting until next year.  RadOnc Med Sec notified.  [name], RN, BSN, [place] [facility] Oncology Nurse [place] [place] [phone]

## 2017-10-05 ENCOUNTER — Telehealth: Payer: Self-pay | Admitting: Cardiovascular Disease

## 2017-10-05 NOTE — Telephone Encounter (Signed)
Spoke with Tasha [name] states that she had a CT and she stats that she is dizzy and has a headache she states that she called her oncologist and they do not seem to be concerned and told her to call us and see what Dr Gwenlyn Found says. She states that these sx are new and will go to the ER and see what they say to do there

## 2017-10-05 NOTE — Telephone Encounter (Signed)
New message   Patient states she is worried about clogged artery. Patient has been like this for about a week.  STAT if patient feels like he/she is going to faint   1) Are you dizzy now? yes  2) Do you feel faint or have you passed out? no  3) Do you have any other symptoms? Patient has pain in neck and behind ear and temple pain.  4) Have you checked your HR and BP (record if available)?  Has not checked

## 2017-10-06 NOTE — Telephone Encounter (Signed)
Pt has upcoming appt scheduled with Dr. Gwenlyn Found tomorrow. Will discuss at this appt.

## 2017-10-06 NOTE — Telephone Encounter (Signed)
noted 

## 2017-10-07 ENCOUNTER — Encounter: Payer: Self-pay | Admitting: Cardiovascular Disease

## 2017-10-07 ENCOUNTER — Ambulatory Visit (INDEPENDENT_AMBULATORY_CARE_PROVIDER_SITE_OTHER): Payer: BLUE CROSS/BLUE SHIELD | Admitting: Cardiovascular Disease

## 2017-10-07 VITALS — BP 120/74 | HR 69 | Ht 65.0 in | Wt 217.8 lb

## 2017-10-07 DIAGNOSIS — I6522 Occlusion and stenosis of left carotid artery: Secondary | ICD-10-CM

## 2017-10-07 DIAGNOSIS — E7800 Pure hypercholesterolemia, unspecified: Secondary | ICD-10-CM

## 2017-10-07 DIAGNOSIS — I10 Essential (primary) hypertension: Secondary | ICD-10-CM | POA: Diagnosis not present

## 2017-10-07 MED ORDER — PITAVASTATIN CALCIUM 4 MG PO TABS: 4.0000 mg | ORAL_TABLET | Freq: Every day | ORAL | 6 refills | Status: DC

## 2017-10-07 NOTE — Progress Notes (Signed)
10/07/2017 Tasha [name]   30-Dec-1955  [id number]  Primary Physician Nanci Pina, FNP Primary Cardiologist: Lorretta Harp MD Lupe Carney, [place]  HPI:  Tasha [name] is a 61 y.o. female mildly overweight single African-American female mother of one child referred by Josefine Class for cardiovascular evaluation because of known carotid disease and atypical chest pain. I last saw her in the office 06/05/17 . Her only risk factor is hypertension and hyperlipidemia. She has never had a heart attack or stroke. 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 She also occasionally gets chest pain weekly basis for somewhat atypical as well as palpitations. A subsequent Myoview stress test performed 07/03/16 was entirely normal and an event monitor performed 06/17/16 revealed sinus rhythm/sinus tachycardia. She was admitted to the hospital with atypical chest pain on 05/31/17 and ruled out for myocardial infarction. Since I saw her in the office in 4 months ago she's had no recurrent chest pain. Her lab work was notable for close cholesterol 162 and LDL of 96. She does admit to not eating a heart healthy diet. We will adjust her statin drug and reevaluate. We will follow her carotid Dopplers on annual basis.   Current Meds  Medication Sig  . aspirin EC 81 MG tablet Take 1 tablet (81 mg total) by mouth daily.  . hydrochlorothiazide (HYDRODIURIL) 25 MG tablet Take 25 mg by mouth daily.   . potassium chloride 20 MEQ TBCR Take 20 mEq by mouth daily.  . [DISCONTINUED] LIVALO 2 MG TABS Take 2 mg by mouth daily.     Allergies  Allergen Reactions  . Antihistamines, Chlorpheniramine-Type Itching  . Hydrocodone     Rash, hives and itching.  . Sulfa Antibiotics Itching    Social History   Social History  . Marital status: Single    Spouse name: N/A  . Number of children: 1  . Years of education:  college   Occupational History  . [employer]   Social History Main Topics  . Smoking status: Former Smoker    Quit date: 12/22/2008  . Smokeless tobacco: Never Used  . 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 Drug use: No     Comment: recovering drug addict  . Sexual activity: Not Currently    Partners: Male    Birth control/ protection: None   Other Topics Concern  . Not on file   Social History Narrative  . No narrative on file     Review of Systems: General: negative for chills, fever, night sweats or weight changes.  Cardiovascular: negative for chest pain, dyspnea on exertion, edema, orthopnea, palpitations, paroxysmal nocturnal dyspnea or shortness of breath Dermatological: negative for rash Respiratory: negative for cough or wheezing Urologic: negative for hematuria Abdominal: negative for nausea, vomiting, diarrhea, bright red blood per rectum, melena, or hematemesis Neurologic: negative for visual changes, syncope, or dizziness All other systems reviewed and are otherwise negative except as noted above.    Blood pressure 120/74, pulse 69, height 5\' 5"  (1.651 m), weight 217 lb 12.8 oz (98.8 kg), last menstrual period 11/03/2012.  General appearance: alert and no distress Neck: no adenopathy, no carotid bruit, no JVD, supple, symmetrical, trachea midline and thyroid not enlarged, symmetric, no tenderness/mass/nodules Lungs: clear to auscultation bilaterally Heart: regular rate and rhythm, S1, S2 normal, no murmur, click,  rub or gallop Extremities: extremities normal, atraumatic, no cyanosis or edema Pulses: 2+ and symmetric Skin: Skin color, texture, turgor normal. No rashes or lesions Neurologic: Alert and oriented X 3, normal strength and tone. Normal symmetric reflexes. Normal coordination and gait  EKG sinus rhythm at 69 without ST or T-wave changes. I personally reviewed this EKG.  ASSESSMENT AND PLAN:    Left-sided carotid artery disease ([place]) History of carotid artery disease with known occluded left ICA by duplex ultrasound 07/06/17. She is neurologically asymptomatic. We will check this on an annual basis.  Essential hypertension History of essential hypertension blood pressure measures 120/74. She is on hydrochlorothiazide. Continue current meds are current dosing.  Hyperlipidemia History of hyperlipidemia on Livalo 2 mg a day. Most recent lipid profile performed 06/01/17 revealed an LDL of 96. She does admit to eating a non-heart healthy diet and we discussed this. I am going to increase her Livalo 2-4 mg a day and will recheck her lipid profile in 3 months.      Lorretta Harp MD FACP,FACC,FAHA, [facility] 10/07/2017 3:13 PM

## 2017-10-07 NOTE — Assessment & Plan Note (Signed)
History of essential hypertension blood pressure measures 120/74. She is on hydrochlorothiazide. Continue current meds are current dosing.

## 2017-10-07 NOTE — Assessment & Plan Note (Signed)
History of hyperlipidemia on Livalo 2 mg a day. Most recent lipid profile performed 06/01/17 revealed an LDL of 96. She does admit to eating a non-heart healthy diet and we discussed this. I am going to increase her Livalo 2-4 mg a day and will recheck her lipid profile in 3 months.

## 2017-10-07 NOTE — Assessment & Plan Note (Signed)
History of carotid artery disease with known occluded left ICA by duplex ultrasound 07/06/17. She is neurologically asymptomatic. We will check this on an annual basis.

## 2017-10-07 NOTE — Patient Instructions (Signed)
Medication Instructions: Increase Pitavastatin (Livalo) to 4 mg daily.   Labwork: Your physician recommends that you return for a FASTING lipid profile and hepatic function panel in 3 months.  Testing: Your physician has requested that you have a carotid duplex in July 2019. This test is an ultrasound of the carotid arteries in your neck. It looks at blood flow through these arteries that supply the brain with blood. Allow one hour for this exam. There are no restrictions or special instructions.   Follow-Up: Your physician wants you to follow-up in: 1 year with Dr. Gwenlyn Found. You will receive a reminder letter in the mail two months in advance. If you don't receive a letter, please call our office to schedule the follow-up appointment.  If you need a refill on your cardiac medications before your next appointment, please call your pharmacy.

## 2017-10-15 ENCOUNTER — Inpatient Hospital Stay: Admission: RE | Admit: 2017-10-15 | Payer: BLUE CROSS/BLUE SHIELD | Source: Ambulatory Visit

## 2017-10-23 ENCOUNTER — Ambulatory Visit (AMBULATORY_SURGERY_CENTER): Payer: Self-pay

## 2017-10-23 VITALS — Ht 64.0 in | Wt 219.6 lb

## 2017-10-23 DIAGNOSIS — Z1211 Encounter for screening for malignant neoplasm of colon: Secondary | ICD-10-CM

## 2017-10-23 MED ORDER — NA SULFATE-K SULFATE-MG SULF 17.5-3.13-1.6 GM/177ML PO SOLN: 1.0000 | Freq: Once | ORAL | 0 refills | Status: AC

## 2017-10-23 NOTE — Progress Notes (Signed)
Denies allergies to eggs or soy products. Denies complication of anesthesia or sedation. Denies use of weight loss medication. Denies use of O2.   Emmi instructions declined.  

## 2017-10-28 ENCOUNTER — Telehealth: Payer: Self-pay | Admitting: Gastroenterology

## 2017-10-28 ENCOUNTER — Encounter: Payer: Self-pay | Admitting: Gastroenterology

## 2017-10-28 NOTE — Telephone Encounter (Signed)
Patient states ins will not cover prep for colon on 11/06/17 and needs something called into [facility] pharmacy. Pt had pv on 10/23/17

## 2017-10-28 NOTE — Telephone Encounter (Signed)
Suprep Sample left at 4th floor receptionist desk.  Returned call to pt.  She plans on picking it up tomorrow around 3:30pm Angela/PV

## 2017-11-05 ENCOUNTER — Encounter (HOSPITAL_COMMUNITY): Payer: Self-pay

## 2017-11-05 ENCOUNTER — Other Ambulatory Visit: Payer: Self-pay

## 2017-11-05 ENCOUNTER — Emergency Department (HOSPITAL_COMMUNITY)
Admission: EM | Admit: 2017-11-05 | Discharge: 2017-11-06 | Disposition: A | Discharge status: Home / Self Care | Payer: BLUE CROSS/BLUE SHIELD | Attending: Emergency Medicine | Admitting: Emergency Medicine

## 2017-11-05 DIAGNOSIS — R51 Headache: Secondary | ICD-10-CM | POA: Diagnosis not present

## 2017-11-05 DIAGNOSIS — E785 Hyperlipidemia, unspecified: Secondary | ICD-10-CM | POA: Diagnosis not present

## 2017-11-05 DIAGNOSIS — I10 Essential (primary) hypertension: Secondary | ICD-10-CM | POA: Insufficient documentation

## 2017-11-05 DIAGNOSIS — J3489 Other specified disorders of nose and nasal sinuses: Secondary | ICD-10-CM

## 2017-11-05 DIAGNOSIS — Z87891 Personal history of nicotine dependence: Secondary | ICD-10-CM | POA: Diagnosis not present

## 2017-11-05 DIAGNOSIS — Z79899 Other long term (current) drug therapy: Secondary | ICD-10-CM | POA: Insufficient documentation

## 2017-11-05 MED ORDER — AMOXICILLIN 500 MG PO CAPS: 500.0000 mg | ORAL_CAPSULE | Freq: Two times a day (BID) | ORAL | 0 refills | Status: AC

## 2017-11-05 NOTE — ED Provider Notes (Signed)
[facility] EMERGENCY DEPARTMENT Provider Note   CSN: [id number] Arrival date & time: 11/05/17  8676     History   Chief Complaint Chief Complaint  Patient presents with  . Headache  . Facial Pain    HPI Tasha [name] is a 61 y.o. female.  HPI Patient is a 61 year old female who presents the emergency department 4 weeks of increasing frontal sinus discomfort and pain.  She did have URI symptoms several weeks ago.  She has no significant URI symptoms at this time.  No fevers or chills.  No injury or trauma to her head.  Denies neck pain.  No dental pain.  Denies weakness of her arms or legs.  Pain is mild to moderate in severity.  She has no other complaints.   Past Medical History:  Diagnosis Date  . DDD (degenerative disc disease)    DJD.  spine.  chronic pain.   . Fibroids   . Hyperlipidemia   . Hypertension   . Pyelonephritis 2004  . Vertigo 2014   positional.  Eval with Dr Jannifer Franklin, neuro, 2014.     Patient Active Problem List   Diagnosis Date Noted  . Acute pain of right shoulder 08/20/2016  . Left-sided carotid artery disease ([place]) 06/17/2016  . Essential hypertension 06/17/2016  . Hyperlipidemia 06/17/2016  . Chest pain 06/17/2016  . Palpitations 06/17/2016  . 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 Hypokalemia 07/26/2014  . Constipation 07/26/2014  . BRBPR (bright red blood per rectum) 07/26/2014  . Hematochezia 07/26/2014  . Benign paroxysmal positional vertigo 05/12/2014  . Dizziness and giddiness 09/27/2013  . Uterine leiomyoma 03/03/2013  . Postmenopausal bleeding 03/03/2013  . Pelvic pain 03/03/2013  . 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 Low back pain 10/23/2011    Past Surgical History:  Procedure Laterality Date  . CESAREAN SECTION    . CHOLECYSTECTOMY  2001  . ERCP W/ SPHICTEROTOMY  2001   removal CBD stones  . NECK SURGERY     cadaver bones put in her  neck  . SKIN BIOPSY  12/2009, 12/9507   3267 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     OB History    Gravida Para Term Preterm AB Living   4 1 1   3 1    SAB TAB Ectopic Multiple Live Births   2       1       Home Medications    Prior to Admission medications   Medication Sig Start Date End Date Taking? Authorizing Provider  aspirin EC 81 MG tablet Take 1 tablet (81 mg total) by mouth daily. 06/01/17  Yes Ledell Noss, MD  Chlorphen-PE-Acetaminophen (CORICIDIN D COLD/FLU/SINUS PO) Take 2 tablets daily as needed by mouth (sinuses).   Yes [provider]  hydrochlorothiazide (HYDRODIURIL) 25 MG tablet Take 25 mg by mouth daily.  09/07/14  Yes [provider]  Pitavastatin Calcium (LIVALO) 4 MG TABS Take 1 tablet (4 mg total) by mouth daily. 10/07/17  Yes Lorretta Harp, MD  potassium chloride 20 MEQ TBCR Take 20 mEq by mouth daily. 06/01/17  Yes Ledell Noss, MD  traMADol (ULTRAM) 50 MG tablet Take by mouth every 6 (six) hours as needed.   Yes [provider]  amoxicillin (AMOXIL) 500 MG capsule Take 1 capsule (500 mg total) 2 (two) times daily for 7 days by mouth. 11/05/17 11/12/17  Jola Schmidt, MD  naproxen  sodium (ALEVE) 220 MG tablet Take 220 mg by mouth.    [provider]    Family History Family History  Problem Relation Age of Onset  . Hypertension Mother   . Cancer Mother   . Hyperlipidemia Mother   . Pancreatic cancer Mother   . Heart disease Father   . Cancer Father   . Heart attack Father   . Rectal cancer Neg Hx   . Stomach cancer Neg Hx   . Colon cancer Neg Hx     Social History Social History   Tobacco Use  . Smoking status: Former Smoker    Last attempt to quit: 12/22/2008    Years since quitting: 8.8  . Smokeless tobacco: Never Used  Substance Use Topics  . 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 Drug use: No    Comment:  recovering drug addict     Allergies   Antihistamines, chlorpheniramine-type; Hydrocodone; and Sulfa antibiotics   Review of Systems Review of Systems  All other systems reviewed and are negative.    Physical Exam Updated Vital Signs BP 116/67 (BP Location: Right Arm)   Pulse 84   Temp 98 F (36.7 C) (Oral)   Resp 18   Ht 5\' 4"  (1.626 m)   Wt 99.3 kg (219 lb)   LMP 11/03/2012   SpO2 100%   BMI 37.59 kg/m   Physical Exam  Constitutional: She is oriented to person, place, and time. She appears well-developed and well-nourished.  HENT:  Head: Normocephalic.  Frontal sinus tenderness.  No overlying skin changes.  Bilateral TMs are normal.  Posterior pharynx is normal.  Nares are normal bilaterally.  No acute intraoral signs of infection  Eyes: EOM are normal.  Neck: Normal range of motion.  Pulmonary/Chest: Effort normal.  Abdominal: She exhibits no distension.  Musculoskeletal: Normal range of motion.  Neurological: She is alert and oriented to person, place, and time.  Psychiatric: She has a normal mood and affect.  Nursing note and vitals reviewed.    ED Treatments / Results  Labs (all labs ordered are listed, but only abnormal results are displayed) Labs Reviewed - No data to display  EKG  EKG Interpretation None       Radiology No results found.  Procedures Procedures (including critical care time)  Medications Ordered in ED Medications - No data to display   Initial Impression / Assessment and Plan / ED Course  I have reviewed the triage vital signs and the nursing notes.  Pertinent labs & imaging results that were available during my care of the patient were reviewed by me and considered in my medical decision making (see chart for details).    Suspect sinus pressure/sinusitis.  Home with short course of antibiotics and primary care follow-up.  No indication for CT imaging at this time.  Doubt mass or bleed.  No weakness of her arms or legs.   Recommended over-the-counter medications for pain  Final Clinical Impressions(s) / ED Diagnoses   Final diagnoses:  Sinus pressure    ED Discharge Orders        Ordered    amoxicillin (AMOXIL) 500 MG capsule  2 times daily     11/05/17 2313       Jola Schmidt, MD 11/05/17 2317

## 2017-11-05 NOTE — ED Triage Notes (Signed)
Pt states that she has had head pain x 3 weeks, pt states "It's not a headache, it feels like my sinuses" VSS. No neuro deficits.

## 2017-11-05 NOTE — ED Notes (Signed)
Patient ambulatory to the restroom.

## 2017-11-06 ENCOUNTER — Ambulatory Visit (AMBULATORY_SURGERY_CENTER): Payer: BLUE CROSS/BLUE SHIELD | Admitting: Gastroenterology

## 2017-11-06 ENCOUNTER — Encounter: Payer: Self-pay | Admitting: Gastroenterology

## 2017-11-06 VITALS — BP 117/67 | HR 65 | Temp 97.5°F | Resp 11 | Ht 65.0 in | Wt 217.0 lb

## 2017-11-06 DIAGNOSIS — Z1212 Encounter for screening for malignant neoplasm of rectum: Secondary | ICD-10-CM

## 2017-11-06 DIAGNOSIS — Z1211 Encounter for screening for malignant neoplasm of colon: Secondary | ICD-10-CM | POA: Diagnosis not present

## 2017-11-06 MED ORDER — SODIUM CHLORIDE 0.9 % IV SOLN: 500.0000 mL | INTRAVENOUS | Status: DC

## 2017-11-06 NOTE — Op Note (Signed)
[place] Patient Name: Tasha [name] Procedure Date: 11/06/2017 8:52 AM MRN: [id number] Endoscopist: Remo Lipps P. Armbruster MD, MD Age: 61 Referring MD:  Date of Birth: September 20, 1956 Gender: Female Account #: [id number] Procedure:                Colonoscopy Indications:              Screening for colorectal malignant neoplasm Medicines:                Monitored Anesthesia Care Procedure:                Pre-Anesthesia Assessment:                           - Prior to the procedure, a History and Physical                            was performed, and patient medications and                            allergies were reviewed. The patient's tolerance of                            previous anesthesia was also reviewed. The risks                            and benefits of the procedure and the sedation                            options and risks were discussed with the patient.                            All questions were answered, and informed consent                            was obtained. Prior Anticoagulants: The patient has                            taken no previous anticoagulant or antiplatelet                            agents. ASA Grade Assessment: III - A patient with                            severe systemic disease. After reviewing the risks                            and benefits, the patient was deemed in                            satisfactory condition to undergo the procedure.                           After obtaining informed consent, the colonoscope  was passed under direct vision. Throughout the                            procedure, the patient's blood pressure, pulse, and                            oxygen saturations were monitored continuously. The                            Colonoscope was introduced through the anus and                            advanced to the the cecum, identified by                            appendiceal  orifice and ileocecal valve. The                            colonoscopy was technically difficult and complex                            due to inadequate bowel prep. The patient tolerated                            the procedure well. The quality of the bowel                            preparation was fair. The ileocecal valve,                            appendiceal orifice, and rectum were photographed. Scope In: 8:56:44 AM Scope Out: 9:12:50 AM Scope Withdrawal Time: 0 hours 11 minutes 44 seconds  Total Procedure Duration: 0 hours 16 minutes 6 seconds  Findings:                 The perianal and digital rectal examinations were                            normal.                           A large amount of semi-liquid stool was found in                            the entire colon, interfering with visualization.                            Lavage of the area was performed using copious                            amounts of sterile water, resulting in incomplete                            clearance with fair visualization. Most of the  right colon and transverse was able to be cleared                            for the most part. The splenic flexure and                            descending colon had the highest burden of residual                            stool. Most of this was cleared but flat or small                            polyps may not have been appreciated.                           Internal hemorrhoids were found during retroflexion.                           The exam was otherwise without abnormality. No                            large polyps or mass lesions, but small or flat                            polyps particularly in the left colon may not have                            been visualized. Complications:            No immediate complications. Estimated blood loss:                            None. Estimated Blood Loss:     Estimated blood  loss: none. Impression:               - Preparation of the colon was fair as outlined                            above.                           - Internal hemorrhoids.                           - The examination was otherwise normal.                           - No large polyps or mass lesions. Recommendation:           - Patient has a contact number available for                            emergencies. The signs and symptoms of potential                            delayed  complications were discussed with the                            patient. Return to normal activities tomorrow.                            Written discharge instructions were provided to the                            patient.                           - Resume previous diet.                           - Continue present medications.                           - Repeat colonoscopy within 1 year because the                            bowel preparation was suboptimal.                           - Of note, it was very difficult to obtain IV                            access for this patient, multiple providers needed                            and multiple sticks in order to place small IV.                            Recommend next colonoscopy at the hospital due to                            IV access issues. Remo Lipps P. Armbruster MD, MD 11/06/2017 9:18:11 AM This report has been signed electronically.

## 2017-11-06 NOTE — Patient Instructions (Signed)
Handout given: Hemorrhoids.    YOU HAD AN ENDOSCOPIC PROCEDURE TODAY AT [facility]:   Refer to the procedure report that was given to you for any specific questions about what was found during the examination.  If the procedure report does not answer your questions, please call your gastroenterologist to clarify.  If you requested that your care partner not be given the details of your procedure findings, then the procedure report has been included in a sealed envelope for you to review at your convenience later.  YOU SHOULD EXPECT: Some feelings of bloating in the abdomen. Passage of more gas than usual.  Walking can help get rid of the air that was put into your GI tract during the procedure and reduce the bloating. If you had a lower endoscopy (such as a colonoscopy or flexible sigmoidoscopy) you may notice spotting of blood in your stool or on the toilet paper. If you underwent a bowel prep for your procedure, you may not have a normal bowel movement for a few days.  Please Note:  You might notice some irritation and congestion in your nose or some drainage.  This is from the oxygen used during your procedure.  There is no need for concern and it should clear up in a day or so.  SYMPTOMS TO REPORT IMMEDIATELY:   Following lower endoscopy (colonoscopy or flexible sigmoidoscopy):  Excessive amounts of blood in the stool  Significant tenderness or worsening of abdominal pains  Swelling of the abdomen that is new, acute  Fever of 100F or higher   For urgent or emergent issues, a gastroenterologist can be reached at any hour by calling [phone].   DIET:  We do recommend a small meal at first, but then you may proceed to your regular diet.  Drink plenty of fluids but you should avoid alcoholic beverages for 24 hours.  ACTIVITY:  You should plan to take it easy for the rest of today and you should NOT DRIVE or use heavy machinery until tomorrow (because of the sedation  medicines used during the test).    FOLLOW UP: Our staff will call the number listed on your records the next business day following your procedure to check on you and address any questions or concerns that you may have regarding the information given to you following your procedure. If we do not reach you, we will leave a message.  However, if you are feeling well and you are not experiencing any problems, there is no need to return our call.  We will assume that you have returned to your regular daily activities without incident.  If any biopsies were taken you will be contacted by phone or by letter within the next 1-3 weeks.  Please call us at [phone] if you have not heard about the biopsies in 3 weeks.    SIGNATURES/CONFIDENTIALITY: You and/or your care partner have signed paperwork which will be entered into your electronic medical record.  These signatures attest to the fact that that the information above on your After Visit Summary has been reviewed and is understood.  Full responsibility of the confidentiality of this discharge information lies with you and/or your care-partner.

## 2017-11-06 NOTE — ED Notes (Signed)
Patient Alert and oriented X4. Stable and ambulatory. Patient verbalized understanding of the discharge instructions.  Patient belongings were taken by the patient.  

## 2017-11-06 NOTE — Progress Notes (Signed)
Report given to PACU, vss 

## 2017-11-09 ENCOUNTER — Telehealth: Payer: Self-pay

## 2017-11-09 NOTE — Telephone Encounter (Signed)
  Follow up Call-  Call Tasha [name] number 11/06/2017  Post procedure Call Tasha [name] phone  # [phone]  Permission to leave phone message Yes  Some recent data might be hidden     Patient questions:  Do you have a fever, pain , or abdominal swelling? No. Pain Score  0 *  Have you tolerated food without any problems? Yes.    Have you been able to return to your normal activities? Yes.    Do you have any questions about your discharge instructions: Diet   No. Medications  No. Follow up visit  No.  Do you have questions or concerns about your Care? No.  Actions: * If pain score is 4 or above: No action needed, pain <4.

## 2017-11-23 ENCOUNTER — Encounter (HOSPITAL_COMMUNITY): Payer: Self-pay

## 2017-11-23 ENCOUNTER — Emergency Department (HOSPITAL_COMMUNITY): Payer: BLUE CROSS/BLUE SHIELD

## 2017-11-23 ENCOUNTER — Emergency Department (HOSPITAL_COMMUNITY)
Admission: EM | Admit: 2017-11-23 | Discharge: 2017-11-23 | Disposition: A | Discharge status: Home / Self Care | Payer: BLUE CROSS/BLUE SHIELD | Attending: Emergency Medicine | Admitting: Emergency Medicine

## 2017-11-23 ENCOUNTER — Other Ambulatory Visit: Payer: Self-pay

## 2017-11-23 DIAGNOSIS — R079 Chest pain, unspecified: Secondary | ICD-10-CM | POA: Diagnosis not present

## 2017-11-23 DIAGNOSIS — Z5321 Procedure and treatment not carried out due to patient leaving prior to being seen by health care provider: Secondary | ICD-10-CM | POA: Diagnosis not present

## 2017-11-23 LAB — CBC
HCT: 36.1 % (ref 36.0–46.0)
Hemoglobin: 12.7 g/dL (ref 12.0–15.0)
MCH: 30 pg (ref 26.0–34.0)
MCHC: 35.2 g/dL (ref 30.0–36.0)
MCV: 85.3 fL (ref 78.0–100.0)
Platelets: 277 10*3/uL (ref 150–400)
RBC: 4.23 MIL/uL (ref 3.87–5.11)
RDW: 14.9 % (ref 11.5–15.5)
WBC: 8.7 10*3/uL (ref 4.0–10.5)

## 2017-11-23 LAB — BASIC METABOLIC PANEL
Anion gap: 11 (ref 5–15)
BUN: 28 mg/dL — AB (ref 6–20)
CALCIUM: 9.5 mg/dL (ref 8.9–10.3)
CO2: 24 mmol/L (ref 22–32)
CREATININE: 1.27 mg/dL — AB (ref 0.44–1.00)
Chloride: 104 mmol/L (ref 101–111)
GFR calc Af Amer: 52 mL/min — ABNORMAL LOW (ref >60)
GFR, EST NON AFRICAN AMERICAN: 45 mL/min — AB (ref >60)
GLUCOSE: 120 mg/dL — AB (ref 65–99)
SODIUM: 139 mmol/L (ref 135–145)

## 2017-11-23 LAB — I-STAT TROPONIN, ED: Troponin i, poc: 0 ng/mL (ref 0.00–0.08)

## 2017-11-23 NOTE — ED Notes (Signed)
Pt did not answer for room placement.  This RN called patient to make her aware of low potassium level.  Pt made aware of cardiac risks if left untreated. Pt stated she wanted to wait and call her primary care in the morning and has had potassium problems in the past.  Pt told that we suggest she returns to be seen but pt persisted to say she wanted to stay at home.  Made her aware she can return at any time and to return if she develops any worsening symptoms.

## 2017-11-23 NOTE — ED Notes (Signed)
RN notified of abnormal lab 

## 2017-11-23 NOTE — ED Triage Notes (Signed)
Patient c/o mid upper abdominal pain that radiates into her mid chest and left ar, and states it has been constant today. Patient reports intermittent chest pain x 2 months. Patient states she walked up 30 steps today and became SOB which is unusual for her.

## 2017-11-25 ENCOUNTER — Observation Stay (HOSPITAL_COMMUNITY)
Admission: EM | Admit: 2017-11-25 | Discharge: 2017-11-27 | Disposition: A | Discharge status: Home / Self Care | Payer: BLUE CROSS/BLUE SHIELD | Attending: Internal Medicine | Admitting: Internal Medicine

## 2017-11-25 ENCOUNTER — Encounter (HOSPITAL_COMMUNITY): Payer: Self-pay | Admitting: Emergency Medicine

## 2017-11-25 ENCOUNTER — Other Ambulatory Visit: Payer: Self-pay

## 2017-11-25 ENCOUNTER — Emergency Department (HOSPITAL_COMMUNITY): Payer: BLUE CROSS/BLUE SHIELD

## 2017-11-25 DIAGNOSIS — E872 Acidosis: Secondary | ICD-10-CM | POA: Insufficient documentation

## 2017-11-25 DIAGNOSIS — R0789 Other chest pain: Secondary | ICD-10-CM | POA: Insufficient documentation

## 2017-11-25 DIAGNOSIS — Z981 Arthrodesis status: Secondary | ICD-10-CM | POA: Diagnosis not present

## 2017-11-25 DIAGNOSIS — R079 Chest pain, unspecified: Secondary | ICD-10-CM | POA: Diagnosis present

## 2017-11-25 DIAGNOSIS — Z7982 Long term (current) use of aspirin: Secondary | ICD-10-CM | POA: Insufficient documentation

## 2017-11-25 DIAGNOSIS — E876 Hypokalemia: Principal | ICD-10-CM | POA: Insufficient documentation

## 2017-11-25 DIAGNOSIS — Z79899 Other long term (current) drug therapy: Secondary | ICD-10-CM | POA: Diagnosis not present

## 2017-11-25 DIAGNOSIS — I739 Peripheral vascular disease, unspecified: Secondary | ICD-10-CM

## 2017-11-25 DIAGNOSIS — I10 Essential (primary) hypertension: Secondary | ICD-10-CM | POA: Diagnosis not present

## 2017-11-25 DIAGNOSIS — E785 Hyperlipidemia, unspecified: Secondary | ICD-10-CM | POA: Diagnosis not present

## 2017-11-25 DIAGNOSIS — Z9049 Acquired absence of other specified parts of digestive tract: Secondary | ICD-10-CM | POA: Insufficient documentation

## 2017-11-25 DIAGNOSIS — I6522 Occlusion and stenosis of left carotid artery: Secondary | ICD-10-CM | POA: Diagnosis not present

## 2017-11-25 DIAGNOSIS — Z87891 Personal history of nicotine dependence: Secondary | ICD-10-CM | POA: Diagnosis not present

## 2017-11-25 DIAGNOSIS — I779 Disorder of arteries and arterioles, unspecified: Secondary | ICD-10-CM | POA: Diagnosis present

## 2017-11-25 HISTORY — DX: Hypokalemia: E87.6

## 2017-11-25 LAB — CBC WITH DIFFERENTIAL/PLATELET
BASOS PCT: 0 %
Basophils Absolute: 0 10*3/uL (ref 0.0–0.1)
EOS PCT: 2 %
Eosinophils Absolute: 0.1 10*3/uL (ref 0.0–0.7)
HCT: 36 % (ref 36.0–46.0)
HEMOGLOBIN: 12.4 g/dL (ref 12.0–15.0)
LYMPHS ABS: 2 10*3/uL (ref 0.7–4.0)
Lymphocytes Relative: 28 %
MCH: 29.7 pg (ref 26.0–34.0)
MCHC: 34.4 g/dL (ref 30.0–36.0)
MCV: 86.3 fL (ref 78.0–100.0)
MONO ABS: 0.4 10*3/uL (ref 0.1–1.0)
MONOS PCT: 5 %
NEUTROS PCT: 65 %
Neutro Abs: 4.6 10*3/uL (ref 1.7–7.7)
Platelets: 235 10*3/uL (ref 150–400)
RBC: 4.17 MIL/uL (ref 3.87–5.11)
RDW: 15 % (ref 11.5–15.5)
WBC: 7.1 10*3/uL (ref 4.0–10.5)

## 2017-11-25 LAB — COMPREHENSIVE METABOLIC PANEL
ALT: 26 U/L (ref 14–54)
AST: 23 U/L (ref 15–41)
Albumin: 3.6 g/dL (ref 3.5–5.0)
Alkaline Phosphatase: 84 U/L (ref 38–126)
Anion gap: 14 (ref 5–15)
BUN: 29 mg/dL — AB (ref 6–20)
CHLORIDE: 103 mmol/L (ref 101–111)
CO2: 20 mmol/L — AB (ref 22–32)
CREATININE: 1 mg/dL (ref 0.44–1.00)
Calcium: 9.4 mg/dL (ref 8.9–10.3)
GFR calc Af Amer: >60 mL/min (ref >60)
GFR calc non Af Amer: 60 mL/min — ABNORMAL LOW (ref >60)
Glucose, Bld: 143 mg/dL — ABNORMAL HIGH (ref 65–99)
Potassium: 2.3 mmol/L — CL (ref 3.5–5.1)
Sodium: 137 mmol/L (ref 135–145)
Total Bilirubin: 0.4 mg/dL (ref 0.3–1.2)
Total Protein: 7.8 g/dL (ref 6.5–8.1)

## 2017-11-25 MED ORDER — ASPIRIN 81 MG PO CHEW
324.0000 mg | CHEWABLE_TABLET | Freq: Once | ORAL | Status: AC
  Administered 2017-11-26: 324 mg via ORAL
  Filled 2017-11-25: qty 4

## 2017-11-25 MED ORDER — POTASSIUM CHLORIDE CRYS ER 20 MEQ PO TBCR
40.0000 meq | EXTENDED_RELEASE_TABLET | Freq: Once | ORAL | Status: AC
  Administered 2017-11-26: 40 meq via ORAL
  Filled 2017-11-25: qty 2

## 2017-11-25 MED ORDER — POTASSIUM CHLORIDE 10 MEQ/100ML IV SOLN
10.0000 meq | Freq: Once | INTRAVENOUS | Status: AC
  Administered 2017-11-26: 10 meq via INTRAVENOUS
  Filled 2017-11-25: qty 100

## 2017-11-25 NOTE — ED Triage Notes (Signed)
Pt to ER for abnormal lab. States was at [facility] on Monday, left AMA, was called and told to come back because potassium was "2%." pt in NAD.

## 2017-11-25 NOTE — ED Notes (Signed)
Date and time results received: 11/25/17 8:15 PM  (use smartphrase ".now" to insert current time)  Test: Potassium Critical Value: 2.3  Name of Provider Notified: Dr. Regenia Skeeter and nurse first Fenton Malling RN  Orders Received? Or Actions Taken?: no new orders

## 2017-11-25 NOTE — ED Notes (Signed)
Pt in x-ray at this time

## 2017-11-25 NOTE — ED Provider Notes (Signed)
[facility] EMERGENCY DEPARTMENT Provider Note   CSN: [id number] Arrival date & time: 11/25/17  1757     History   Chief Complaint Chief Complaint  Patient presents with  . Abnormal Lab    HPI Tasha [name] is a 61 y.o. female with history of HTN and HLD presents to the ED for evaluation of low potassium and intermittent central chest discomfort. Chest discomfort is brief, and nonexertional and nonpleuritic, nonradiating. Has had dry lingering cough since Monday, but no fevers. Not associated shortness of breath, palpitations, dizziness cough. Chest discomfort brought her to [facility] ED 3 days ago where she got labs drawn but had to leave before being seen. She was called by the ED and was told her potassium was very low. She returns today for potassium recheck and central chest discomfort.States this is not the first time her potassium has been low, thinks is about the third or fourth time. Has been told it is from HCTZ she takes, 20 mg daily. Sometimes misses doses.  Denies fevers, chills, nausea, vomiting, diaphoresis, palpitations, light headedness, abdominal pain, dysuria.  HPI  Past Medical History:  Diagnosis Date  . DDD (degenerative disc disease)    DJD.  spine.  chronic pain.   . Fibroids   . Hyperlipidemia   . Hypertension   . Pyelonephritis 2004  . Vertigo 2014   positional.  Eval with Dr Jannifer Franklin, neuro, 2014.     Patient Active Problem List   Diagnosis Date Noted  . Acute pain of right shoulder 08/20/2016  . Left-sided carotid artery disease ([place]) 06/17/2016  . Essential hypertension 06/17/2016  . Hyperlipidemia 06/17/2016  . Chest pain 06/17/2016  . Palpitations 06/17/2016  . 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 Hypokalemia 07/26/2014  . Constipation 07/26/2014  . BRBPR (bright red blood per rectum) 07/26/2014  . Hematochezia 07/26/2014  . Benign paroxysmal positional vertigo 05/12/2014  . Dizziness and giddiness 09/27/2013  . Uterine leiomyoma  03/03/2013  . Postmenopausal bleeding 03/03/2013  . Pelvic pain 03/03/2013  . 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 Low back pain 10/23/2011    Past Surgical History:  Procedure Laterality Date  . CESAREAN SECTION    . CHOLECYSTECTOMY  2001  . ERCP W/ SPHICTEROTOMY  2001   removal CBD stones  . NECK SURGERY     cadaver bones put in her neck  . 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     OB History    Gravida Para Term Preterm AB Living   4 1 1   3 1    SAB TAB Ectopic Multiple Live Births   2       1       Home Medications    Prior to Admission medications   Medication Sig Start Date End Date Taking? Authorizing Provider  aspirin EC 81 MG tablet Take 1 tablet (81 mg total) by mouth daily. 06/01/17  Yes Ledell Noss, MD  hydrochlorothiazide (HYDRODIURIL) 25 MG tablet Take 25 mg by mouth daily.  09/07/14  Yes [provider]  ibuprofen (ADVIL,MOTRIN) 200 MG tablet Take 200 mg by mouth every 6 (six) hours as needed (for pain or headaches).    Yes [provider]  naproxen sodium (ALEVE) 220 MG tablet Take 220 mg by mouth 2 (two) times daily as needed (for pain or headaches).    Yes [provider]  Pitavastatin Calcium (LIVALO) 4 MG TABS Take 1 tablet (4 mg total) by mouth daily. 10/07/17  Yes Lorretta Harp, MD  potassium chloride SA (K-DUR,KLOR-CON) 20 MEQ tablet Take 20 mEq by mouth daily. 11/23/17  Yes [provider]  traMADol (ULTRAM) 50 MG tablet Take 50 mg by mouth every 6 (six) hours as needed for moderate pain.    Yes [provider]    Family History Family History  Problem Relation Age of Onset  . Hypertension Mother   . Cancer Mother   . Hyperlipidemia Mother   . Pancreatic cancer Mother   . Heart disease Father   . Cancer Father   . Heart attack Father   . Rectal cancer Neg Hx     . Stomach cancer Neg Hx   . Colon cancer Neg Hx     Social History Social History   Tobacco Use  . Smoking status: Former Smoker    Last attempt to quit: 12/22/2008    Years since quitting: 8.9  . Smokeless tobacco: Never Used  Substance Use Topics  . 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 Drug use: No    Comment: took this am     Allergies   Antihistamines, chlorpheniramine-type; Hydrocodone; and Sulfa antibiotics   Review of Systems Review of Systems  Respiratory: Positive for cough.   Cardiovascular: Positive for chest pain.  All other systems reviewed and are negative.    Physical Exam Updated Vital Signs BP 123/64   Pulse 70   Temp 98 F (36.7 C) (Oral)   Resp 15   LMP 11/03/2012   SpO2 99%   Physical Exam  Constitutional: She is oriented to person, place, and time. She appears well-developed and well-nourished. No distress.  NAD.  HENT:  Head: Normocephalic and atraumatic.  Right Ear: External ear normal.  Left Ear: External ear normal.  Nose: Nose normal.  Moist mucous membranes Tonsils and oropharynx normal  Eyes: Conjunctivae, EOM and lids are normal. No scleral icterus.  Neck: Trachea normal and normal range of motion. Neck supple.  Neck is supple Trachea midline No cervical adenopathy  Cardiovascular: Normal rate, regular rhythm, S1 normal, S2 normal and normal heart sounds.  No murmur heard. Pulses:      Carotid pulses are 2+ on the right side, and 2+ on the left side.      Radial pulses are 2+ on the right side, and 2+ on the left side.       Dorsalis pedis pulses are 2+ on the right side, and 2+ on the left side.  RRR No S3 No orthopnea No LE edema  Pulmonary/Chest: Effort normal and breath sounds normal. No respiratory distress. She has no decreased breath sounds. She has no wheezes. She has no rhonchi. She has no rales.  No chest wall tenderness CP not reproducible with  AROM of upper extremities  Abdominal: Soft. Bowel sounds are normal. There is no tenderness.  No epigastric tenderness  Musculoskeletal: Normal range of motion. She exhibits no deformity.  Lymphadenopathy:    She has no cervical adenopathy.  Neurological: She is alert and oriented to person, place, and time. GCS eye subscore is 4. GCS verbal subscore is 5. GCS motor subscore is 6.  Skin: Skin is warm and dry. Capillary refill takes less than 2 seconds.  Psychiatric: She has a normal mood and affect. Her speech is normal and behavior  is normal. Judgment and thought content normal. Cognition and memory are normal.  Nursing note and vitals reviewed.    ED Treatments / Results  Labs (all labs ordered are listed, but only abnormal results are displayed) Labs Reviewed  COMPREHENSIVE METABOLIC PANEL - Abnormal; Notable for the following components:      Result Value   Potassium 2.3 (*)    CO2 20 (*)    Glucose, Bld 143 (*)    BUN 29 (*)    GFR calc non Af Amer 60 (*)    All other components within normal limits  TROPONIN I - Abnormal; Notable for the following components:   Troponin I 0.03 (*)    All other components within normal limits  TROPONIN I - Abnormal; Notable for the following components:   Troponin I 0.03 (*)    All other components within normal limits  BASIC METABOLIC PANEL - Abnormal; Notable for the following components:   Potassium 2.4 (*)    CO2 20 (*)    Glucose, Bld 131 (*)    BUN 25 (*)    Calcium 8.8 (*)    All other components within normal limits  MAGNESIUM - Abnormal; Notable for the following components:   Magnesium 2.5 (*)    All other components within normal limits  CBC WITH DIFFERENTIAL/PLATELET  MAGNESIUM  TROPONIN I    EKG  EKG Interpretation  Date/Time:  Wednesday November 25 2017 23:59:37 EST Ventricular Rate:  75 PR Interval:    QRS Duration: 106 QT Interval:  404 QTC Calculation: 452 R Axis:   58 Text Interpretation:  Sinus rhythm  Abnormal R-wave progression, early transition Probable anteroseptal infarct, old No significant change since last tracing Confirmed by Pryor Curia [phone]) on 11/26/2017 12:06:53 AM       Radiology Dg Chest 2 View  Result Date: 11/25/2017 CLINICAL DATA:  61 year old female with cough and chest pain. EXAM: CHEST  2 VIEW COMPARISON:  Chest radiograph dated 11/23/2017 FINDINGS: The lungs are clear. There is no pleural effusion or pneumothorax. The cardiac silhouette is within normal limits. Cervical fusion plate and screws noted. No acute osseous pathology. Cholecystectomy clips. IMPRESSION: No active cardiopulmonary disease. Electronically Signed   By: Anner Crete M.D.   On: 11/25/2017 23:42    Procedures Procedures (including critical care time)  Medications Ordered in ED Medications  aspirin EC tablet 81 mg (not administered)  pravastatin (PRAVACHOL) tablet 80 mg (not administered)  potassium chloride SA (K-DUR,KLOR-CON) CR tablet 20 mEq (not administered)  traMADol (ULTRAM) tablet 50 mg (not administered)  enoxaparin (LOVENOX) injection 40 mg (not administered)  sodium chloride flush (NS) 0.9 % injection 3 mL (3 mLs Intravenous Given 11/26/17 0523)  0.9 % NaCl with KCl 40 mEq / L  infusion (100 mL/hr Intravenous New Bag/Given 11/26/17 0310)  acetaminophen (TYLENOL) tablet 650 mg (not administered)    Or  acetaminophen (TYLENOL) suppository 650 mg (not administered)  senna-docusate (Senokot-S) tablet 1 tablet (not administered)  bisacodyl (DULCOLAX) EC tablet 5 mg (not administered)  ondansetron (ZOFRAN) tablet 4 mg (not administered)    Or  ondansetron (ZOFRAN) injection 4 mg (not administered)  hydrALAZINE (APRESOLINE) injection 10 mg (not administered)  aspirin chewable tablet 324 mg (324 mg Oral Given 11/26/17 0000)  potassium chloride 10 mEq in 100 mL IVPB (0 mEq Intravenous Stopped 11/26/17 0309)  potassium chloride SA (K-DUR,KLOR-CON) CR tablet 40 mEq (40 mEq Oral Given  11/26/17 0002)  magnesium sulfate IVPB 2 g 50 mL (0 g  Intravenous Stopped 11/26/17 0630)     Initial Impression / Assessment and Plan / ED Course  I have reviewed the triage vital signs and the nursing notes.  Pertinent labs & imaging results that were available during my care of the patient were reviewed by me and considered in my medical decision making (see chart for details).     61 year old female with history of hypertension and hyperlipidemia presents for low potassium and chest pain 3 days. She has history of hypokalemia thought to be from HCTZ. Chest pain sounds very atypical. Chart review reveals patient had recent admission for hypokalemia and atypical chest pain, hypokalemia responded well to repletion. She had a normal Myoview stress test and event monitor in 2017. No previous history of heart attacks or strokes.  On exam today patient is nontoxic. Vital signs WNL. Currently no chest pain. No signs of fluid overload or depletion.  Patient presented to [facility] ED 2 days ago for chest pain, at that time potassium was undetectable. She LWBS and returned today. K today 2.3. Mg pending. States she has been compliant with potassium supplements. Will request admission for hypokalemia and atypical CP. HEART score = 2-3 based on age, HLD, HTN and EKG.   Final Clinical Impressions(s) / ED Diagnoses   Final diagnoses:  Hypokalemia    ED Discharge Orders    None       Kinnie Feil, PA-C 11/26/17 1093    Sherwood Gambler, MD 11/28/17 [phone]

## 2017-11-26 ENCOUNTER — Other Ambulatory Visit: Payer: Self-pay

## 2017-11-26 ENCOUNTER — Encounter (HOSPITAL_COMMUNITY): Payer: Self-pay | Admitting: Family Medicine

## 2017-11-26 DIAGNOSIS — E7800 Pure hypercholesterolemia, unspecified: Secondary | ICD-10-CM | POA: Diagnosis not present

## 2017-11-26 DIAGNOSIS — R079 Chest pain, unspecified: Secondary | ICD-10-CM | POA: Diagnosis not present

## 2017-11-26 DIAGNOSIS — E876 Hypokalemia: Secondary | ICD-10-CM

## 2017-11-26 DIAGNOSIS — I10 Essential (primary) hypertension: Secondary | ICD-10-CM

## 2017-11-26 HISTORY — DX: Hypokalemia: E87.6

## 2017-11-26 LAB — BASIC METABOLIC PANEL
Anion gap: 11 (ref 5–15)
Anion gap: 5 (ref 5–15)
BUN: 25 mg/dL — AB (ref 6–20)
BUN: 18 mg/dL (ref 6–20)
CALCIUM: 8.8 mg/dL — AB (ref 8.9–10.3)
CHLORIDE: 115 mmol/L — AB (ref 101–111)
CO2: 20 mmol/L — AB (ref 22–32)
CO2: 20 mmol/L — ABNORMAL LOW (ref 22–32)
CREATININE: 0.75 mg/dL (ref 0.44–1.00)
CREATININE: 0.73 mg/dL (ref 0.44–1.00)
Calcium: 8.5 mg/dL — ABNORMAL LOW (ref 8.9–10.3)
Chloride: 106 mmol/L (ref 101–111)
GFR calc Af Amer: >60 mL/min (ref >60)
GFR calc non Af Amer: >60 mL/min (ref >60)
GLUCOSE: 106 mg/dL — AB (ref 65–99)
Glucose, Bld: 131 mg/dL — ABNORMAL HIGH (ref 65–99)
Potassium: 2.4 mmol/L — CL (ref 3.5–5.1)
Potassium: 3.7 mmol/L (ref 3.5–5.1)
SODIUM: 137 mmol/L (ref 135–145)
SODIUM: 140 mmol/L (ref 135–145)

## 2017-11-26 LAB — MAGNESIUM: MAGNESIUM: 2.5 mg/dL — AB (ref 1.7–2.4)

## 2017-11-26 LAB — TROPONIN I
TROPONIN I: 0.03 ng/mL — AB (ref <0.03)
TROPONIN I: 0.03 ng/mL — AB (ref <0.03)
Troponin I: 0.03 ng/mL (ref <0.03)

## 2017-11-26 MED ORDER — SENNOSIDES-DOCUSATE SODIUM 8.6-50 MG PO TABS: 1.0000 | ORAL_TABLET | Freq: Every evening | ORAL | Status: DC | PRN

## 2017-11-26 MED ORDER — ONDANSETRON HCL 4 MG/2ML IJ SOLN: 4.0000 mg | Freq: Four times a day (QID) | INTRAMUSCULAR | Status: DC | PRN

## 2017-11-26 MED ORDER — SODIUM CHLORIDE 0.9% FLUSH
3.0000 mL | Freq: Two times a day (BID) | INTRAVENOUS | Status: DC
  Administered 2017-11-26 (×2): 3 mL via INTRAVENOUS

## 2017-11-26 MED ORDER — POTASSIUM CHLORIDE IN NACL 40-0.9 MEQ/L-% IV SOLN
INTRAVENOUS | Status: AC
  Administered 2017-11-26: 100 mL/h via INTRAVENOUS
  Filled 2017-11-26: qty 1000

## 2017-11-26 MED ORDER — ONDANSETRON HCL 4 MG PO TABS: 4.0000 mg | ORAL_TABLET | Freq: Four times a day (QID) | ORAL | Status: DC | PRN

## 2017-11-26 MED ORDER — ACETAMINOPHEN 650 MG RE SUPP: 650.0000 mg | Freq: Four times a day (QID) | RECTAL | Status: DC | PRN

## 2017-11-26 MED ORDER — ENOXAPARIN SODIUM 40 MG/0.4ML ~~LOC~~ SOLN
40.0000 mg | Freq: Every day | SUBCUTANEOUS | Status: DC
  Administered 2017-11-26: 40 mg via SUBCUTANEOUS
  Filled 2017-11-26 (×2): qty 0.4

## 2017-11-26 MED ORDER — BISACODYL 5 MG PO TBEC: 5.0000 mg | DELAYED_RELEASE_TABLET | Freq: Every day | ORAL | Status: DC | PRN

## 2017-11-26 MED ORDER — ACETAMINOPHEN 325 MG PO TABS: 650.0000 mg | ORAL_TABLET | Freq: Four times a day (QID) | ORAL | Status: DC | PRN

## 2017-11-26 MED ORDER — PRAVASTATIN SODIUM 40 MG PO TABS
80.0000 mg | ORAL_TABLET | Freq: Every day | ORAL | Status: DC
  Administered 2017-11-26: 80 mg via ORAL
  Filled 2017-11-26: qty 2

## 2017-11-26 MED ORDER — MAGNESIUM SULFATE 2 GM/50ML IV SOLN
2.0000 g | Freq: Once | INTRAVENOUS | Status: AC
  Administered 2017-11-26: 2 g via INTRAVENOUS
  Filled 2017-11-26: qty 50

## 2017-11-26 MED ORDER — TRAMADOL HCL 50 MG PO TABS: 50.0000 mg | ORAL_TABLET | Freq: Four times a day (QID) | ORAL | Status: DC | PRN

## 2017-11-26 MED ORDER — ASPIRIN EC 81 MG PO TBEC
81.0000 mg | DELAYED_RELEASE_TABLET | Freq: Every day | ORAL | Status: DC
  Administered 2017-11-26: 81 mg via ORAL
  Filled 2017-11-26: qty 1

## 2017-11-26 MED ORDER — POTASSIUM CHLORIDE CRYS ER 20 MEQ PO TBCR: 20.0000 meq | EXTENDED_RELEASE_TABLET | Freq: Two times a day (BID) | ORAL | Status: DC

## 2017-11-26 MED ORDER — HYDRALAZINE HCL 20 MG/ML IJ SOLN: 10.0000 mg | INTRAMUSCULAR | Status: DC | PRN

## 2017-11-26 MED ORDER — POTASSIUM CHLORIDE CRYS ER 20 MEQ PO TBCR
40.0000 meq | EXTENDED_RELEASE_TABLET | ORAL | Status: AC
  Administered 2017-11-26 (×5): 40 meq via ORAL
  Filled 2017-11-26 (×5): qty 2

## 2017-11-26 NOTE — ED Notes (Signed)
Heart Healthy diet breakfast tray ordered @ 0805.

## 2017-11-26 NOTE — ED Notes (Signed)
Pt given menu 

## 2017-11-26 NOTE — ED Notes (Signed)
Pt ambulated to and from restroom without difficulty.   

## 2017-11-26 NOTE — ED Notes (Signed)
Pt ambulatory to bathroom without any problems 

## 2017-11-26 NOTE — ED Notes (Signed)
Pt's breakfast tray at bedside.

## 2017-11-26 NOTE — H&P (Signed)
History and Physical    Tasha [name] PXT:[id number] DOB: 10-19-1956 DOA: 11/25/2017  PCP: Nanci Pina, FNP   Patient coming from: Home  Chief Complaint: Abnormal lab, chest discomfort  HPI: Tasha [name] is a 61 y.o. female with medical history significant for hypertension, hyperlipidemia, and chronic atypical chest pain followed by cardiology, now presenting to the emergency department with intermittent chest discomfort and lab abnormality.  Patient has frequent brief episodes of chest pain and presented to the emergency department on 11/23/2017 for evaluation of this.  Labs were drawn, but the patient left prior to being seen.  Potassium was undetectably low at that time and she was notified of this by phone.  She has continued to have intermittent chest discomfort, described as sharp, fleeting, localized to the central chest, and without alleviating or exacerbating factors identified.  She reports increasing her potassium supplement from once daily to twice daily after being notified of her low level in the ED.  She reports some upper respiratory symptoms that began a month ago and have nearly resolved aside from a nagging dry cough.  Denies dyspnea.  ED Course: Upon arrival to the ED, patient is found to be afebrile, saturating well on room air, and with vitals otherwise stable.  EKG features a sinus rhythm with early R transition.  Chest x-ray is negative for acute cardiopulmonary disease.  Chemistry panel reveals a potassium of 2.3 and CBC is unremarkable.  Patient was given 324 mg of aspirin, 2 g IV magnesium, 40 mEq oral potassium, and 10 mEq IV potassium in the emergency department.  She remained hemodynamically stable and in apparent respiratory distress, and she will be observed on the telemetry unit for ongoing evaluation and management of hypokalemia and atypical chest discomfort.  Review of Systems:  All other systems reviewed and apart from HPI, are negative.  Past Medical History:    Diagnosis Date  . DDD (degenerative disc disease)    DJD.  spine.  chronic pain.   . Fibroids   . Hyperlipidemia   . Hypertension   . Pyelonephritis 2004  . Vertigo 2014   positional.  Eval with Dr Jannifer Franklin, neuro, 2014.     Past Surgical History:  Procedure Laterality Date  . CESAREAN SECTION    . CHOLECYSTECTOMY  2001  . ERCP W/ SPHICTEROTOMY  2001   removal CBD stones  . NECK SURGERY     cadaver bones put in her neck  . 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      reports that she quit smoking about 8 years ago. she has never used smokeless tobacco. She reports that she does not drink alcohol or use drugs.  Allergies  Allergen Reactions  . Antihistamines, Chlorpheniramine-Type Itching  . Hydrocodone Hives, Itching and Rash  . Sulfa Antibiotics Itching    Family History  Problem Relation Age of Onset  . Hypertension Mother   . Cancer Mother   . Hyperlipidemia Mother   . Pancreatic cancer Mother   . Heart disease Father   . Cancer Father   . Heart attack Father   . Rectal cancer Neg Hx   . Stomach cancer Neg Hx   . Colon cancer Neg Hx      Prior to Admission medications   Medication Sig Start Date End Date Taking? Authorizing Provider  aspirin EC 81 MG tablet Take 1 tablet (81 mg total) by mouth daily. 06/01/17  Yes Ledell Noss, MD  hydrochlorothiazide (HYDRODIURIL) 25 MG tablet Take 25 mg by mouth daily.  09/07/14  Yes [provider]  ibuprofen (ADVIL,MOTRIN) 200 MG tablet Take 200 mg by mouth every 6 (six) hours as needed (for pain or headaches).    Yes [provider]  naproxen sodium (ALEVE) 220 MG tablet Take 220 mg by mouth 2 (two) times daily as needed (for pain or headaches).    Yes [provider]  Pitavastatin Calcium (LIVALO) 4 MG TABS Take 1 tablet (4 mg total) by mouth daily. 10/07/17  Yes Lorretta Harp, MD  potassium chloride SA (K-DUR,KLOR-CON) 20 MEQ  tablet Take 20 mEq by mouth daily. 11/23/17  Yes [provider]  traMADol (ULTRAM) 50 MG tablet Take 50 mg by mouth every 6 (six) hours as needed for moderate pain.    Yes [provider]    Physical Exam: Vitals:   11/25/17 1827  BP: (!) 117/29  Pulse: 84  Resp: 18  Temp: 98 F (36.7 C)  TempSrc: Oral  SpO2: 98%      Constitutional: NAD, calm, comfortable Eyes: PERTLA, lids and conjunctivae normal ENMT: Mucous membranes are moist. Posterior pharynx clear of any exudate or lesions.   Neck: normal, supple, no masses, no thyromegaly Respiratory: clear to auscultation bilaterally, no wheezing, no crackles. Normal respiratory effort.   Cardiovascular: S1 & S2 heard, regular rate and rhythm. No extremity edema. No significant JVD. Abdomen: No distension, no tenderness, no masses palpated. Bowel sounds normal.  Musculoskeletal: no clubbing / cyanosis. No joint deformity upper and lower extremities.  Skin: no significant rashes, lesions, ulcers. Warm, dry, well-perfused. Neurologic: CN 2-12 grossly intact. Sensation intact. Strength 5/5 in all 4 limbs.  Psychiatric: Alert and oriented x 3. Pleasant, cooperative.     Labs on Admission: I have personally reviewed following labs and imaging studies  CBC: Recent Labs  Lab 11/23/17 1553 11/25/17 1840  WBC 8.7 7.1  NEUTROABS  --  4.6  HGB 12.7 12.4  HCT 36.1 36.0  MCV 85.3 86.3  PLT 277 063   Basic Metabolic Panel: Recent Labs  Lab 11/23/17 1553 11/25/17 1840  NA 139 137  K <2.0* 2.3*  CL 104 103  CO2 24 20*  GLUCOSE 120* 143*  BUN 28* 29*  CREATININE 1.27* 1.00  CALCIUM 9.5 9.4   GFR: Estimated Creatinine Clearance: 68.6 mL/min (by C-G formula based on SCr of 1 mg/dL). Liver Function Tests: Recent Labs  Lab 11/25/17 1840  AST 23  ALT 26  ALKPHOS 84  BILITOT 0.4  PROT 7.8  ALBUMIN 3.6   No results for input(s): LIPASE, AMYLASE in the last 168 hours. No results for input(s): AMMONIA in the  last 168 hours. Coagulation Profile: No results for input(s): INR, PROTIME in the last 168 hours. Cardiac Enzymes: No results for input(s): CKTOTAL, CKMB, CKMBINDEX, TROPONINI in the last 168 hours. BNP (last 3 results) No results for input(s): PROBNP in the last 8760 hours. HbA1C: No results for input(s): HGBA1C in the last 72 hours. CBG: No results for input(s): GLUCAP in the last 168 hours. Lipid Profile: No results for input(s): CHOL, HDL, LDLCALC, TRIG, CHOLHDL, LDLDIRECT in the last 72 hours. Thyroid Function Tests: No results for input(s): TSH, T4TOTAL, FREET4, T3FREE, THYROIDAB in the last 72 hours. Anemia Panel: No results for input(s): VITAMINB12, FOLATE, FERRITIN, TIBC, IRON, RETICCTPCT in the last 72 hours. Urine analysis:    Component Value Date/Time   COLORURINE YELLOW 07/26/2014 [place] 07/26/2014 1747  LABSPEC 1.013 07/26/2014 1747   PHURINE 7.0 07/26/2014 1747   GLUCOSEU NEGATIVE 07/26/2014 1747   HGBUR NEGATIVE 07/26/2014 1747   BILIRUBINUR NEGATIVE 07/26/2014 1747   KETONESUR NEGATIVE 07/26/2014 1747   PROTEINUR NEGATIVE 07/26/2014 1747   UROBILINOGEN 0.2 07/26/2014 1747   NITRITE NEGATIVE 07/26/2014 1747   LEUKOCYTESUR NEGATIVE 07/26/2014 1747   Sepsis Labs: @LABRCNTIP (procalcitonin:4,lacticidven:4) )No results found for this or any previous visit (from the past 240 hour(s)).   Radiological Exams on Admission: Dg Chest 2 View  Result Date: 11/25/2017 CLINICAL DATA:  61 year old female with cough and chest pain. EXAM: CHEST  2 VIEW COMPARISON:  Chest radiograph dated 11/23/2017 FINDINGS: The lungs are clear. There is no pleural effusion or pneumothorax. The cardiac silhouette is within normal limits. Cervical fusion plate and screws noted. No acute osseous pathology. Cholecystectomy clips. IMPRESSION: No active cardiopulmonary disease. Electronically Signed   By: Anner Crete M.D.   On: 11/25/2017 23:42    EKG: Independently  reviewed. Sinus rhythm, early R-transition.   Assessment/Plan  1. Hypokalemia  - Pt presents after being notified of serum potassium "<2.0" on labs drawn in ED on 12/3 when she presented with chest pain but left prior to being seen  - Potassium is 2.3 on admission and pt reports increasing her 20 mEq potassium supplement to BID  - Most likely secondary to HCTZ, will hold   - Treated in ED with 40 mEq oral potassium, 10 mEq IV potassium, and 2 g IV magnesium - Plan to hold HCTZ, continue cardiac monitoring, continue 20 mEq oral potassium BID, add KCl to IVF, repeat chemistries in am   2. Atypical chest pain - Pt reports occasional, brief, sharp central chest discomfort without alleviating or exacerbating factors  - She has long hx of this, follows with cardiology, had normal Myoview July '17, unremarkable event-monitor June '17, and ruled-out for ACS during hospital admission in June '18  - No pain at time of admission; CXR unremarkable and no ischemic features noted on EKG  - Treated in ED with ASA 324 mg  - Plan to continue cardiac monitoring, check serial troponin measurements, repeat EKG, continue statin and ASA    3. Hypertension  - BP at goal  - Holding HCTZ as above   4. Hyperlipidemia  - Continue statin    DVT prophylaxis: Lovenox Code Status: Full  Family Communication: Discussed with patient Disposition Plan: Observe on telemetry Consults called: None Admission status: Observation    Vianne Bulls, MD [facility] Pager [phone]  If 7PM-7AM, please contact night-coverage [email or web address] Password [email or web address]  11/26/2017, 1:44 AM

## 2017-11-26 NOTE — ED Notes (Signed)
Attempted to call report

## 2017-11-26 NOTE — Progress Notes (Signed)
Patient arrived to 4E room 11 from the ED.  Patient was oriented to the unit and room to include call light and phone.  Telemetry monitor applied and CCMD notified.  Will continue to monitor.

## 2017-11-26 NOTE — Progress Notes (Signed)
Patient admitted after midnight, please see H&P.  Here with hypokalemia-- suspect due to HCTZ-- have stopped.  Will replace PO.  IF continues to have issues off HCTZ, will need further work up by PCP.  BMP ordered for this PM.  Eulogio Bear DO

## 2017-11-26 NOTE — ED Notes (Signed)
Assisted pt to restroom, unknown volume voided. Assisted back to room and hooked back to cardiac monitor.

## 2017-11-27 DIAGNOSIS — E876 Hypokalemia: Secondary | ICD-10-CM | POA: Diagnosis not present

## 2017-11-27 LAB — BASIC METABOLIC PANEL
Anion gap: 6 (ref 5–15)
Anion gap: 6 (ref 5–15)
BUN: 16 mg/dL (ref 6–20)
BUN: 16 mg/dL (ref 6–20)
CHLORIDE: 117 mmol/L — AB (ref 101–111)
CHLORIDE: 117 mmol/L — AB (ref 101–111)
CO2: 16 mmol/L — ABNORMAL LOW (ref 22–32)
CO2: 16 mmol/L — ABNORMAL LOW (ref 22–32)
CREATININE: 0.64 mg/dL (ref 0.44–1.00)
Calcium: 8.7 mg/dL — ABNORMAL LOW (ref 8.9–10.3)
Calcium: 8.4 mg/dL — ABNORMAL LOW (ref 8.9–10.3)
Creatinine, Ser: 0.75 mg/dL (ref 0.44–1.00)
GFR calc Af Amer: >60 mL/min (ref >60)
GFR calc Af Amer: >60 mL/min (ref >60)
GFR calc non Af Amer: >60 mL/min (ref >60)
GLUCOSE: 100 mg/dL — AB (ref 65–99)
GLUCOSE: 123 mg/dL — AB (ref 65–99)
POTASSIUM: 3.7 mmol/L (ref 3.5–5.1)
POTASSIUM: 3.7 mmol/L (ref 3.5–5.1)
Sodium: 139 mmol/L (ref 135–145)
Sodium: 139 mmol/L (ref 135–145)

## 2017-11-27 MED ORDER — SODIUM CHLORIDE 0.9 % IV BOLUS (SEPSIS)
1000.0000 mL | Freq: Once | INTRAVENOUS | Status: AC
  Administered 2017-11-27: 1000 mL via INTRAVENOUS

## 2017-11-27 MED ORDER — SODIUM BICARBONATE 650 MG PO TABS: 650.0000 mg | ORAL_TABLET | Freq: Two times a day (BID) | ORAL | Status: DC

## 2017-11-27 MED ORDER — SODIUM BICARBONATE 650 MG PO TABS: 1300.0000 mg | ORAL_TABLET | Freq: Two times a day (BID) | ORAL | Status: DC

## 2017-11-27 MED ORDER — POTASSIUM CHLORIDE CRYS ER 20 MEQ PO TBCR
20.0000 meq | EXTENDED_RELEASE_TABLET | Freq: Once | ORAL | Status: AC
  Administered 2017-11-27: 20 meq via ORAL
  Filled 2017-11-27: qty 1

## 2017-11-27 MED ORDER — SODIUM BICARBONATE 650 MG PO TABS: 1300.0000 mg | ORAL_TABLET | Freq: Two times a day (BID) | ORAL | 0 refills | Status: DC

## 2017-11-27 MED ORDER — SODIUM CHLORIDE 0.9 % IV SOLN
INTRAVENOUS | Status: DC
  Administered 2017-11-27: 11:00:00 via INTRAVENOUS

## 2017-11-27 MED ORDER — POTASSIUM CHLORIDE CRYS ER 20 MEQ PO TBCR: 20.0000 meq | EXTENDED_RELEASE_TABLET | Freq: Every day | ORAL | 0 refills | Status: DC

## 2017-11-27 NOTE — Progress Notes (Signed)
Pt given discharge instructions. Prescriptions given. IV removed, tele removed. VSS. Given instructions on when to call doctor for follow up. Pt insists on walking to ER herself, will drive herself home.  Clyde Canterbury, RN

## 2017-11-27 NOTE — Discharge Summary (Signed)
Physician Discharge Summary  Tasha [name] OAC:[id number] DOB: 22-Aug-1956 DOA: 11/25/2017  PCP: Nanci Pina, FNP  Admit date: 11/25/2017 Discharge date: 11/27/2017  Admitted From: home  Disposition:  Home   Recommendations for Outpatient Follow-up:  1. Follow up with PCP in 1-2 weeks 2. Please obtain BMP/CBC in one week 3. Needs Bmet in 48 hours to follow bicarb   Discharge Condition: stable.  CODE STATUS:* full code.  Diet recommendation: Heart Healthy  Brief/Interim Summary:  Tasha [name] a 61 y.o.femalewith medical history significant forhypertension, hyperlipidemia, and chronic atypical chest pain followed by cardiology, now presenting to the emergency department with intermittent chest discomfort and lab abnormality. Patient has frequent brief episodes of chest pain and presented to the emergency department on 11/23/2017 for evaluation of this. Labs were drawn, but the patient left prior to being seen. Potassium was undetectably low at that time and she was notified of this by phone. She has continued to have intermittent chest discomfort, described as sharp, fleeting, localized to the central chest, and without alleviating or exacerbating factors identified. She reports increasing her potassium supplement from once daily to twice daily after being notified of her low level in the ED. She reports some upper respiratory symptoms that began a month ago and have nearly resolved aside from a nagging dry cough. Denies dyspnea.  ED Course:Upon arrival to the ED, patient is found to beafebrile, saturating well on room air, and with vitals otherwise stable. EKG features a sinus rhythm with early R transition. Chest x-ray is negative for acute cardiopulmonary disease. Chemistry panel reveals a potassium of 2.3 and CBC is unremarkable. Patient was given 324 mg of aspirin, 2 g IV magnesium, 40 mEq oral potassium, and 10 mEq IV potassium in the emergency department. She remained  hemodynamically stable and in apparent respiratory distress, and she will be observed on the telemetry unit for ongoing evaluation and management of hypokalemia and atypical chest discomfort.     Assessment & Plan:   Principal Problem:   Hypokalemia Active Problems:   Left-sided carotid artery disease (HCC)   Essential hypertension   Hyperlipidemia   Chest pain  1-Hypokalemia; severe;  Related to HCTZ.  Stop HCTZ.  Discharge on daily potasium  2-chest pain; she is not complaining of chest pain.  3-HTN; avoid HCTZ> stable.   4-Metabolic acidosis; Bicarb at 16. Received IV fluids.  Started on bicarb tablet. She denies diarrhea.  She is asymptomatic metabolic acidosis could be related to potasium loss.     Discharge Diagnoses:  Principal Problem:   Hypokalemia Active Problems:   Left-sided carotid artery disease (HCC)   Essential hypertension   Hyperlipidemia   Chest pain    Discharge Instructions  Discharge Instructions    Diet - low sodium heart healthy   Complete by:  As directed    Increase activity slowly   Complete by:  As directed      Allergies as of 11/27/2017      Reactions   Antihistamines, Chlorpheniramine-type Itching   Hydrocodone Hives, Itching, Rash   Sulfa Antibiotics Itching      Medication List    STOP taking these medications   hydrochlorothiazide 25 MG tablet Commonly known as:  HYDRODIURIL   ibuprofen 200 MG tablet Commonly known as:  ADVIL,MOTRIN     TAKE these medications   aspirin EC 81 MG tablet Take 1 tablet (81 mg total) by mouth daily.   naproxen sodium 220 MG tablet Commonly known as:  ALEVE Take 220 mg  by mouth 2 (two) times daily as needed (for pain or headaches).   Pitavastatin Calcium 4 MG Tabs Commonly known as:  LIVALO Take 1 tablet (4 mg total) by mouth daily.   potassium chloride SA 20 MEQ tablet Commonly known as:  K-DUR,KLOR-CON Take 1 tablet (20 mEq total) by mouth daily.   sodium  bicarbonate 650 MG tablet Take 2 tablets (1,300 mg total) by mouth 2 (two) times daily.   traMADol 50 MG tablet Commonly known as:  ULTRAM Take 50 mg by mouth every 6 (six) hours as needed for moderate pain.      Follow-up Information    Nanci Pina, FNP Follow up in 2 day(s).   Specialties:  Nurse Practitioner, Psychiatry Contact information: [address] [place] [address] [phone]          Allergies  Allergen Reactions  . Antihistamines, Chlorpheniramine-Type Itching  . Hydrocodone Hives, Itching and Rash  . Sulfa Antibiotics Itching    Consultations:  none   Procedures/Studies: Dg Chest 2 View  Result Date: 11/25/2017 CLINICAL DATA:  61 year old female with cough and chest pain. EXAM: CHEST  2 VIEW COMPARISON:  Chest radiograph dated 11/23/2017 FINDINGS: The lungs are clear. There is no pleural effusion or pneumothorax. The cardiac silhouette is within normal limits. Cervical fusion plate and screws noted. No acute osseous pathology. Cholecystectomy clips. IMPRESSION: No active cardiopulmonary disease. Electronically Signed   By: Anner Crete M.D.   On: 11/25/2017 23:42   Dg Chest 2 View  Result Date: 11/23/2017 CLINICAL DATA:  3-4 day history of chest pain. Shortness of breath with slight cough. EXAM: CHEST  2 VIEW COMPARISON:  05/31/2017 FINDINGS: The lungs are clear without focal pneumonia, edema, pneumothorax or pleural effusion. The cardiopericardial silhouette is within normal limits for size. The visualized bony structures of the thorax are intact. Patient is status post lower cervical fusion IMPRESSION: No active cardiopulmonary disease. Electronically Signed   By: Misty Stanley M.D.   On: 11/23/2017 16:49     Subjective: She is alert, denies diarrhea.   Discharge Exam: Vitals:   11/26/17 1940 11/27/17 0543  BP: 118/79 113/63  Pulse: 76 71  Resp: 16 18  Temp: 97.7 F (36.5 C) 97.8 F (36.6 C)  SpO2: 100% 100%   Vitals:    11/26/17 1000 11/26/17 1049 11/26/17 1940 11/27/17 0543  BP: 127/87 (!) 109/93 118/79 113/63  Pulse: 62 77 76 71  Resp: 18 17 16 18   Temp:  (!) 97.5 F (36.4 C) 97.7 F (36.5 C) 97.8 F (36.6 C)  TempSrc:  Oral Oral Oral  SpO2: 100% 100% 100% 100%  Weight:  98.3 kg (216 lb 11.4 oz)    Height:  5\' 5"  (1.651 m)      General: Pt is alert, awake, not in acute distress Cardiovascular: RRR, S1/S2 +, no rubs, no gallops Respiratory: CTA bilaterally, no wheezing, no rhonchi Abdominal: Soft, NT, ND, bowel sounds + Extremities: no edema, no cyanosis    The results of significant diagnostics from this hospitalization (including imaging, microbiology, ancillary and laboratory) are listed below for reference.     Microbiology: No results found for this or any previous visit (from the past 240 hour(s)).   Labs: BNP (last 3 results) No results for input(s): BNP in the last 8760 hours. Basic Metabolic Panel: Recent Labs  Lab 11/25/17 1840 11/26/17 0443 11/26/17 2004 11/27/17 0922 11/27/17 1347  NA 137 137 140 139 139  K 2.3* 2.4* 3.7 3.7 3.7  CL 103 106 115* 117* 117*  CO2 20* 20* 20* 16* 16*  GLUCOSE 143* 131* 106* 100* 123*  BUN 29* 25* 18 16 16   CREATININE 1.00 0.75 0.73 0.64 0.75  CALCIUM 9.4 8.8* 8.5* 8.7* 8.4*  MG  --  2.5*  --   --   --    Liver Function Tests: Recent Labs  Lab 11/25/17 1840  AST 23  ALT 26  ALKPHOS 84  BILITOT 0.4  PROT 7.8  ALBUMIN 3.6   No results for input(s): LIPASE, AMYLASE in the last 168 hours. No results for input(s): AMMONIA in the last 168 hours. CBC: Recent Labs  Lab 11/23/17 1553 11/25/17 1840  WBC 8.7 7.1  NEUTROABS  --  4.6  HGB 12.7 12.4  HCT 36.1 36.0  MCV 85.3 86.3  PLT 277 235   Cardiac Enzymes: Recent Labs  Lab 11/26/17 0325 11/26/17 0443 11/26/17 1313  TROPONINI 0.03* 0.03* 0.03*   BNP: Invalid input(s): POCBNP CBG: No results for input(s): GLUCAP in the last 168 hours. D-Dimer No results for input(s):  DDIMER in the last 72 hours. Hgb A1c No results for input(s): HGBA1C in the last 72 hours. Lipid Profile No results for input(s): CHOL, HDL, LDLCALC, TRIG, CHOLHDL, LDLDIRECT in the last 72 hours. Thyroid function studies No results for input(s): TSH, T4TOTAL, T3FREE, THYROIDAB in the last 72 hours.  Invalid input(s): FREET3 Anemia work up No results for input(s): VITAMINB12, FOLATE, FERRITIN, TIBC, IRON, RETICCTPCT in the last 72 hours. Urinalysis    Component Value Date/Time   COLORURINE YELLOW 07/26/2014 1747   APPEARANCEUR CLEAR 07/26/2014 1747   LABSPEC 1.013 07/26/2014 1747   PHURINE 7.0 07/26/2014 1747   GLUCOSEU NEGATIVE 07/26/2014 1747   HGBUR NEGATIVE 07/26/2014 1747   BILIRUBINUR NEGATIVE 07/26/2014 1747   KETONESUR NEGATIVE 07/26/2014 1747   PROTEINUR NEGATIVE 07/26/2014 1747   UROBILINOGEN 0.2 07/26/2014 1747   NITRITE NEGATIVE 07/26/2014 1747   LEUKOCYTESUR NEGATIVE 07/26/2014 1747   Sepsis Labs Invalid input(s): PROCALCITONIN,  WBC,  LACTICIDVEN Microbiology No results found for this or any previous visit (from the past 240 hour(s)).   Time coordinating discharge: Over 30 minutes  SIGNED:   Elmarie Shiley, MD  [facility] 11/27/2017, 3:42 PM Pager   If 7PM-7AM, please contact night-coverage [email or web address] Password [email or web address]

## 2017-11-27 NOTE — Progress Notes (Signed)
PROGRESS NOTE    Tasha [name]  BOF:[id number] DOB: February 06, 1956 DOA: 11/25/2017 PCP: Tasha Pina, FNP    Brief Narrative: Tasha [name] is a 61 y.o. female with medical history significant for hypertension, hyperlipidemia, and chronic atypical chest pain followed by cardiology, now presenting to the emergency department with intermittent chest discomfort and lab abnormality.  Patient has frequent brief episodes of chest pain and presented to the emergency department on 11/23/2017 for evaluation of this.  Labs were drawn, but the patient left prior to being seen.  Potassium was undetectably low at that time and she was notified of this by phone.  She has continued to have intermittent chest discomfort, described as sharp, fleeting, localized to the central chest, and without alleviating or exacerbating factors identified.  She reports increasing her potassium supplement from once daily to twice daily after being notified of her low level in the ED.  She reports some upper respiratory symptoms that began a month ago and have nearly resolved aside from a nagging dry cough.  Denies dyspnea.  ED Course: Upon arrival to the ED, patient is found to be afebrile, saturating well on room air, and with vitals otherwise stable.  EKG features a sinus rhythm with early R transition.  Chest x-ray is negative for acute cardiopulmonary disease.  Chemistry panel reveals a potassium of 2.3 and CBC is unremarkable.  Patient was given 324 mg of aspirin, 2 g IV magnesium, 40 mEq oral potassium, and 10 mEq IV potassium in the emergency department.  She remained hemodynamically stable and in apparent respiratory distress, and she will be observed on the telemetry unit for ongoing evaluation and management of hypokalemia and atypical chest discomfort.     Assessment & Plan:   Principal Problem:   Hypokalemia Active Problems:   Left-sided carotid artery disease (HCC)   Essential hypertension   Hyperlipidemia   Chest  pain  1-Hypokalemia; severe;  Related to HCTZ.  Stop HCTZ.   2-chest pain; she is not complaining of chest pain.  HTN; avoid HCTZ> stable.   3-Metabolic acidosis; Bicarb at 16. Will continue with IV fluids. Bicarb table.      DVT prophylaxis: lovenox Code Status: full code.  Family Communication: care discussed with patient.  Disposition Plan: remain inpatient for IV fluids.   Consultants:   none   Procedures:none   Antimicrobials:none   Subjective: She is feeling ok. Denies diarrhea. Or vomiting.   Objective: Vitals:   11/26/17 1000 11/26/17 1049 11/26/17 1940 11/27/17 0543  BP: 127/87 (!) 109/93 118/79 113/63  Pulse: 62 77 76 71  Resp: 18 17 16 18   Temp:  (!) 97.5 F (36.4 C) 97.7 F (36.5 C) 97.8 F (36.6 C)  TempSrc:  Oral Oral Oral  SpO2: 100% 100% 100% 100%  Weight:  98.3 kg (216 lb 11.4 oz)    Height:  5\' 5"  (1.651 m)      Intake/Output Summary (Last 24 hours) at 11/27/2017 1447 Last data filed at 11/27/2017 0858 Gross per 24 hour  Intake 240 ml  Output -  Net 240 ml   Filed Weights   11/26/17 1049  Weight: 98.3 kg (216 lb 11.4 oz)    Examination:  General exam: Appears calm and comfortable  Respiratory system: Clear to auscultation. Respiratory effort normal. Cardiovascular system: S1 & S2 heard, RRR. No JVD, murmurs, rubs, gallops or clicks. No pedal edema. Gastrointestinal system: Abdomen is nondistended, soft and nontender. No organomegaly or masses felt. Normal bowel sounds heard. Central nervous  system: Alert and oriented. No focal neurological deficits. Extremities: Symmetric 5 x 5 power. Skin: No rashes, lesions or ulcers Psychiatry: Judgement and insight appear normal. Mood & affect appropriate.     Data Reviewed: I have personally reviewed following labs and imaging studies  CBC: Recent Labs  Lab 11/23/17 1553 11/25/17 1840  WBC 8.7 7.1  NEUTROABS  --  4.6  HGB 12.7 12.4  HCT 36.1 36.0  MCV 85.3 86.3  PLT 277 546    Basic Metabolic Panel: Recent Labs  Lab 11/25/17 1840 11/26/17 0443 11/26/17 2004 11/27/17 0922 11/27/17 1347  NA 137 137 140 139 139  K 2.3* 2.4* 3.7 3.7 3.7  CL 103 106 115* 117* 117*  CO2 20* 20* 20* 16* 16*  GLUCOSE 143* 131* 106* 100* 123*  BUN 29* 25* 18 16 16   CREATININE 1.00 0.75 0.73 0.64 0.75  CALCIUM 9.4 8.8* 8.5* 8.7* 8.4*  MG  --  2.5*  --   --   --    GFR: Estimated Creatinine Clearance: 85.7 mL/min (by C-G formula based on SCr of 0.75 mg/dL). Liver Function Tests: Recent Labs  Lab 11/25/17 1840  AST 23  ALT 26  ALKPHOS 84  BILITOT 0.4  PROT 7.8  ALBUMIN 3.6   No results for input(s): LIPASE, AMYLASE in the last 168 hours. No results for input(s): AMMONIA in the last 168 hours. Coagulation Profile: No results for input(s): INR, PROTIME in the last 168 hours. Cardiac Enzymes: Recent Labs  Lab 11/26/17 0325 11/26/17 0443 11/26/17 1313  TROPONINI 0.03* 0.03* 0.03*   BNP (last 3 results) No results for input(s): PROBNP in the last 8760 hours. HbA1C: No results for input(s): HGBA1C in the last 72 hours. CBG: No results for input(s): GLUCAP in the last 168 hours. Lipid Profile: No results for input(s): CHOL, HDL, LDLCALC, TRIG, CHOLHDL, LDLDIRECT in the last 72 hours. Thyroid Function Tests: No results for input(s): TSH, T4TOTAL, FREET4, T3FREE, THYROIDAB in the last 72 hours. Anemia Panel: No results for input(s): VITAMINB12, FOLATE, FERRITIN, TIBC, IRON, RETICCTPCT in the last 72 hours. Sepsis Labs: No results for input(s): PROCALCITON, LATICACIDVEN in the last 168 hours.  No results found for this or any previous visit (from the past 240 hour(s)).       Radiology Studies: Dg Chest 2 View  Result Date: 11/25/2017 CLINICAL DATA:  61 year old female with cough and chest pain. EXAM: CHEST  2 VIEW COMPARISON:  Chest radiograph dated 11/23/2017 FINDINGS: The lungs are clear. There is no pleural effusion or pneumothorax. The cardiac  silhouette is within normal limits. Cervical fusion plate and screws noted. No acute osseous pathology. Cholecystectomy clips. IMPRESSION: No active cardiopulmonary disease. Electronically Signed   By: Anner Crete M.D.   On: 11/25/2017 23:42        Scheduled Meds: . aspirin EC  81 mg Oral Daily  . enoxaparin (LOVENOX) injection  40 mg Subcutaneous Daily  . pravastatin  80 mg Oral q1800  . sodium bicarbonate  1,300 mg Oral BID  . sodium chloride flush  3 mL Intravenous Q12H   Continuous Infusions: . sodium chloride 125 mL/hr at 11/27/17 1110     LOS: 0 days    Time spent: 35 minutes.     Elmarie Shiley, MD [facility] Pager [phone]  If 7PM-7AM, please contact night-coverage [email or web address] Password [facility] 11/27/2017, 2:47 PM

## 2017-12-03 ENCOUNTER — Inpatient Hospital Stay: Admission: RE | Admit: 2017-12-03 | Payer: BLUE CROSS/BLUE SHIELD | Source: Ambulatory Visit

## 2017-12-29 ENCOUNTER — Other Ambulatory Visit: Payer: Self-pay | Admitting: Family

## 2017-12-29 DIAGNOSIS — Z1231 Encounter for screening mammogram for malignant neoplasm of breast: Secondary | ICD-10-CM

## 2018-01-19 ENCOUNTER — Ambulatory Visit: Payer: BLUE CROSS/BLUE SHIELD

## 2018-02-03 ENCOUNTER — Encounter (HOSPITAL_COMMUNITY): Payer: Self-pay

## 2018-02-03 ENCOUNTER — Other Ambulatory Visit: Payer: Self-pay

## 2018-02-03 ENCOUNTER — Emergency Department (HOSPITAL_COMMUNITY)
Admission: EM | Admit: 2018-02-03 | Discharge: 2018-02-04 | Disposition: A | Discharge status: Home / Self Care | Payer: BLUE CROSS/BLUE SHIELD | Attending: Emergency Medicine | Admitting: Emergency Medicine

## 2018-02-03 DIAGNOSIS — Z5321 Procedure and treatment not carried out due to patient leaving prior to being seen by health care provider: Secondary | ICD-10-CM | POA: Insufficient documentation

## 2018-02-03 DIAGNOSIS — R030 Elevated blood-pressure reading, without diagnosis of hypertension: Secondary | ICD-10-CM | POA: Diagnosis not present

## 2018-02-03 NOTE — ED Notes (Signed)
Pt did not answer x 2 

## 2018-02-03 NOTE — ED Triage Notes (Signed)
Pt states that her Bp was high today. Pt reports that she is going to see her doctor tomorrow with recent change in medications

## 2018-02-03 NOTE — ED Notes (Signed)
No response when called for room x 2.

## 2018-02-05 ENCOUNTER — Ambulatory Visit
Admission: RE | Admit: 2018-02-05 | Discharge: 2018-02-05 | Disposition: A | Discharge status: Home / Self Care | Payer: BLUE CROSS/BLUE SHIELD | Source: Ambulatory Visit | Attending: Family | Admitting: Family

## 2018-02-05 DIAGNOSIS — Z1231 Encounter for screening mammogram for malignant neoplasm of breast: Secondary | ICD-10-CM

## 2018-02-05 NOTE — ED Notes (Signed)
02/05/2018, Attempted follow-up call.  No answer

## 2018-02-08 ENCOUNTER — Emergency Department (HOSPITAL_COMMUNITY): Payer: BLUE CROSS/BLUE SHIELD

## 2018-02-08 ENCOUNTER — Other Ambulatory Visit: Payer: Self-pay

## 2018-02-08 ENCOUNTER — Emergency Department (HOSPITAL_COMMUNITY)
Admission: EM | Admit: 2018-02-08 | Discharge: 2018-02-08 | Disposition: A | Discharge status: Home / Self Care | Payer: BLUE CROSS/BLUE SHIELD | Attending: Emergency Medicine | Admitting: Emergency Medicine

## 2018-02-08 ENCOUNTER — Encounter (HOSPITAL_COMMUNITY): Payer: Self-pay | Admitting: Emergency Medicine

## 2018-02-08 DIAGNOSIS — I10 Essential (primary) hypertension: Secondary | ICD-10-CM | POA: Diagnosis not present

## 2018-02-08 DIAGNOSIS — D334 Benign neoplasm of spinal cord: Secondary | ICD-10-CM | POA: Diagnosis not present

## 2018-02-08 DIAGNOSIS — Z79899 Other long term (current) drug therapy: Secondary | ICD-10-CM | POA: Diagnosis not present

## 2018-02-08 DIAGNOSIS — R142 Eructation: Secondary | ICD-10-CM | POA: Insufficient documentation

## 2018-02-08 DIAGNOSIS — M25512 Pain in left shoulder: Secondary | ICD-10-CM | POA: Insufficient documentation

## 2018-02-08 DIAGNOSIS — Z87891 Personal history of nicotine dependence: Secondary | ICD-10-CM | POA: Insufficient documentation

## 2018-02-08 DIAGNOSIS — R079 Chest pain, unspecified: Secondary | ICD-10-CM

## 2018-02-08 DIAGNOSIS — Z9114 Patient's other noncompliance with medication regimen: Secondary | ICD-10-CM | POA: Insufficient documentation

## 2018-02-08 DIAGNOSIS — G8929 Other chronic pain: Secondary | ICD-10-CM | POA: Diagnosis not present

## 2018-02-08 DIAGNOSIS — Z7982 Long term (current) use of aspirin: Secondary | ICD-10-CM | POA: Diagnosis not present

## 2018-02-08 LAB — COMPREHENSIVE METABOLIC PANEL
ALT: 16 U/L (ref 14–54)
ANION GAP: 7 (ref 5–15)
AST: 17 U/L (ref 15–41)
Albumin: 3.6 g/dL (ref 3.5–5.0)
Alkaline Phosphatase: 75 U/L (ref 38–126)
BUN: 16 mg/dL (ref 6–20)
CHLORIDE: 112 mmol/L — AB (ref 101–111)
CO2: 21 mmol/L — ABNORMAL LOW (ref 22–32)
Calcium: 9.1 mg/dL (ref 8.9–10.3)
Creatinine, Ser: 0.92 mg/dL (ref 0.44–1.00)
Glucose, Bld: 104 mg/dL — ABNORMAL HIGH (ref 65–99)
POTASSIUM: 3.5 mmol/L (ref 3.5–5.1)
Sodium: 140 mmol/L (ref 135–145)
TOTAL PROTEIN: 6.9 g/dL (ref 6.5–8.1)
Total Bilirubin: 0.5 mg/dL (ref 0.3–1.2)

## 2018-02-08 LAB — I-STAT TROPONIN, ED
TROPONIN I, POC: 0 ng/mL (ref 0.00–0.08)
Troponin i, poc: 0.01 ng/mL (ref 0.00–0.08)

## 2018-02-08 LAB — CBC
HCT: 33.4 % — ABNORMAL LOW (ref 36.0–46.0)
Hemoglobin: 11.2 g/dL — ABNORMAL LOW (ref 12.0–15.0)
MCH: 29.7 pg (ref 26.0–34.0)
MCHC: 33.5 g/dL (ref 30.0–36.0)
MCV: 88.6 fL (ref 78.0–100.0)
PLATELETS: 237 10*3/uL (ref 150–400)
RBC: 3.77 MIL/uL — ABNORMAL LOW (ref 3.87–5.11)
RDW: 15.1 % (ref 11.5–15.5)
WBC: 5 10*3/uL (ref 4.0–10.5)

## 2018-02-08 MED ORDER — FAMOTIDINE 20 MG PO TABS: 20.0000 mg | ORAL_TABLET | Freq: Two times a day (BID) | ORAL | 0 refills | Status: DC

## 2018-02-08 NOTE — Discharge Instructions (Signed)
Take Pepcid for possible gastritis or acid reflux as prescribed.  Avoid any spicy or tomato based foods.  Avoid taking excessive amount of ibuprofen or Aleve.  Do not drink alcohol.  Please follow-up with family doctor regarding this chest pain.  Please follow-up with orthopedics for your shoulder pain.  You refused to wait for results of your second blood draw, if your blood work returns abnormal I will call you.

## 2018-02-08 NOTE — ED Notes (Signed)
Pt ambulated from lobby without wheelchair per pt.

## 2018-02-08 NOTE — ED Notes (Signed)
Pt received discharge papers/instructions and denied any further requests/questions. Pt refused to sign and said she had to leave.

## 2018-02-08 NOTE — ED Triage Notes (Signed)
Pt to ED with c/o chest pain that started 2 days ago-- states "normally happens when my potassium is low" pt alert/ w/d-

## 2018-02-08 NOTE — ED Provider Notes (Signed)
Tasha [name] Provider Note   CSN: [id number] Arrival date & time: 02/08/18  1240     History   Chief Complaint Chief Complaint  Patient presents with  . Chest Pain    HPI Tasha [name] is a 62 y.o. female.  HPI Tasha [name] is a 62 y.o. female with hypertension, hyperlipidemia, presents to emergency [name] complaining of central chest pain.  Patient states pain has been there for a week.  She states pain is worse at nighttime and after eating.  She denies any palpitations, shortness of breath, abdominal pain, nausea, vomiting.  She denies any dizziness or lightheadedness.  She states last time she had similar pain she was told her potassium was low.  She states that she sometimes skips her blood pressure medications because she knows that is what is making her potassium low, states she has not taken her blood pressure medicines in the last several days.  She denies any pain at this time.  She has not tried any medications to help her with this pain.  She is also complaining of chronic pain to the left shoulder for which she has seen a primary care doctor and sports medicine physician and has had intra-articular injections done.  She states none of these things are helping and she would like a prescription for pain medication.  She denies any new injuries to the shoulder  Past Medical History:  Diagnosis Date  . DDD (degenerative disc disease)    DJD.  spine.  chronic pain.   . Fibroids   . Hyperlipidemia   . Hypertension   . Hypokalemia 11/26/2017  . Pyelonephritis 2004  . Vertigo 2014   positional.  Eval with Dr Jannifer Franklin, neuro, 2014.     Patient Active Problem List   Diagnosis Date Noted  . Acute pain of right shoulder 08/20/2016  . Left-sided carotid artery disease ([place]) 06/17/2016  . Essential hypertension 06/17/2016  . Hyperlipidemia 06/17/2016  . Chest pain 06/17/2016  . Palpitations 06/17/2016  . 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 Hypokalemia 07/26/2014  . Constipation 07/26/2014  . BRBPR (bright red blood per rectum) 07/26/2014  . Hematochezia 07/26/2014  . Benign paroxysmal positional vertigo 05/12/2014  . Dizziness and giddiness 09/27/2013  . Uterine leiomyoma 03/03/2013  . Postmenopausal bleeding 03/03/2013  . Pelvic pain 03/03/2013  . 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 Low back pain 10/23/2011    Past Surgical History:  Procedure Laterality Date  . CESAREAN SECTION    . CHOLECYSTECTOMY  2001  . ERCP W/ SPHICTEROTOMY  2001   removal CBD stones  . FOOT SURGERY Right 2017  . NECK SURGERY     cadaver bones put in her neck  . 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     OB History    Gravida Para Term Preterm AB Living   4 1 1   3 1    SAB TAB Ectopic Multiple Live Births   2       1       Home Medications    Prior to Admission medications   Medication Sig Start Date End Date Taking? Authorizing Provider  aspirin EC 81 MG tablet Take 1 tablet (81 mg total) by mouth daily. 06/01/17   Ledell Noss, MD  naproxen sodium (ALEVE) 220 MG tablet Take 220 mg by mouth 2 (two) times  daily as needed (for pain or headaches).     [provider]  Pitavastatin Calcium (LIVALO) 4 MG TABS Take 1 tablet (4 mg total) by mouth daily. 10/07/17   Lorretta Harp, MD  potassium chloride SA (K-DUR,KLOR-CON) 20 MEQ tablet Take 1 tablet (20 mEq total) by mouth daily. 11/27/17   Regalado, Belkys A, MD  sodium bicarbonate 650 MG tablet Take 2 tablets (1,300 mg total) by mouth 2 (two) times daily. 11/27/17   Regalado, Belkys A, MD  traMADol (ULTRAM) 50 MG tablet Take 50 mg by mouth every 6 (six) hours as needed for moderate pain.     [provider]    Family History Family History  Problem Relation Age of Onset  . Hypertension Mother   . Cancer Mother   .  Hyperlipidemia Mother   . Pancreatic cancer Mother   . Heart disease Father   . Cancer Father   . Heart attack Father   . Rectal cancer Neg Hx   . Stomach cancer Neg Hx   . Colon cancer Neg Hx     Social History Social History   Tobacco Use  . Smoking status: Former Smoker    Last attempt to quit: 12/22/2008    Years since quitting: 9.1  . Smokeless tobacco: Never Used  Substance Use Topics  . 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 Drug use: No    Comment: took this am     Allergies   Antihistamines, chlorpheniramine-type; Hydrocodone; and Sulfa antibiotics   Review of Systems Review of Systems  Constitutional: Negative for chills and fever.  Respiratory: Positive for chest tightness. Negative for cough and shortness of breath.   Cardiovascular: Positive for chest pain. Negative for palpitations and leg swelling.  Gastrointestinal: Negative for abdominal pain, diarrhea, nausea and vomiting.  Genitourinary: Negative for dysuria, flank pain and pelvic pain.  Musculoskeletal: Positive for arthralgias. Negative for myalgias, neck pain and neck stiffness.  Skin: Negative for rash.  Neurological: Negative for dizziness, weakness and headaches.  All other systems reviewed and are negative.    Physical Exam Updated Vital Signs BP 117/82 (BP Location: Right Arm)   Pulse 71   Temp 97.9 F (36.6 C) (Oral)   Resp 18   Ht 5\' 5"  (1.651 m)   Wt 94.8 kg (209 lb)   LMP 11/03/2012   SpO2 100%   BMI 34.78 kg/m   Physical Exam  Constitutional: She appears well-developed and well-nourished. No distress.  HENT:  Head: Normocephalic.  Eyes: Conjunctivae are normal.  Neck: Neck supple.  Cardiovascular: Normal rate, regular rhythm and normal heart sounds.  Pulmonary/Chest: Effort normal and breath sounds normal. No respiratory distress. She has no wheezes. She has no rales. She exhibits no tenderness.  Abdominal:  Soft. Bowel sounds are normal. She exhibits no distension. There is no tenderness. There is no rebound.  Musculoskeletal: She exhibits no edema.  Neurological: She is alert.  Skin: Skin is warm and dry.  Psychiatric: She has a normal mood and affect. Her behavior is normal.  Nursing note and vitals reviewed.    ED Treatments / Results  Labs (all labs ordered are listed, but only abnormal results are displayed) Labs Reviewed  CBC - Abnormal; Notable for the following components:      Result Value   RBC 3.77 (*)    Hemoglobin 11.2 (*)    HCT 33.4 (*)  All other components within normal limits  COMPREHENSIVE METABOLIC PANEL - Abnormal; Notable for the following components:   Chloride 112 (*)    CO2 21 (*)    Glucose, Bld 104 (*)    All other components within normal limits  I-STAT TROPONIN, ED  I-STAT TROPONIN, ED    EKG  EKG Interpretation None       Radiology Dg Chest 2 View  Result Date: 02/08/2018 CLINICAL DATA:  Generalized chest pain, shortness of breath x1 week EXAM: CHEST  2 VIEW COMPARISON:  11/25/2017 FINDINGS: Lungs are clear.  No pleural effusion or pneumothorax. The heart is normal in size. Cervical spine fixation hardware. Cholecystectomy clips. IMPRESSION: Normal chest radiographs. Electronically Signed   By: Julian Hy M.D.   On: 02/08/2018 13:35    Procedures Procedures (including critical care time)  Medications Ordered in ED Medications - No data to display   Initial Impression / Assessment and Plan / ED Course  I have reviewed the triage vital signs and the nursing notes.  Pertinent labs & imaging results that were available during my care of the patient were reviewed by me and considered in my medical decision making (see chart for details).     Sent in emergency [name] with central chest pain onset 1 week ago.  Currently chest pain-free.  Pain is worsened after eating and when she is sleeping at night.  She does report slight  belching and burping, however denies history of gastritis or acid reflux.  She is also complaining of chronic pain to the left shoulder for which she is requesting pain medications.  Patient's exam was unremarkable.  Her labs which were obtained upfront showed normal troponin, unremarkable EKG, slight anemia with hemoglobin of 11.2, otherwise no significant blood abnormalities.  Discussed with patient that her symptoms could be related to acid reflux.  I recommended obtaining second troponin, however patient is refusing.  She is adamant that she will because she has grandchildren to take care of.  I have talked her into giving [place] a blood sample but not necessarily waiting for the results.  I will follow-up on that.  Patient's heart score is 3.  I think that with 2- troponins, she can follow-up outpatient for further evaluation.  I will start her on Pepcid.  She will follow-up with orthopedics regarding her chronic shoulder pain.  Return precautions discussed.  Vital signs are normal at time of discharge.  Vitals:   02/08/18 1254 02/08/18 1257 02/08/18 1602 02/08/18 1721  BP: 115/77  (!) 160/63 117/82  Pulse: 66  67 71  Resp: 18  18 18   Temp: (!) 97.3 F (36.3 C)   97.9 F (36.6 C)  TempSrc: Oral   Oral  SpO2: 100%  100% 100%  Weight:  94.8 kg (209 lb)    Height:  5\' 5"  (1.651 m)       Final Clinical Impressions(s) / ED Diagnoses   Final diagnoses:  Nonspecific chest pain  Chronic left shoulder pain    ED Discharge Orders        Ordered    famotidine (PEPCID) 20 MG tablet  2 times daily     02/08/18 1918       Jeannett Senior, PA-C 02/09/18 0028    Julianne Rice, MD 02/11/18 1029

## 2018-04-12 ENCOUNTER — Emergency Department (HOSPITAL_COMMUNITY)
Admission: EM | Admit: 2018-04-12 | Discharge: 2018-04-12 | Disposition: A | Discharge status: Home / Self Care | Payer: BLUE CROSS/BLUE SHIELD | Attending: Emergency Medicine | Admitting: Emergency Medicine

## 2018-04-12 DIAGNOSIS — I10 Essential (primary) hypertension: Secondary | ICD-10-CM | POA: Insufficient documentation

## 2018-04-12 DIAGNOSIS — Z87891 Personal history of nicotine dependence: Secondary | ICD-10-CM | POA: Insufficient documentation

## 2018-04-12 DIAGNOSIS — Z79899 Other long term (current) drug therapy: Secondary | ICD-10-CM | POA: Diagnosis not present

## 2018-04-12 DIAGNOSIS — R51 Headache: Secondary | ICD-10-CM | POA: Insufficient documentation

## 2018-04-12 DIAGNOSIS — R519 Headache, unspecified: Secondary | ICD-10-CM

## 2018-04-12 NOTE — Discharge Instructions (Addendum)
Continue taking your medications as prescribed. You may take up to 1000 mg of Tylenol 3 times a day as needed for pain. It is very important that you follow-up with Dr. Gwenlyn Found for further management. Return to the emergency room if you develop a headache that will not resolve, the worst headache of your life, weakness, numbness, or any new or concerning symptoms.

## 2018-04-12 NOTE — ED Notes (Addendum)
Pt adamant about getting something to eat. Pt stated, " I haven't eaten since 8:30 this morning."  Pt stepped away to go to the cafeteria.

## 2018-04-12 NOTE — ED Provider Notes (Signed)
[facility] EMERGENCY DEPARTMENT Provider Note   CSN: [id number] Arrival date & time: 04/12/18  0102     History   Chief Complaint Chief Complaint  Patient presents with  . Headache    HPI Tasha [name] is a 62 y.o. female presenting for evaluation of headache.  Patient states that for the past several months, she has been having intermittent headaches.  She is unable to tell me when this first began, repeatedly stating this is been going on for "a long time."  It is a sharp shooting pain that last for 1-2 seconds before resolving without intervention.  This happens "frequently."  She is not able to tell me if this happens every day or multiple times a day.  She is unsure if this occurs at the same time every day.  She has associated left eye tearing when she has a headache.  No associated nasal congestion.  No associated photophobia, photophobia, nausea, vomiting, chest pain, shortness of breath.  This began when she started taking pravastatin.  Currently does not have a headache.  She denies vision changes.  She denies numbness, weakness, slurred speech, or decreased concentration.  She states she has a history of high blood pressure and left carotid disease. She sees Dr. Gwenlyn Found for management.  Patient states that she just wants to ensure that this is not an aneurysm, as she had a friend die of an aneurysm yesterday.  She denies change in symptoms or worsening of symptoms recently.  Nothing brings the pain on, nothing makes it go away.  Pain extends into her shoulder, she describes a soreness afterwards.  Headache is located on her left side, beginning at the base of her skull shooting towards her forehead.  HPI  Past Medical History:  Diagnosis Date  . DDD (degenerative disc disease)    DJD.  spine.  chronic pain.   . Fibroids   . Hyperlipidemia   . Hypertension   . Hypokalemia 11/26/2017  . Pyelonephritis 2004  . Vertigo 2014   positional.  Eval with Dr Jannifer Franklin,  neuro, 2014.     Patient Active Problem List   Diagnosis Date Noted  . Acute pain of right shoulder 08/20/2016  . Left-sided carotid artery disease ([place]) 06/17/2016  . Essential hypertension 06/17/2016  . Hyperlipidemia 06/17/2016  . Chest pain 06/17/2016  . Palpitations 06/17/2016  . 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 Hypokalemia 07/26/2014  . Constipation 07/26/2014  . BRBPR (bright red blood per rectum) 07/26/2014  . Hematochezia 07/26/2014  . Benign paroxysmal positional vertigo 05/12/2014  . Dizziness and giddiness 09/27/2013  . Uterine leiomyoma 03/03/2013  . Postmenopausal bleeding 03/03/2013  . Pelvic pain 03/03/2013  . 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 Low back pain 10/23/2011    Past Surgical History:  Procedure Laterality Date  . CESAREAN SECTION    . CHOLECYSTECTOMY  2001  . ERCP W/ SPHICTEROTOMY  2001   removal CBD stones  . FOOT SURGERY Right 2017  . NECK SURGERY     cadaver bones put in her neck  . 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      OB History    Gravida  4   Para  1   Term  1   Preterm      AB  3   Living  1     SAB  2  TAB      Ectopic      Multiple      Live Births  1            Home Medications    Prior to Admission medications   Medication Sig Start Date End Date Taking? Authorizing Provider  aspirin EC 81 MG tablet Take 1 tablet (81 mg total) by mouth daily. 06/01/17  Yes Ledell Noss, MD  famotidine (PEPCID) 20 MG tablet Take 1 tablet (20 mg total) by mouth 2 (two) times daily. 02/08/18  Yes Kirichenko, Tatyana, PA-C  naproxen sodium (ALEVE) 220 MG tablet Take 220 mg by mouth 2 (two) times daily as needed (for pain or headaches).    Yes [provider]  Pitavastatin Calcium (LIVALO) 4 MG TABS Take 1 tablet (4 mg total) by mouth daily. 10/07/17  Yes Lorretta Harp, MD  potassium chloride SA (K-DUR,KLOR-CON) 20 MEQ tablet Take 1 tablet (20 mEq total) by mouth daily. 11/27/17  Yes Regalado, Belkys A, MD  sodium bicarbonate 650 MG tablet Take 2 tablets (1,300 mg total) by mouth 2 (two) times daily. Patient not taking: Reported on 04/12/2018 11/27/17   Elmarie Shiley, MD    Family History Family History  Problem Relation Age of Onset  . Hypertension Mother   . Cancer Mother   . Hyperlipidemia Mother   . Pancreatic cancer Mother   . Heart disease Father   . Cancer Father   . Heart attack Father   . Rectal cancer Neg Hx   . Stomach cancer Neg Hx   . Colon cancer Neg Hx     Social History Social History   Tobacco Use  . Smoking status: Former Smoker    Last attempt to quit: 12/22/2008    Years since quitting: 9.3  . Smokeless tobacco: Never Used  Substance Use Topics  . 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 Drug use: No    Frequency: 2.0 times per week    Types: Marijuana    Comment: took this am     Allergies   Antihistamines, chlorpheniramine-type; Hydrocodone; and Sulfa antibiotics   Review of Systems Review of Systems  Constitutional: Negative for chills and fever.  HENT: Negative for congestion.   Eyes: Positive for discharge (Tearing when she has a headache, not currently). Negative for photophobia and visual disturbance.  Respiratory: Negative for chest tightness and shortness of breath.   Cardiovascular: Negative for chest pain.  Gastrointestinal: Negative for nausea and vomiting.  Neurological: Positive for headaches (Not currently).     Physical Exam Updated Vital Signs BP 127/62   Pulse 71   Temp 98 F (36.7 C) (Oral)   Resp (!) 24   LMP 11/03/2012   SpO2 96%   Physical Exam  Constitutional: She is oriented to person, place, and time. She appears well-developed and well-nourished. No distress.  Pt sitting comfortably in well-lit room  in NAD  HENT:  Head: Normocephalic and atraumatic.  Right Ear: Tympanic membrane, external ear and ear canal normal.  Left Ear: Tympanic membrane, external ear and ear canal normal.  Nose: Nose normal.  Mouth/Throat: Uvula is midline, oropharynx is clear and moist and mucous membranes are normal.  Eyes: Pupils are equal, round, and reactive to light. Conjunctivae and EOM are normal.  EOMI and PERRLA.  No nystagmus.  Neck: Normal range of motion. Neck supple.  Cardiovascular: Normal rate,  regular rhythm and intact distal pulses.  Pulmonary/Chest: Effort normal and breath sounds normal. No respiratory distress. She has no wheezes.  Abdominal: Soft. She exhibits no distension and no mass. There is no tenderness. There is no guarding.  Musculoskeletal: Normal range of motion. She exhibits tenderness. She exhibits no edema or deformity.  Tenderness to palpation of left shoulder musculature without obvious deformity.  Grip strength intact bilaterally.  Sensation intact bilaterally.  Radial pulses equal bilaterally.  Neurological: She is alert and oriented to person, place, and time. She has normal strength. No cranial nerve deficit or sensory deficit. Coordination and gait normal. GCS eye subscore is 4. GCS verbal subscore is 5. GCS motor subscore is 6.  Cranial nerves intact.  Grip strength equal.  Nose to finger intact.  No obvious neurologic deficits.  No difficulty with ambulation  Skin: Skin is warm and dry.  Psychiatric: She has a normal mood and affect.  Nursing note and vitals reviewed.    ED Treatments / Results  Labs (all labs ordered are listed, but only abnormal results are displayed) Labs Reviewed - No data to display  EKG EKG Interpretation  Date/Time:  Monday April 12 2018 10:09:51 EDT Ventricular Rate:  61 PR Interval:  206 QRS Duration: 90 QT Interval:  378 QTC Calculation: 380 R Axis:   11 Text Interpretation:  Normal sinus rhythm Normal ECG Confirmed by Pattricia Boss [phone]) on 04/12/2018 10:18:50 PM   Radiology No results found.  Procedures Procedures (including critical care time)  Medications Ordered in ED Medications - No data to display   Initial Impression / Assessment and Plan / ED Course  I have reviewed the triage vital signs and the nursing notes.  Pertinent labs & imaging results that were available during my care of the patient were reviewed by me and considered in my medical decision making (see chart for details).     Patient presenting for evaluation of headache.  Physical exam shows patient who appears in no distress.  Currently without headache.  Symptoms have been intermittent for many months.  No obvious neurologic deficits.  Discussed findings with patient.  Discussed that I doubt this is due to an aneurysm.  As patient has very reproducible muscle soreness, likely muscular cause possibly with occipital nerve irritation.  EKG without signs of stemi. Discussed importance of follow-up with Dr. Gwenlyn Found regarding medication management.  Patient does not want anything for pain at this time, and would like to leave as soon as possible.  At this time, patient appears safe for discharge.  Return precautions given.  Patient states she understands and agrees to plan.   Final Clinical Impressions(s) / ED Diagnoses   Final diagnoses:  Nonintractable headache, unspecified chronicity pattern, unspecified headache type    ED Discharge Orders    None       Franchot Heidelberg, PA-C 04/12/18 2219    Pattricia Boss, MD 04/13/18 1028

## 2018-04-12 NOTE — ED Triage Notes (Signed)
Pt to ER for evaluation of left eye pain, headache, and tearing in left eye onset yesterday. Also reports sharp pain traveling down from left neck and down arm.

## 2018-04-14 ENCOUNTER — Telehealth: Payer: Self-pay | Admitting: Cardiovascular Disease

## 2018-04-14 NOTE — Telephone Encounter (Signed)
Pt calling to set up test, she thinks it's a Carotid, last order was from October and showing due in July. Pt was in ER yesterday. Pls advise if and what test pt needs--[phone]

## 2018-04-14 NOTE — Telephone Encounter (Signed)
Attempt to call x 2-states # is no longer in service

## 2018-04-15 NOTE — Telephone Encounter (Signed)
Attempted to call X 3. Number is no longer in service.

## 2018-04-25 IMAGING — DX DG CHEST 2V
2 series · 2 of 2 positions shown · non-contrast
Comparison: Chest radiograph dated 11/23/2017

CLINICAL DATA: 60-year-old female with cough and chest pain.

EXAM:
CHEST  2 VIEW

[chest pa]
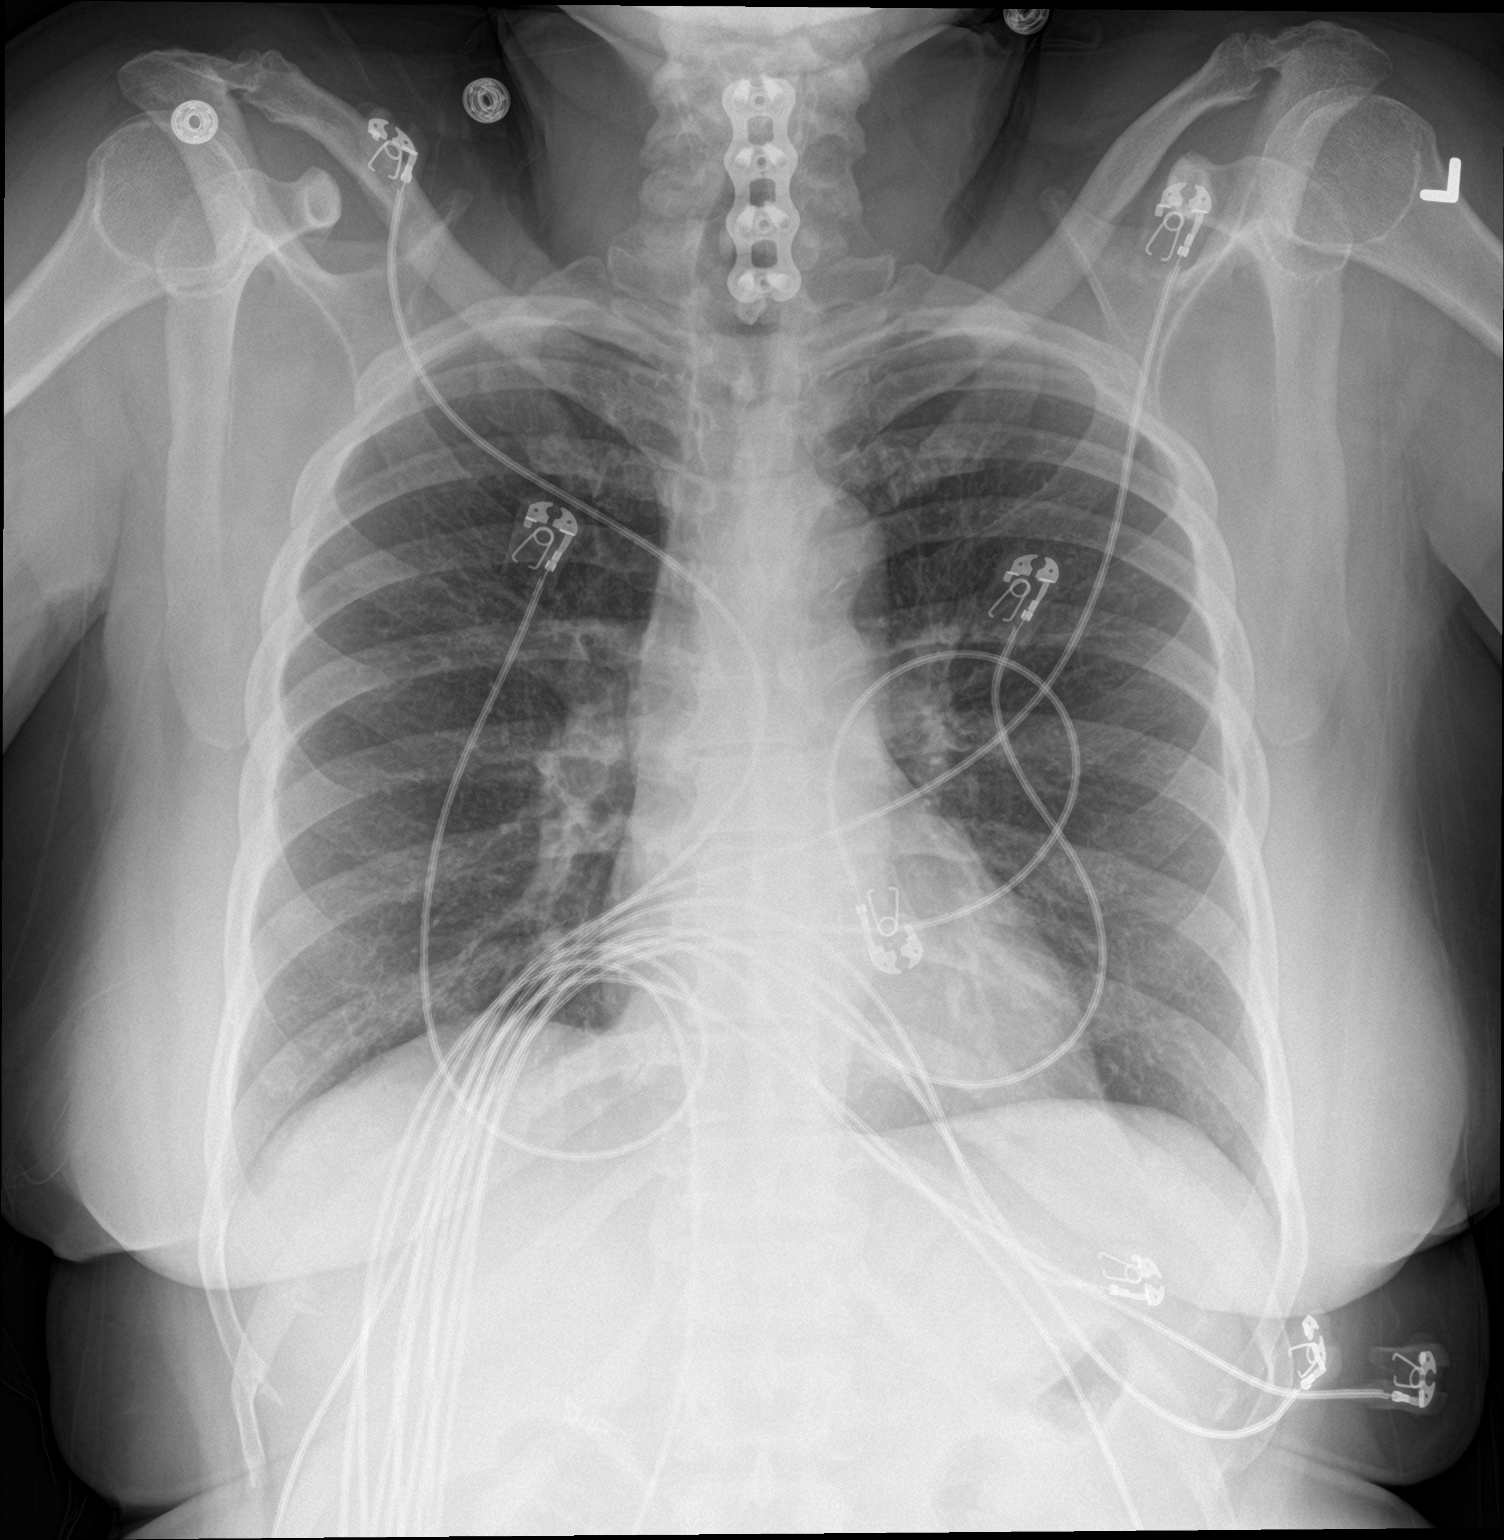

[chest lat]
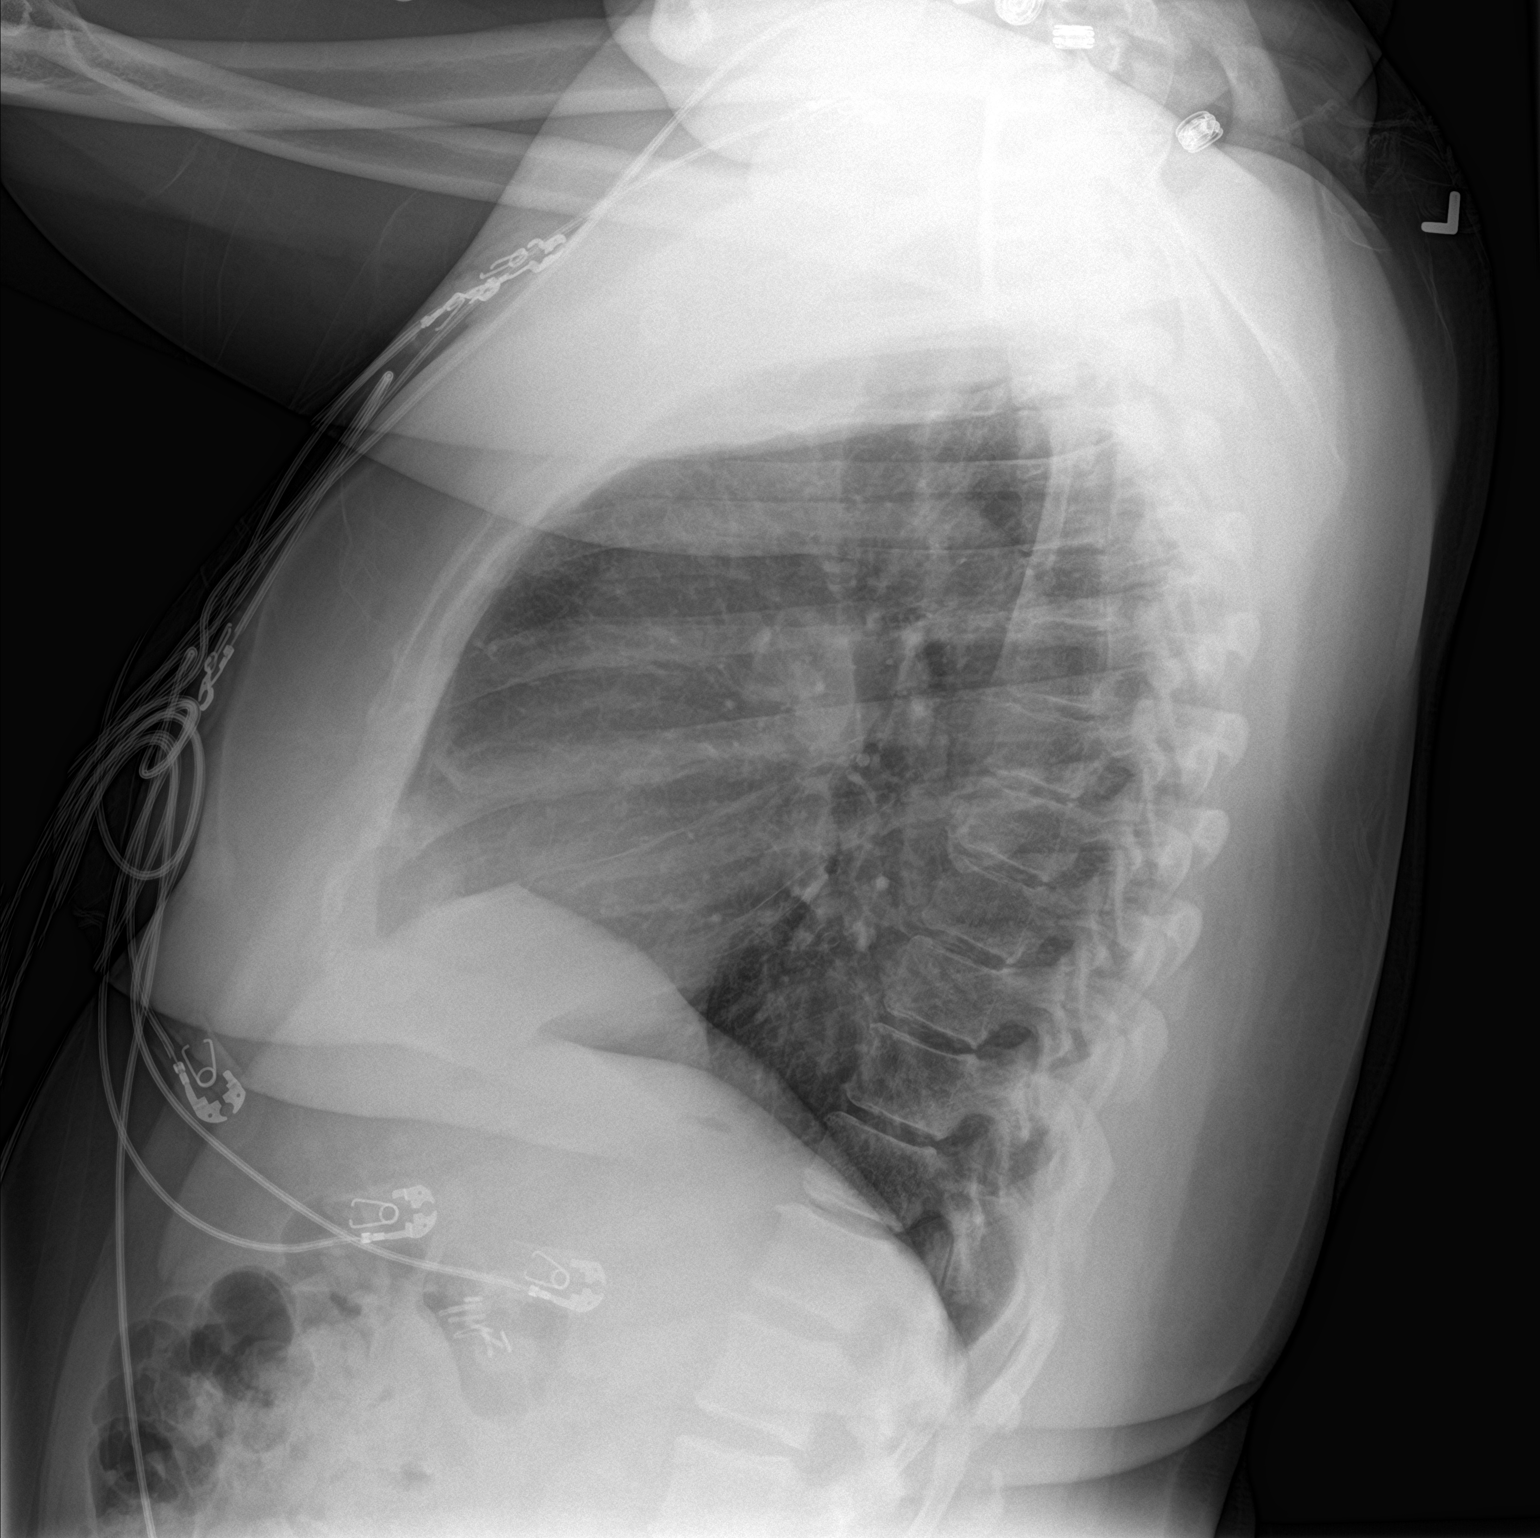

[2 of 2 positions shown; findings below may reference images not displayed]

FINDINGS: The lungs are clear. There is no pleural effusion or pneumothorax.
The cardiac silhouette is within normal limits. Cervical fusion
plate and screws noted. No acute osseous pathology. Cholecystectomy
clips.
IMPRESSION: No active cardiopulmonary disease.

## 2018-06-08 ENCOUNTER — Ambulatory Visit: Payer: BLUE CROSS/BLUE SHIELD | Admitting: Cardiovascular Disease

## 2018-06-08 ENCOUNTER — Encounter: Payer: Self-pay | Admitting: Cardiovascular Disease

## 2018-06-08 VITALS — BP 118/71 | HR 74 | Ht 64.0 in | Wt 218.0 lb

## 2018-06-08 DIAGNOSIS — R0609 Other forms of dyspnea: Secondary | ICD-10-CM | POA: Diagnosis not present

## 2018-06-08 DIAGNOSIS — I6522 Occlusion and stenosis of left carotid artery: Secondary | ICD-10-CM

## 2018-06-08 DIAGNOSIS — I10 Essential (primary) hypertension: Secondary | ICD-10-CM

## 2018-06-08 DIAGNOSIS — R0600 Dyspnea, unspecified: Secondary | ICD-10-CM | POA: Insufficient documentation

## 2018-06-08 NOTE — Assessment & Plan Note (Signed)
History of carotid artery disease with known occlusion of her left internal carotid artery by duplex ultrasound right internal carotid arteries widely patent.  We will recheck carotid Doppler studies.

## 2018-06-08 NOTE — Patient Instructions (Signed)
Medication Instructions: Your physician recommends that you continue on your current medications as directed. Please refer to the Current Medication list given to you today.   Testing/Procedures: Your physician has requested that you have an echocardiogram. Echocardiography is a painless test that uses sound waves to create images of your heart. It provides your doctor with information about the size and shape of your heart and how well your heart's chambers and valves are working. This procedure takes approximately one hour. There are no restrictions for this procedure.  Your physician has requested that you have a carotid duplex in July. This test is an ultrasound of the carotid arteries in your neck. It looks at blood flow through these arteries that supply the brain with blood. Allow one hour for this exam. There are no restrictions or special instructions.  Follow-Up: We request that you follow-up in: 6 months with an extender and in 12 months with Dr Andria Rhein will receive a reminder letter in the mail two months in advance. If you don't receive a letter, please call our office to schedule the follow-up appointment.  If you need a refill on your cardiac medications before your next appointment, please call your pharmacy.

## 2018-06-08 NOTE — Assessment & Plan Note (Signed)
History of hyperlipidemia on statin therapy. 

## 2018-06-08 NOTE — Assessment & Plan Note (Signed)
History of dyspnea on exertion with a long history of tobacco abuse having quit 10 years ago.  She did have a negative Myoview stress test performed 07/03/2016.  We will check a 2D echocardiogram.

## 2018-06-08 NOTE — Progress Notes (Signed)
06/08/2018 Tasha [name]   06/19/1956  [id number]  Primary Physician Nanci Pina, FNP Primary Cardiologist: Lorretta Harp MD Lupe Carney, [place]  HPI:  Tasha [name] is a 62 y.o.  mildly overweight single African-American female mother of one child referred by Josefine Class for cardiovascular evaluation because of known carotid disease and atypical chest pain. I last saw her in the 10/07/2017 .  Unfortunately, her husband passed away in 02-19-23 of this year of cancer. Her only risk factor is hypertension and hyperlipidemia. She has never had a heart attack or stroke. 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 She also occasionally gets chest pain weekly basis for somewhat atypical as well as palpitations. A subsequent Myoview stress test performed 07/03/16 was entirely normal and an event monitor performed 06/17/16 revealed sinus rhythm/sinus tachycardia. She was admitted to the hospital with atypical chest pain on 05/31/17 and ruled out for myocardial infarction. Since I saw her in the office i 10 months ago.  Is remained relatively stable.  She does complain of some dyspnea but denies chest pain..     Current Meds  Medication Sig  . aspirin EC 81 MG tablet Take 1 tablet (81 mg total) by mouth daily.  . furosemide (LASIX) 20 MG tablet Take 1 tablet by mouth daily.  . Pitavastatin Calcium (LIVALO) 4 MG TABS Take 1 tablet (4 mg total) by mouth daily.  . potassium chloride SA (K-DUR,KLOR-CON) 20 MEQ tablet Take 1 tablet (20 mEq total) by mouth daily.     Allergies  Allergen Reactions  . Antihistamines, Chlorpheniramine-Type Itching  . Hydrocodone Hives, Itching and Rash  . Sulfa Antibiotics Itching    Social History   Socioeconomic History  . Marital status: Single    Spouse name: Not on file  . Number of children: 1  . Years of education: college  . Highest education  level: Not on file  Occupational History  . Occupation: [employer]    Employer: [employer]  Social Needs  . Financial resource strain: Not on file  . Food insecurity:    Worry: Not on file    Inability: Not on file  . Transportation needs:    Medical: Not on file    Non-medical: Not on file  Tobacco Use  . Smoking status: Former Smoker    Last attempt to quit: 12/22/2008    Years since quitting: 9.4  . Smokeless tobacco: Never Used  Substance and Sexual Activity  . 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 Drug use: No    Frequency: 2.0 times per week    Types: Marijuana    Comment: took this am  . Sexual activity: Not Currently    Partners: Male    Birth control/protection: None  Lifestyle  . Physical activity:    Days per week: Not on file    Minutes per session: Not on file  . Stress: Not on file  Relationships  . Social connections:    Talks on phone: Not on file    Gets together: Not on file    Attends religious service: Not on file    Active member of club or organization: Not on file    Attends meetings of clubs or organizations: Not on file    Relationship status: Not on file  .  Intimate partner violence:    Fear of current or ex partner: Not on file    Emotionally abused: Not on file    Physically abused: Not on file    Forced sexual activity: Not on file  Other Topics Concern  . Not on file  Social History Narrative  . Not on file     Review of Systems: General: negative for chills, fever, night sweats or weight changes.  Cardiovascular: negative for chest pain, dyspnea on exertion, edema, orthopnea, palpitations, paroxysmal nocturnal dyspnea or shortness of breath Dermatological: negative for rash Respiratory: negative for cough or wheezing Urologic: negative for hematuria Abdominal: negative for nausea, vomiting, diarrhea, bright red blood per rectum, melena, or hematemesis Neurologic:  negative for visual changes, syncope, or dizziness All other systems reviewed and are otherwise negative except as noted above.    Blood pressure 118/71, pulse 74, height 5\' 4"  (1.626 m), weight 218 lb (98.9 kg), last menstrual period 11/03/2012, SpO2 100 %.  General appearance: alert and no distress Neck: no adenopathy, no carotid bruit, no JVD, supple, symmetrical, trachea midline and thyroid not enlarged, symmetric, no tenderness/mass/nodules Lungs: clear to auscultation bilaterally Heart: regular rate and rhythm, S1, S2 normal, no murmur, click, rub or gallop Extremities: edema No edema Pulses: 2+ and symmetric Skin: Skin color, texture, turgor normal. No rashes or lesions  EKG not performed today  ASSESSMENT AND PLAN:   Left-sided carotid artery disease ([facility]) History of carotid artery disease with known occlusion of her left internal carotid artery by duplex ultrasound right internal carotid arteries widely patent.  We will recheck carotid Doppler studies.  Essential hypertension History of essential hypertension her blood pressure measured at 118/71 is not on antihypertensive medications.  Hyperlipidemia History of hyperlipidemia on statin therapy  Dyspnea on exertion History of dyspnea on exertion with a long history of tobacco abuse having quit 10 years ago.  She did have a negative Myoview stress test performed 07/03/2016.  We will check a 2D echocardiogram.      Lorretta Harp MD [facility], [facility] 06/08/2018 8:57 AM

## 2018-06-08 NOTE — Assessment & Plan Note (Signed)
History of essential hypertension her blood pressure measured at 118/71 is not on antihypertensive medications.

## 2018-06-11 ENCOUNTER — Ambulatory Visit (HOSPITAL_COMMUNITY): Payer: BLUE CROSS/BLUE SHIELD | Attending: Cardiovascular Disease

## 2018-06-11 ENCOUNTER — Telehealth (HOSPITAL_COMMUNITY): Payer: Self-pay | Admitting: Cardiovascular Disease

## 2018-06-11 ENCOUNTER — Other Ambulatory Visit: Payer: Self-pay

## 2018-06-11 DIAGNOSIS — Z87891 Personal history of nicotine dependence: Secondary | ICD-10-CM | POA: Insufficient documentation

## 2018-06-11 DIAGNOSIS — I10 Essential (primary) hypertension: Secondary | ICD-10-CM | POA: Insufficient documentation

## 2018-06-11 DIAGNOSIS — R079 Chest pain, unspecified: Secondary | ICD-10-CM | POA: Diagnosis not present

## 2018-06-11 DIAGNOSIS — I679 Cerebrovascular disease, unspecified: Secondary | ICD-10-CM | POA: Insufficient documentation

## 2018-06-11 DIAGNOSIS — R0600 Dyspnea, unspecified: Secondary | ICD-10-CM

## 2018-06-11 DIAGNOSIS — E785 Hyperlipidemia, unspecified: Secondary | ICD-10-CM | POA: Insufficient documentation

## 2018-06-11 DIAGNOSIS — E669 Obesity, unspecified: Secondary | ICD-10-CM | POA: Insufficient documentation

## 2018-06-11 DIAGNOSIS — R0609 Other forms of dyspnea: Secondary | ICD-10-CM

## 2018-06-14 ENCOUNTER — Encounter: Payer: Self-pay | Admitting: Obstetrics

## 2018-06-14 ENCOUNTER — Ambulatory Visit: Payer: BLUE CROSS/BLUE SHIELD | Admitting: Obstetrics

## 2018-06-14 ENCOUNTER — Other Ambulatory Visit (HOSPITAL_COMMUNITY)
Admission: RE | Admit: 2018-06-14 | Discharge: 2018-06-14 | Disposition: A | Discharge status: Home / Self Care | Payer: BLUE CROSS/BLUE SHIELD | Source: Ambulatory Visit | Attending: Obstetrics | Admitting: Obstetrics

## 2018-06-14 VITALS — BP 99/67 | HR 88 | Wt 217.4 lb

## 2018-06-14 DIAGNOSIS — R102 Pelvic and perineal pain: Secondary | ICD-10-CM | POA: Diagnosis not present

## 2018-06-14 DIAGNOSIS — N898 Other specified noninflammatory disorders of vagina: Secondary | ICD-10-CM | POA: Diagnosis not present

## 2018-06-14 DIAGNOSIS — R350 Frequency of micturition: Secondary | ICD-10-CM | POA: Diagnosis not present

## 2018-06-14 DIAGNOSIS — E669 Obesity, unspecified: Secondary | ICD-10-CM

## 2018-06-14 DIAGNOSIS — Z8739 Personal history of other diseases of the musculoskeletal system and connective tissue: Secondary | ICD-10-CM

## 2018-06-14 LAB — POCT URINALYSIS DIPSTICK
BILIRUBIN UA: NEGATIVE
Glucose, UA: NEGATIVE
LEUKOCYTES UA: NEGATIVE
Nitrite, UA: NEGATIVE
Protein, UA: NEGATIVE
RBC UA: NEGATIVE
SPEC GRAV UA: 1.015 (ref 1.010–1.025)
UROBILINOGEN UA: 0.2 U/dL
pH, UA: 5 (ref 5.0–8.0)

## 2018-06-14 NOTE — Progress Notes (Signed)
Presents for pelvic pressure and pain 8-10/10 3++ months.  C/o urinary frequency.  Denies fever, chills, NV  Last PAP 09/10/17 Last Mammogram 02/05/18.

## 2018-06-14 NOTE — Addendum Note (Signed)
Addended by: Marquette Old on: 06/14/2018 05:00 PM   Modules accepted: Orders

## 2018-06-14 NOTE — Telephone Encounter (Signed)
  06/11/2018 09:46 AM Phone ([place]) Kenyon Ana (Self) [phone] (H)   Left Message - Called pt and lmsg for her to CB to see if she would like to come in sooner..RG    By Verdene Rio

## 2018-06-14 NOTE — Addendum Note (Signed)
Addended by: Baltazar Najjar A on: 06/14/2018 05:07 PM   Modules accepted: Orders

## 2018-06-14 NOTE — Progress Notes (Signed)
Patient ID: Tasha [name], female   DOB: [birth year], 62 y.o.   MRN: [id number]  Chief Complaint  Patient presents with  . Pelvic Pain    HPI Tasha [name] is a 62 y.o. female.  Has pelvic pain with pressure and urinary frequency.  Has history of chronic back problems, and is being followed by neurosurgery. HPI  Past Medical History:  Diagnosis Date  . DDD (degenerative disc disease)    DJD.  spine.  chronic pain.   . Fibroids   . Hyperlipidemia   . Hypertension   . Hypokalemia 11/26/2017  . Pyelonephritis 2004  . Vertigo 2014   positional.  Eval with Dr Jannifer Franklin, neuro, 2014.     Past Surgical History:  Procedure Laterality Date  . CESAREAN SECTION    . CHOLECYSTECTOMY  2001  . ERCP W/ SPHICTEROTOMY  2001   removal CBD stones  . FOOT SURGERY Right 2017  . NECK SURGERY     cadaver bones put in her neck  . SKIN BIOPSY  12/2009, 07/9210   9416 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     Family History  Problem Relation Age of Onset  . Hypertension Mother   . Cancer Mother   . Hyperlipidemia Mother   . Pancreatic cancer Mother   . Heart disease Father   . Cancer Father   . Heart attack Father   . Rectal cancer Neg Hx   . Stomach cancer Neg Hx   . Colon cancer Neg Hx     Social History Social History   Tobacco Use  . Smoking status: Former Smoker    Last attempt to quit: 12/22/2008    Years since quitting: 9.4  . Smokeless tobacco: Never Used  Substance Use Topics  . 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 Drug use: No    Frequency: 2.0 times per week    Types: Marijuana    Comment: took this am    Allergies  Allergen Reactions  . Antihistamines, Chlorpheniramine-Type Itching  . Hydrocodone Hives, Itching and Rash  . Sulfa Antibiotics Itching    Current Outpatient Medications  Medication Sig Dispense Refill  . aspirin EC 81 MG tablet Take 1 tablet (81 mg total) by  mouth daily. 30 tablet 0  . EDARBI 80 MG TABS   1  . furosemide (LASIX) 20 MG tablet Take 1 tablet by mouth daily.  1  . ibuprofen (ADVIL,MOTRIN) 800 MG tablet Take 800 mg by mouth 3 (three) times daily.  1  . Pitavastatin Calcium (LIVALO) 4 MG TABS Take 1 tablet (4 mg total) by mouth daily. 30 tablet 6  . potassium chloride SA (K-DUR,KLOR-CON) 20 MEQ tablet Take 1 tablet (20 mEq total) by mouth daily. 5 tablet 0  . traMADol (ULTRAM) 50 MG tablet TAKE 1 TO 2 TABLETS TWICE A DAY  0   No current facility-administered medications for this visit.     Review of Systems Review of Systems Constitutional: negative for fatigue and weight loss Respiratory: negative for cough and wheezing Cardiovascular: negative for chest pain, fatigue and palpitations Gastrointestinal: negative for abdominal pain and change in bowel habits Genitourinary:POSITIVE for pelvic pain and urinary frequency Integument/breast: negative for nipple discharge Musculoskeletal:POSITIVE for myalgias chronic backache Neurological: negative for gait problems and tremors Behavioral/Psych: negative for abusive relationship, depression Endocrine: negative for temperature intolerance      Blood pressure 99/67, pulse 88, weight 217  lb 6.4 oz (98.6 kg), last menstrual period 11/03/2012.  Physical Exam Physical Exam           General:  Alert and no distress Abdomen:  normal findings: no organomegaly, soft, non-tender and no hernia  Pelvis:  External genitalia: normal general appearance Urinary system: urethral meatus normal and bladder without fullness, nontender Vaginal: normal without tenderness, induration or masses Cervix: normal appearance Adnexa: normal bimanual exam Uterus: anteverted and non-tender, normal size    50% of 15 min visit spent on counseling and coordination of care.   Data Reviewed Wet Prep  Assessment       1. Pelvic pain Rx: - US PELVIC COMPLETE WITH TRANSVAGINAL; Future  2. Frequency of  urination Rx: - Urine Culture  3. Hx of backache - followed by Neurosurgery - Dr. Cyndy Freeze  4. Obesity (BMI 35.0-39.9 without comorbidity) - program od caloric reduction, exercise and lifestyle modification recommended   Plan    Follow up in 2 weeks   Orders Placed This Encounter  Procedures  . Urine Culture  . US PELVIC COMPLETE WITH TRANSVAGINAL    Epic Wt 217/ no needs/ ins bcbs    Standing Status:   Future    Standing Expiration Date:   08/15/2019    Order Specific Question:   Reason for Exam (SYMPTOM  OR DIAGNOSIS REQUIRED)    Answer:   Pelvic pain and pressure    Order Specific Question:   Preferred imaging location?    Answer:   [facility]  . POCT Urinalysis Dipstick       Shelly Bombard MD 04-14-2018

## 2018-06-16 LAB — URINE CULTURE: ORGANISM ID, BACTERIA: NO GROWTH

## 2018-06-16 LAB — CERVICOVAGINAL ANCILLARY ONLY
Bacterial vaginitis: NEGATIVE
Candida vaginitis: NEGATIVE
Trichomonas: NEGATIVE

## 2018-06-28 ENCOUNTER — Ambulatory Visit: Payer: BLUE CROSS/BLUE SHIELD | Admitting: Obstetrics

## 2018-06-29 ENCOUNTER — Ambulatory Visit
Admission: RE | Admit: 2018-06-29 | Discharge: 2018-06-29 | Disposition: A | Discharge status: Home / Self Care | Payer: BLUE CROSS/BLUE SHIELD | Source: Ambulatory Visit | Attending: Obstetrics | Admitting: Obstetrics

## 2018-06-29 DIAGNOSIS — R102 Pelvic and perineal pain: Secondary | ICD-10-CM

## 2018-06-30 ENCOUNTER — Ambulatory Visit: Payer: BLUE CROSS/BLUE SHIELD | Admitting: Obstetrics

## 2018-07-09 ENCOUNTER — Ambulatory Visit (HOSPITAL_COMMUNITY)
Admission: RE | Admit: 2018-07-09 | Discharge: 2018-07-09 | Disposition: A | Discharge status: Home / Self Care | Payer: BLUE CROSS/BLUE SHIELD | Source: Ambulatory Visit | Attending: Cardiology | Admitting: Cardiology

## 2018-07-09 DIAGNOSIS — I6522 Occlusion and stenosis of left carotid artery: Secondary | ICD-10-CM | POA: Diagnosis not present

## 2018-07-14 ENCOUNTER — Other Ambulatory Visit: Payer: Self-pay | Admitting: *Deleted

## 2018-07-14 DIAGNOSIS — I6522 Occlusion and stenosis of left carotid artery: Secondary | ICD-10-CM

## 2018-08-14 ENCOUNTER — Other Ambulatory Visit: Payer: Self-pay | Admitting: Cardiovascular Disease

## 2018-08-14 DIAGNOSIS — E7800 Pure hypercholesterolemia, unspecified: Secondary | ICD-10-CM

## 2018-09-23 NOTE — Progress Notes (Signed)
error 

## 2018-09-30 ENCOUNTER — Ambulatory Visit (HOSPITAL_COMMUNITY): Payer: BLUE CROSS/BLUE SHIELD

## 2018-09-30 ENCOUNTER — Ambulatory Visit: Payer: BLUE CROSS/BLUE SHIELD

## 2018-09-30 ENCOUNTER — Telehealth: Payer: Self-pay | Admitting: *Deleted

## 2018-09-30 NOTE — Telephone Encounter (Signed)
Called patient regarding lab, CT and fu, lvm for a return call

## 2018-10-01 ENCOUNTER — Ambulatory Visit
Admission: RE | Admit: 2018-10-01 | Discharge: 2018-10-01 | Disposition: A | Discharge status: Home / Self Care | Payer: BLUE CROSS/BLUE SHIELD | Source: Ambulatory Visit | Attending: Radiation Oncology | Admitting: Radiation Oncology

## 2018-10-05 ENCOUNTER — Ambulatory Visit: Payer: BLUE CROSS/BLUE SHIELD | Admitting: Radiation Oncology

## 2018-10-12 ENCOUNTER — Ambulatory Visit (HOSPITAL_COMMUNITY): Payer: BLUE CROSS/BLUE SHIELD

## 2018-10-12 ENCOUNTER — Ambulatory Visit: Payer: BLUE CROSS/BLUE SHIELD

## 2018-10-13 ENCOUNTER — Ambulatory Visit: Payer: BLUE CROSS/BLUE SHIELD | Admitting: Radiation Oncology

## 2018-10-19 ENCOUNTER — Telehealth: Payer: Self-pay | Admitting: *Deleted

## 2018-10-19 NOTE — Telephone Encounter (Signed)
CALLED PATIENT TO ASK QUESTION, LVM FOR A RETURN CALL 

## 2018-10-19 NOTE — Progress Notes (Signed)
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   Pain issues, if any: She reports chronic back pain.  Using a feeding tube?: No Weight changes, if any:  Wt Readings from Last 3 Encounters:  10/22/18 215 lb (97.5 kg)  06/14/18 217 lb 6.4 oz (98.6 kg)  06/08/18 218 lb (98.9 kg)   Swallowing issues, if any: She denies.  Smoking or chewing tobacco? She denies.  Using fluoride trays daily? No Last ENT visit was on: Dr. Vicie Mutters last on 04/09/15. Other notable issues, if any:  CT neck 10/21/18   BP (!) 141/70 (BP Location: Right Arm, Patient Position: Sitting)   Pulse 70   Temp 97.9 F (36.6 C) (Oral)   Resp 18   Ht 5\' 5"  (1.651 m)   Wt 215 lb (97.5 kg)   LMP 11/03/2012   SpO2 100%   BMI 35.78 kg/m

## 2018-10-20 ENCOUNTER — Ambulatory Visit: Payer: BLUE CROSS/BLUE SHIELD

## 2018-10-21 ENCOUNTER — Ambulatory Visit (HOSPITAL_COMMUNITY)
Admission: RE | Admit: 2018-10-21 | Discharge: 2018-10-21 | Disposition: A | Discharge status: Home / Self Care | Payer: BLUE CROSS/BLUE SHIELD | Source: Ambulatory Visit | Attending: Radiation Oncology | Admitting: Radiation Oncology

## 2018-10-21 ENCOUNTER — Ambulatory Visit
Admission: RE | Admit: 2018-10-21 | Discharge: 2018-10-21 | Disposition: A | Discharge status: Home / Self Care | Payer: BLUE CROSS/BLUE SHIELD | Source: Ambulatory Visit | Attending: Radiation Oncology | Admitting: Radiation Oncology

## 2018-10-21 DIAGNOSIS — D210 Benign neoplasm of connective and other soft tissue of head, face and neck: Secondary | ICD-10-CM | POA: Diagnosis not present

## 2018-10-21 DIAGNOSIS — I6522 Occlusion and stenosis of left carotid artery: Secondary | ICD-10-CM | POA: Insufficient documentation

## 2018-10-21 DIAGNOSIS — J439 Emphysema, unspecified: Secondary | ICD-10-CM | POA: Diagnosis not present

## 2018-10-21 DIAGNOSIS — D355 Benign neoplasm of carotid body: Secondary | ICD-10-CM | POA: Diagnosis not present

## 2018-10-21 LAB — COMPREHENSIVE METABOLIC PANEL
ALK PHOS: 84 U/L (ref 38–126)
ALT: 19 U/L (ref 0–44)
ANION GAP: 10 (ref 5–15)
AST: 19 U/L (ref 15–41)
Albumin: 3.8 g/dL (ref 3.5–5.0)
BUN: 23 mg/dL (ref 8–23)
CALCIUM: 9.5 mg/dL (ref 8.9–10.3)
CHLORIDE: 113 mmol/L — AB (ref 98–111)
CO2: 21 mmol/L — AB (ref 22–32)
Creatinine, Ser: 0.9 mg/dL (ref 0.44–1.00)
GFR calc non Af Amer: >60 mL/min (ref >60)
Glucose, Bld: 86 mg/dL (ref 70–99)
POTASSIUM: 3.6 mmol/L (ref 3.5–5.1)
SODIUM: 144 mmol/L (ref 135–145)
Total Bilirubin: 0.5 mg/dL (ref 0.3–1.2)
Total Protein: 7.3 g/dL (ref 6.5–8.1)

## 2018-10-21 MED ORDER — IOHEXOL 300 MG/ML  SOLN
75.0000 mL | Freq: Once | INTRAMUSCULAR | Status: AC | PRN
  Administered 2018-10-21: 75 mL via INTRAVENOUS

## 2018-10-21 MED ORDER — SODIUM CHLORIDE 0.9 % IJ SOLN
INTRAMUSCULAR | Status: AC
  Filled 2018-10-21: qty 50

## 2018-10-22 ENCOUNTER — Encounter: Payer: Self-pay | Admitting: Radiation Oncology

## 2018-10-22 ENCOUNTER — Other Ambulatory Visit: Payer: Self-pay

## 2018-10-22 ENCOUNTER — Ambulatory Visit
Admission: RE | Admit: 2018-10-22 | Discharge: 2018-10-22 | Disposition: A | Discharge status: Home / Self Care | Payer: BLUE CROSS/BLUE SHIELD | Source: Ambulatory Visit | Attending: Radiation Oncology | Admitting: Radiation Oncology

## 2018-10-22 DIAGNOSIS — D356 Benign neoplasm of aortic body and other paraganglia: Secondary | ICD-10-CM

## 2018-10-22 DIAGNOSIS — Z923 Personal history of irradiation: Secondary | ICD-10-CM | POA: Diagnosis not present

## 2018-10-22 DIAGNOSIS — I6522 Occlusion and stenosis of left carotid artery: Secondary | ICD-10-CM | POA: Insufficient documentation

## 2018-10-22 DIAGNOSIS — D446 Neoplasm of uncertain behavior of carotid body: Secondary | ICD-10-CM | POA: Insufficient documentation

## 2018-10-22 NOTE — Progress Notes (Addendum)
Radiation Oncology         [phone] ________________________________  Name: Tasha [name] MRN: [id number]  Date: 10/19/2015  DOB: 19-Mar-1956  Follow-Up Visit Note  Outpatient  CC: No PCP Per Patient  Philomena Doheny, MD  Diagnosis and Prior Radiotherapy:    ICD-10-CM   1. Carotid body tumor ([place]) D44.6    Right carotid body paraganglioma   Radiation treatment dates:   05/14/2015-06/19/2015 Site/dose:   Right Carotid Body Paraganglioma / 45Gy in 25 fractions  Narrative:  The patient returns today for routine follow-up.  She denies problems swallowing.  She is grieving the loss of her husband this year.  Still working at [employer]. No new neck masses. CT neck is  stable.  Physical Findings:  Patient is alert and oriented.  Vitals:   10/22/18 1607  BP: (!) 141/70  Pulse: 70  Resp: 18  Temp: 97.9 F (36.6 C)  SpO2: 100%   Wt Readings from Last 3 Encounters:  10/22/18 215 lb (97.5 kg)  06/14/18 217 lb 6.4 oz (98.6 kg)  06/08/18 218 lb (98.9 kg)     Neck:Some subtle fullness in the levels 2 to 3 regions of the right neck.  HEENT:Oropharynx is clear. Skin: Skin intact over neck. Heart: RRR, no murmurs Chest: CTAB   Lab Findings: Lab Results  Component Value Date   WBC 6.2 04/30/2015   HGB 12.2 04/30/2015   HCT 36.5 04/30/2015   MCV 89.7 04/30/2015   PLT 190 04/30/2015    Radiographic Findings: Ct Soft Tissue Neck W Contrast  Result Date: 10/22/2018 CLINICAL DATA:  62 year old female with right carotid body paraganglioma treated with radiotherapy in 2017. Restaging. EXAM: CT NECK WITH CONTRAST TECHNIQUE: Multidetector CT imaging of the neck was performed using the standard protocol following the bolus administration of intravenous contrast. CONTRAST:  61mL OMNIPAQUE IOHEXOL 300 MG/ML  SOLN COMPARISON:  09/21/2017 and earlier. FINDINGS: Pharynx and larynx: Laryngeal and pharyngeal soft tissue contours remain within normal limits. The superior parapharyngeal  scratched at the superior right and the left parapharyngeal spaces are normal. Chronic lobulated and intensely enhancing mass involving the medial left carotid space and right parapharyngeal space redemonstrated and today measures about 21 x 33 x 48 millimeters (AP by transverse by CC) which is not significantly different from the 2018 study allowing for differences in measurement technique. Stable enhancement pattern. Stable surrounding right carotid space vasculature, the right ECA and ICA are splayed by the mass as before. The retropharyngeal space is only marginally involved as before. Salivary glands: Negative sublingual space and submandibular glands. Negative parotid glands. Thyroid: Negative. Lymph nodes: No cervical lymphadenopathy. Lymph nodes are stable and within normal limits. Vascular: Right carotid space mass described above. Chronic occlusion of the left ICA. The remaining the major vascular structures in the neck and at the skull base are patent and appear stable. The left vertebral artery is dominant. Limited intracranial: Negative. Visualized orbits: Not included today. Mastoids and visualized paranasal sinuses: Clear. Skeleton: Chronic cervical ACDF from C4 to C7 with solid appearing arthrodesis. No acute osseous abnormality identified. The some carious right maxillary dentition is noted. Upper chest: Chronic upper lobe emphysema, mostly paraseptal. Stable visible lungs. Mild calcified aortic atherosclerosis. But otherwise normal visible superior mediastinum. IMPRESSION: 1. No significant change in the right carotid space Paraganglioma since 2018, roughly 21 x 33 x 48 mm (AP by transverse by CC). 2. No new abnormality in the neck. 3. Chronic occlusion of the left ICA. Chronic upper lobe  Emphysema (ICD10-J43.9). Electronically Signed   By: Genevie Ann M.D.   On: 10/22/2018 07:54    Impression/Plan:   Stable mass radiographically in neck following RT for paraganglioma.  Satisfactory treatment  response.   Dr. Gwenlyn Found is following the chronic L ICA occlusion  F/u in 11yr with repeat CT of neck w/ contrast.  I gave her emotional support today and let her know to call us if she needs encouragement and /or a social work referral for grief counseling.   In a face to face visit lasting 20 minutes, greater than 50% of the time was spent  discussing logistics of treatment, and coordinating the patient's care.  _____________________________________   Eppie Gibson, MD

## 2018-11-17 ENCOUNTER — Ambulatory Visit: Payer: BLUE CROSS/BLUE SHIELD | Admitting: Cardiovascular Disease

## 2018-11-17 ENCOUNTER — Encounter: Payer: Self-pay | Admitting: Cardiovascular Disease

## 2018-11-17 VITALS — BP 102/58 | HR 68 | Ht 65.0 in | Wt 213.0 lb

## 2018-11-17 DIAGNOSIS — E7800 Pure hypercholesterolemia, unspecified: Secondary | ICD-10-CM

## 2018-11-17 DIAGNOSIS — I6522 Occlusion and stenosis of left carotid artery: Secondary | ICD-10-CM | POA: Diagnosis not present

## 2018-11-17 NOTE — Assessment & Plan Note (Addendum)
History of hyperlipidemia on Livalo.  We will recheck a lipid and liver profile.  Her last lipid profile performed 06/01/2017 revealed total cholesterol 162, LDL of 96 and HDL 38.

## 2018-11-17 NOTE — Assessment & Plan Note (Signed)
History of carotid artery disease with a known occluded right carotid artery and mild to moderate left ICA stenosis by duplex ultrasound performed 07/12/2018.  This will be repeated on an annual basis.

## 2018-11-17 NOTE — Progress Notes (Signed)
11/17/2018 Tasha [name]   1956/05/19  [id number]  Primary Physician Suella Broad, FNP Primary Cardiologist: Lorretta Harp MD Lupe Carney, [place]  HPI:  Tasha [name] is a 62 y.o.  mildly overweight single African-American female mother of one child referred by Josefine Class for cardiovascular evaluation because of known carotid disease and atypical chest pain. I last saw her in the  06/08/2018.  Unfortunately, her husband passed away in March 17, 2023 of this year of cancer. Her only risk factor is hypertension and hyperlipidemia. She has never had a heart attack or stroke. 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 She also occasionally gets chest pain weekly basis for somewhat atypical as well as palpitations. A subsequent Myoview stress test performed 07/03/16 was entirely normal and an event monitor performed 06/17/16 revealed sinus rhythm/sinus tachycardia. She was admitted to the hospital with atypical chest pain on 05/31/17 and ruled out for myocardial infarction.   Since I saw her 6 months ago she is remained stable.  She denies chest pain or shortness of breath.  A carotid Doppler study performed 07/12/2018 revealed occluded and occluded left carotid system with moderate right ICA stenosis.  A 2D echo performed 06/11/2018 revealed normal LV systolic function with grade 1 diastolic dysfunction.     Current Meds  Medication Sig  . aspirin EC 81 MG tablet Take 1 tablet (81 mg total) by mouth daily.  Marland Kitchen EDARBI 80 MG TABS   . furosemide (LASIX) 20 MG tablet Take 1 tablet by mouth daily.  Marland Kitchen ibuprofen (ADVIL,MOTRIN) 800 MG tablet Take 800 mg by mouth 3 (three) times daily.  Marland Kitchen LIVALO 4 MG TABS TAKE 1 TABLET BY MOUTH EVERY DAY  . potassium chloride SA (K-DUR,KLOR-CON) 20 MEQ tablet Take 1 tablet (20 mEq total) by mouth daily.  . traMADol (ULTRAM) 50 MG tablet TAKE 1 TO 2 TABLETS TWICE A  DAY     Allergies  Allergen Reactions  . Antihistamines, Chlorpheniramine-Type Itching  . Hydrocodone Hives, Itching and Rash  . Sulfa Antibiotics Itching    Social History   Socioeconomic History  . Marital status: Single    Spouse name: Not on file  . Number of children: 1  . Years of education: college  . Highest education level: Not on file  Occupational History  . Occupation: [employer]    Employer: [employer]  Social Needs  . Financial resource strain: Not on file  . Food insecurity:    Worry: Not on file    Inability: Not on file  . Transportation needs:    Medical: No    Non-medical: No  Tobacco Use  . Smoking status: Former Smoker    Last attempt to quit: 12/22/2008    Years since quitting: 9.9  . Smokeless tobacco: Never Used  Substance and Sexual Activity  . Alcohol use: No    Alcohol/week: 0.0 standard drinks  . Drug use: Yes    Frequency: 7.0 times per week    Types: Marijuana    Comment: took this am, history of crack cocaine abuse  . Sexual activity: Not Currently    Partners: Male    Birth control/protection: None  Lifestyle  . Physical activity:    Days per week: Not on file    Minutes per session: Not on file  . Stress: Not on file  Relationships  . Social connections:    Talks  on phone: Not on file    Gets together: Not on file    Attends religious service: Not on file    Active member of club or organization: Not on file    Attends meetings of clubs or organizations: Not on file    Relationship status: Not on file  . Intimate partner violence:    Fear of current or ex partner: No    Emotionally abused: No    Physically abused: No    Forced sexual activity: No  Other Topics Concern  . Not on file  Social History Narrative  . Not on file     Review of Systems: General: negative for chills, fever, night sweats or weight changes.  Cardiovascular: negative for chest pain, dyspnea on exertion, edema, orthopnea, palpitations,  paroxysmal nocturnal dyspnea or shortness of breath Dermatological: negative for rash Respiratory: negative for cough or wheezing Urologic: negative for hematuria Abdominal: negative for nausea, vomiting, diarrhea, bright red blood per rectum, melena, or hematemesis Neurologic: negative for visual changes, syncope, or dizziness All other systems reviewed and are otherwise negative except as noted above.    Blood pressure (!) 102/58, pulse 68, height 5\' 5"  (1.651 m), weight 213 lb (96.6 kg), last menstrual period 11/03/2012.  General appearance: alert and no distress Neck: no adenopathy, no JVD, supple, symmetrical, trachea midline, thyroid not enlarged, symmetric, no tenderness/mass/nodules and Right carotid bruit Lungs: clear to auscultation bilaterally Heart: regular rate and rhythm, S1, S2 normal, no murmur, click, rub or gallop Extremities: extremities normal, atraumatic, no cyanosis or edema Pulses: 2+ and symmetric Skin: Skin color, texture, turgor normal. No rashes or lesions Neurologic: Alert and oriented X 3, normal strength and tone. Normal symmetric reflexes. Normal coordination and gait  EKG not performed today  ASSESSMENT AND PLAN:   Left-sided carotid artery disease (HCC) History of carotid artery disease with a known occluded right carotid artery and mild to moderate left ICA stenosis by duplex ultrasound performed 07/12/2018.  This will be repeated on an annual basis.  Essential hypertension She of essential hypertension her blood pressure measured today 102/58.  [place] is [place].  Continue current meds at current dosing.  Hyperlipidemia History of hyperlipidemia on Livalo.  We will recheck a lipid and liver profile.  Her last lipid profile performed 06/01/2017 revealed total cholesterol 162, LDL of 96 and HDL 38.      Lorretta Harp MD [facility], [place] [facility] 11/17/2018 4:11 PM

## 2018-11-17 NOTE — Patient Instructions (Signed)
Medication Instructions:  none If you need a refill on your cardiac medications before your next appointment, please call your pharmacy.   Lab work: Your physician recommends that you return for lab work.  If you have labs (blood work) drawn today and your tests are completely normal, you will receive your results only by: Marland Kitchen MyChart Message (if you have MyChart) OR . A paper copy in the mail If you have any lab test that is abnormal or we need to change your treatment, we will call you to review the results.  Testing/Procedures: Your physician has requested that you have a carotid duplex. This test is an ultrasound of the carotid arteries in your neck. It looks at blood flow through these arteries that supply the brain with blood. Allow one hour for this exam. There are no restrictions or special instructions.   Follow-Up: At [facility], you and your health needs are our priority.  As part of our continuing mission to provide you with exceptional heart care, we have created designated Provider Care Teams.  These Care Teams include your primary Cardiologist (physician) and Advanced Practice Providers (APPs -  Physician Assistants and Nurse Practitioners) who all work together to provide you with the care you need, when you need it. You will need a follow up appointment in 12 months.  Please call our office 2 months in advance to schedule this appointment.  You may see Dr. Gwenlyn Found or one of the following Advanced Practice Providers on your designated Care Team:   Kerin Ransom, PA-C Roby Lofts, [place] . Sande Rives, PA-C

## 2018-11-17 NOTE — Assessment & Plan Note (Signed)
She of essential hypertension her blood pressure measured today 102/58.  She is on [place].  Continue current meds at current dosing.

## 2018-12-09 ENCOUNTER — Other Ambulatory Visit: Payer: Self-pay | Admitting: Family

## 2018-12-09 DIAGNOSIS — R1011 Right upper quadrant pain: Secondary | ICD-10-CM

## 2018-12-14 ENCOUNTER — Ambulatory Visit
Admission: RE | Admit: 2018-12-14 | Discharge: 2018-12-14 | Disposition: A | Discharge status: Home / Self Care | Payer: BLUE CROSS/BLUE SHIELD | Source: Ambulatory Visit | Attending: Family | Admitting: Family

## 2018-12-14 DIAGNOSIS — R1011 Right upper quadrant pain: Secondary | ICD-10-CM

## 2018-12-17 ENCOUNTER — Emergency Department (HOSPITAL_COMMUNITY)
Admission: EM | Admit: 2018-12-17 | Discharge: 2018-12-17 | Discharge status: Against Medical Advice | Payer: BLUE CROSS/BLUE SHIELD | Attending: Emergency Medicine | Admitting: Emergency Medicine

## 2018-12-17 ENCOUNTER — Other Ambulatory Visit: Payer: Self-pay

## 2018-12-17 ENCOUNTER — Encounter (HOSPITAL_COMMUNITY): Payer: Self-pay

## 2018-12-17 ENCOUNTER — Emergency Department (HOSPITAL_COMMUNITY): Payer: BLUE CROSS/BLUE SHIELD

## 2018-12-17 DIAGNOSIS — F1490 Cocaine use, unspecified, uncomplicated: Secondary | ICD-10-CM | POA: Insufficient documentation

## 2018-12-17 DIAGNOSIS — F1290 Cannabis use, unspecified, uncomplicated: Secondary | ICD-10-CM | POA: Insufficient documentation

## 2018-12-17 DIAGNOSIS — Y999 Unspecified external cause status: Secondary | ICD-10-CM | POA: Insufficient documentation

## 2018-12-17 DIAGNOSIS — R42 Dizziness and giddiness: Secondary | ICD-10-CM | POA: Insufficient documentation

## 2018-12-17 DIAGNOSIS — W1830XA Fall on same level, unspecified, initial encounter: Secondary | ICD-10-CM | POA: Diagnosis not present

## 2018-12-17 DIAGNOSIS — Z87891 Personal history of nicotine dependence: Secondary | ICD-10-CM | POA: Diagnosis not present

## 2018-12-17 DIAGNOSIS — Y929 Unspecified place or not applicable: Secondary | ICD-10-CM | POA: Diagnosis not present

## 2018-12-17 DIAGNOSIS — Y939 Activity, unspecified: Secondary | ICD-10-CM | POA: Diagnosis not present

## 2018-12-17 DIAGNOSIS — M542 Cervicalgia: Secondary | ICD-10-CM | POA: Diagnosis not present

## 2018-12-17 DIAGNOSIS — R51 Headache: Secondary | ICD-10-CM | POA: Diagnosis not present

## 2018-12-17 DIAGNOSIS — S0990XA Unspecified injury of head, initial encounter: Secondary | ICD-10-CM | POA: Diagnosis not present

## 2018-12-17 DIAGNOSIS — Z5320 Procedure and treatment not carried out because of patient's decision for unspecified reasons: Secondary | ICD-10-CM | POA: Diagnosis not present

## 2018-12-17 DIAGNOSIS — Z79899 Other long term (current) drug therapy: Secondary | ICD-10-CM | POA: Diagnosis not present

## 2018-12-17 NOTE — ED Notes (Signed)
Pt wants to leave AMA.

## 2018-12-17 NOTE — ED Notes (Signed)
Attempted to call pt to tell her CT results were clear of any acute changes as requested by EDP Tivis Ringer). Person who answered the phone said I had the wrong number. EDP notified.

## 2018-12-17 NOTE — ED Notes (Signed)
Pt upset with wait time. Stating that she has to go pick up her daughter and cannot stay any longer. Pt requesting to leave AMA. No signature pad available to collect signature. Pt educated on importance of staying, still requesting to leave.

## 2018-12-17 NOTE — ED Notes (Signed)
Pt refused vital signs before leaving.

## 2018-12-17 NOTE — ED Notes (Signed)
Pt to CT

## 2018-12-17 NOTE — ED Notes (Signed)
CT coming to get pt now.

## 2018-12-17 NOTE — ED Provider Notes (Signed)
[facility] EMERGENCY DEPARTMENT Provider Note   CSN: [id number] Arrival date & time: 12/17/18  1141     History   Chief Complaint Chief Complaint  Patient presents with  . Fall    HPI Tasha [name] is a 62 y.o. female with history of degenerative disc disease, hypertension, hyperlipidemia who presents with headache and lightheadedness for the past 6 days after falling.  Patient reports she was bending over to tie her shoe when she fell.  She had her head.  She did not lose consciousness.  She reports she has had persistent tenderness where she hit her head and also generalized headache and photophobia.  She is also had lightheadedness intermittently.  She denies any vision changes, numbness or tingling.  She does have some associated neck pain.  Patient denies any chest pain, shortness of breath, new abdominal pain, nausea, vomiting.  HPI  Past Medical History:  Diagnosis Date  . DDD (degenerative disc disease)    DJD.  spine.  chronic pain.   . Fibroids   . Hyperlipidemia   . Hypertension   . Hypokalemia 11/26/2017  . Pyelonephritis 2004  . Vertigo 2014   positional.  Eval with Dr Jannifer Franklin, neuro, 2014.     Patient Active Problem List   Diagnosis Date Noted  . Dyspnea on exertion 06/08/2018  . Acute pain of right shoulder 08/20/2016  . Left-sided carotid artery disease ([place]) 06/17/2016  . Essential hypertension 06/17/2016  . Hyperlipidemia 06/17/2016  . Chest pain 06/17/2016  . Palpitations 06/17/2016  . Carotid body tumor ([place]) 04/18/2015  . Hypokalemia 07/26/2014  . Constipation 07/26/2014  . BRBPR (bright red blood per rectum) 07/26/2014  . Hematochezia 07/26/2014  . Benign paroxysmal positional vertigo 05/12/2014  . Dizziness and giddiness 09/27/2013  . Uterine leiomyoma 03/03/2013  . Postmenopausal bleeding 03/03/2013  . Pelvic pain 03/03/2013  . 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 Low back pain 10/23/2011    Past Surgical History:  Procedure Laterality Date  . CESAREAN SECTION    . CHOLECYSTECTOMY  2001  . ERCP W/ SPHICTEROTOMY  2001   removal CBD stones  . FOOT SURGERY Right 2017  . NECK SURGERY     cadaver bones put in her neck  . 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      OB History    Gravida  4   Para  1   Term  1   Preterm      AB  3   Living  1     SAB  2   TAB      Ectopic      Multiple      Live Births  1            Home Medications    Prior to Admission medications   Medication Sig Start Date End Date Taking? Authorizing Provider  aspirin EC 81 MG tablet Take 1 tablet (81 mg total) by mouth daily. 06/01/17   Ledell Noss, MD  EDARBI 80 MG TABS  06/11/18   [provider]  furosemide (LASIX) 20 MG tablet Take 1 tablet by mouth daily. 03/03/18   [provider]  ibuprofen (ADVIL,MOTRIN) 800 MG tablet Take 800 mg by mouth 3 (three) times daily. 06/06/18   [provider]  LIVALO 4 MG TABS TAKE 1 TABLET BY MOUTH EVERY DAY 08/16/18  Lorretta Harp, MD  potassium chloride SA (K-DUR,KLOR-CON) 20 MEQ tablet Take 1 tablet (20 mEq total) by mouth daily. 11/27/17   Regalado, Belkys A, MD  traMADol (ULTRAM) 50 MG tablet TAKE 1 TO 2 TABLETS TWICE A DAY 06/06/18   [provider]    Family History Family History  Problem Relation Age of Onset  . Hypertension Mother   . Cancer Mother   . Hyperlipidemia Mother   . Pancreatic cancer Mother   . Heart disease Father   . Cancer Father   . Heart attack Father   . Rectal cancer Neg Hx   . Stomach cancer Neg Hx   . Colon cancer Neg Hx     Social History Social History   Tobacco Use  . Smoking status: Former Smoker    Last attempt to quit: 12/22/2008    Years since quitting: 9.9  . Smokeless tobacco: Never Used  Substance Use Topics  . Alcohol use: No     Alcohol/week: 0.0 standard drinks  . Drug use: Yes    Frequency: 7.0 times per week    Types: Marijuana    Comment: took this am, history of crack cocaine abuse     Allergies   Antihistamines, chlorpheniramine-type; Hydrocodone; and Sulfa antibiotics   Review of Systems Review of Systems  Constitutional: Negative for chills and fever.  HENT: Negative for facial swelling and sore throat.   Eyes: Positive for photophobia. Negative for visual disturbance.  Respiratory: Negative for shortness of breath.   Cardiovascular: Negative for chest pain.  Gastrointestinal: Negative for abdominal pain (chronic, she reports her doctor is working this up), nausea and vomiting.  Genitourinary: Negative for dysuria.  Musculoskeletal: Positive for neck pain. Negative for back pain (some chronic at baseline, none new).  Skin: Negative for rash and wound.  Neurological: Positive for light-headedness and headaches. Negative for syncope.  Psychiatric/Behavioral: The patient is not nervous/anxious.      Physical Exam Updated Vital Signs BP 99/78 (BP Location: Right Arm)   Pulse 68   Temp 99 F (37.2 C) (Oral)   Resp 18   LMP 11/03/2012   SpO2 99%   Physical Exam Vitals signs and nursing note reviewed.  Constitutional:      General: She is not in acute distress.    Appearance: She is well-developed. She is not diaphoretic.  HENT:     Head: Normocephalic and atraumatic.      Mouth/Throat:     Pharynx: No oropharyngeal exudate.  Eyes:     General: No scleral icterus.       Right eye: No discharge.        Left eye: No discharge.     Conjunctiva/sclera: Conjunctivae normal.     Pupils: Pupils are equal, round, and reactive to light.  Neck:     Musculoskeletal: Normal range of motion and neck supple.     Thyroid: No thyromegaly.  Cardiovascular:     Rate and Rhythm: Normal rate and regular rhythm.     Heart sounds: Normal heart sounds. No murmur. No friction rub. No gallop.   Pulmonary:      Effort: Pulmonary effort is normal. No respiratory distress.     Breath sounds: Normal breath sounds. No stridor. No wheezing or rales.  Abdominal:     General: Bowel sounds are normal. There is no distension.     Palpations: Abdomen is soft.     Tenderness: There is no abdominal tenderness. There is no guarding or rebound.  Musculoskeletal:     Comments: Midline cervical tenderness, no midline thoracic or lumbar tenderness  Lymphadenopathy:     Cervical: No cervical adenopathy.  Skin:    General: Skin is warm and dry.     Coloration: Skin is not pale.     Findings: No rash.  Neurological:     Mental Status: She is alert.     Coordination: Coordination normal.      ED Treatments / Results  Labs (all labs ordered are listed, but only abnormal results are displayed) Labs Reviewed - No data to display  EKG None  Radiology No results found.  Procedures Procedures (including critical care time)  Medications Ordered in ED Medications - No data to display   Initial Impression / Assessment and Plan / ED Course  I have reviewed the triage vital signs and the nursing notes.  Pertinent labs & imaging results that were available during my care of the patient were reviewed by me and considered in my medical decision making (see chart for details).     Patient presenting with ongoing headache and lightheadedness in association with photophobia after fall.  She also has midline cervical tenderness.  CT head and cervical spine are pending at shift change. At shift change, patient care transferred to [facility], PA-C for continued evaluation, follow up of CTs and determination of disposition. Anticipate discharge if CT is negative with muscle relaxer and supportive treatment for headache.  Final Clinical Impressions(s) / ED Diagnoses   Final diagnoses:  Minor head injury, initial encounter  Neck pain    ED Discharge Orders    None       Frederica Kuster,  PA-C 12/17/18 [place], DO 12/17/18 1524

## 2018-12-17 NOTE — ED Provider Notes (Signed)
Patient care assumed from Armstead Peaks, PA-C with plan to follow-up on imaging of head and C-spine after a fall that she sustained 6 days ago.  If imaging is negative, plan for discharge home.  Per her note, "Tasha [name] is a 62 y.o. female with history of degenerative disc disease, hypertension, hyperlipidemia who presents with headache and lightheadedness for the past 6 days after falling.  Patient reports she was bending over to tie her shoe when she fell.  She had her head.  She did not lose consciousness.  She reports she has had persistent tenderness where she hit her head and also generalized headache and photophobia.  She is also had lightheadedness intermittently.  She denies any vision changes, numbness or tingling.  She does have some associated neck pain.  Patient denies any chest pain, shortness of breath, new abdominal pain, nausea, vomiting."   Physical Exam  BP 99/78 (BP Location: Right Arm)   Pulse 68   Temp 99 F (37.2 C) (Oral)   Resp 18   LMP 11/03/2012   SpO2 99%   Physical Exam  Unable to complete secondary to patient eloping from ED.  ED Course/Procedures     Procedures Results for orders placed or performed during the hospital encounter of 10/21/18  Comprehensive metabolic panel  Result Value Ref Range   Sodium 144 135 - 145 mmol/L   Potassium 3.6 3.5 - 5.1 mmol/L   Chloride 113 (H) 98 - 111 mmol/L   CO2 21 (L) 22 - 32 mmol/L   Glucose, Bld 86 70 - 99 mg/dL   BUN 23 8 - 23 mg/dL   Creatinine, Ser 0.90 0.44 - 1.00 mg/dL   Calcium 9.5 8.9 - 10.3 mg/dL   Total Protein 7.3 6.5 - 8.1 g/dL   Albumin 3.8 3.5 - 5.0 g/dL   AST 19 15 - 41 U/L   ALT 19 0 - 44 U/L   Alkaline Phosphatase 84 38 - 126 U/L   Total Bilirubin 0.5 0.3 - 1.2 mg/dL   GFR calc non Af Amer >60 >60 mL/min   GFR calc Af Amer >60 >60 mL/min   Anion gap 10 5 - 15   Ct Head Wo Contrast  Result Date: 12/17/2018 CLINICAL DATA:  Headache for 2 weeks since a fall. Initial encounter. EXAM: CT HEAD  WITHOUT CONTRAST CT CERVICAL SPINE WITHOUT CONTRAST TECHNIQUE: Multidetector CT imaging of the head and cervical spine was performed following the standard protocol without intravenous contrast. Multiplanar CT image reconstructions of the cervical spine were also generated. COMPARISON:  CT cervical spine 12/05/2014. CT angiogram of the neck 12/05/2014 and 10/21/2018. FINDINGS: CT HEAD FINDINGS Brain: No evidence of acute infarction, hemorrhage, hydrocephalus, extra-axial collection or mass lesion/mass effect. Vascular: No hyperdense vessel or unexpected calcification. Skull: Intact.  No focal lesion. Sinuses/Orbits: Negative. Other: None. CT CERVICAL SPINE FINDINGS Alignment: Maintained with straightening of lordosis noted. Skull base and vertebrae: No acute fracture. No primary bone lesion or focal pathologic process. Status post C4-7 fusion as seen on the prior exams. Soft tissues and spinal canal: No prevertebral fluid or swelling. No visible canal hematoma. Disc levels:  No focal abnormality. Upper chest: There is some emphysematous change in the apices. 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 IMPRESSION: No acute abnormality head or cervical spine. Status post C4-7 fusion. 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  Electronically Signed   By: Inge Rise M.D.   On: 12/17/2018 15:29   Ct Cervical Spine Wo Contrast  Result Date: 12/17/2018 CLINICAL DATA:  Headache for 2 weeks since a fall. Initial encounter. EXAM: CT HEAD WITHOUT CONTRAST CT CERVICAL SPINE WITHOUT CONTRAST TECHNIQUE: Multidetector CT imaging of the head and cervical spine was performed following the standard protocol without intravenous contrast. Multiplanar CT image reconstructions of the cervical spine were also generated. COMPARISON:  CT cervical spine 12/05/2014. CT angiogram of the neck  12/05/2014 and 10/21/2018. FINDINGS: CT HEAD FINDINGS Brain: No evidence of acute infarction, hemorrhage, hydrocephalus, extra-axial collection or mass lesion/mass effect. Vascular: No hyperdense vessel or unexpected calcification. Skull: Intact.  No focal lesion. Sinuses/Orbits: Negative. Other: None. CT CERVICAL SPINE FINDINGS Alignment: Maintained with straightening of lordosis noted. Skull base and vertebrae: No acute fracture. No primary bone lesion or focal pathologic process. Status post C4-7 fusion as seen on the prior exams. Soft tissues and spinal canal: No prevertebral fluid or swelling. No visible canal hematoma. Disc levels:  No focal abnormality. Upper chest: There is some emphysematous change in the apices. 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 IMPRESSION: No acute abnormality head or cervical spine. Status post C4-7 fusion. 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 Electronically Signed   By: Inge Rise M.D.   On: 12/17/2018 15:29   US Abdomen Limited Ruq  Result Date: 12/14/2018 CLINICAL DATA:  Right upper quadrant pain EXAM: ULTRASOUND ABDOMEN LIMITED RIGHT UPPER QUADRANT COMPARISON:  None. FINDINGS: Gallbladder: Prior cholecystectomy Common bile duct: Diameter: Normal caliber, 2 mm Liver: Heterogeneous, increased echotexture suggesting fatty infiltration or intrinsic liver disease. No focal hepatic abnormality or biliary ductal dilatation. Portal vein is patent on color Doppler imaging with normal direction of blood flow towards the liver. IMPRESSION: Heterogeneous, increased echotexture throughout the liver compatible with fatty infiltration or intrinsic liver disease. Electronically Signed   By: Rolm Baptise M.D.   On: 12/14/2018 10:22     MDM   Patient presenting after fall that occurred 6 days ago.  Care signed out pending imaging of  the head and neck.  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  On review of records, patient currently follows with radiation oncology for this.  Prior to informing the patient of her results, she left the emergency department stating that she had to go pick up a family member.  Nursing attempted to call the patient to inform her of the results however was unable to contact the patient.       Rodney Booze, PA-C 12/17/18 [place], [place], DO 12/17/18 1750

## 2018-12-17 NOTE — ED Triage Notes (Signed)
Pt states she fell 2 weeks ago and hit her head. She states she has continued to have head pain with some dizziness, aoX4, ambulatory. Denies use of blood thinners.

## 2018-12-23 ENCOUNTER — Telehealth: Payer: Self-pay

## 2018-12-23 NOTE — Telephone Encounter (Signed)
Called and spoke to pt. Informed her of her colonoscopy at [facility] (difficult IV placement) on Tues 2-4 and her previsit on Mon, 1-20 at 11:00am. She expressed understanding and wrote all her dates and times down.  She has our number in case she has more questions.

## 2019-01-12 ENCOUNTER — Encounter: Payer: Self-pay | Admitting: *Deleted

## 2019-01-12 LAB — LIPID PANEL
CHOLESTEROL TOTAL: 161 mg/dL (ref 100–199)
Chol/HDL Ratio: 2.8 ratio (ref 0.0–4.4)
HDL: 57 mg/dL (ref >39)
LDL Calculated: 92 mg/dL (ref 0–99)
Triglycerides: 59 mg/dL (ref 0–149)
VLDL CHOLESTEROL CAL: 12 mg/dL (ref 5–40)

## 2019-01-12 LAB — HEPATIC FUNCTION PANEL
ALK PHOS: 88 IU/L (ref 39–117)
ALT: 17 IU/L (ref 0–32)
AST: 14 IU/L (ref 0–40)
Albumin: 3.9 g/dL (ref 3.8–4.8)
Bilirubin Total: 0.4 mg/dL (ref 0.0–1.2)
Bilirubin, Direct: 0.11 mg/dL (ref 0.00–0.40)
TOTAL PROTEIN: 6.8 g/dL (ref 6.0–8.5)

## 2019-01-18 ENCOUNTER — Telehealth: Payer: Self-pay | Admitting: *Deleted

## 2019-01-18 NOTE — Telephone Encounter (Addendum)
Left message for pt to call   ----- Message from Lorretta Harp, MD sent at 01/17/2019  2:02 PM EST ----- Not quite at goal on Livalo 4 mg for secondary prevention.  Add Zetia 10 mg and recheck

## 2019-01-20 ENCOUNTER — Telehealth: Payer: Self-pay | Admitting: Cardiovascular Disease

## 2019-01-20 MED ORDER — EZETIMIBE 10 MG PO TABS: 10.0000 mg | ORAL_TABLET | Freq: Every day | ORAL | 3 refills | Status: AC

## 2019-01-20 NOTE — Telephone Encounter (Signed)
Follow up:    Patient returning call from a few days concerning some results. Please call patient.

## 2019-01-20 NOTE — Telephone Encounter (Signed)
Spoke with pt, she thinks the livalo is causing her to have headaches. She has found out that due to insurance changes she will need to see a physician at [place]. She would like to know if dr berry know anyone at [facility] or [facility]. zetia script sent to the pharmacy.

## 2019-01-20 NOTE — Telephone Encounter (Signed)
Acknowledged. Nurse aware.

## 2019-01-20 NOTE — Telephone Encounter (Signed)
New Message   Calling to let doctor know medication Livalo was approved by [employer]

## 2019-01-21 NOTE — Telephone Encounter (Signed)
Don't know anyone to refer her to  JJB

## 2019-01-21 NOTE — Telephone Encounter (Signed)
Left message for patient of dr berry's comments.

## 2019-01-25 ENCOUNTER — Ambulatory Visit (HOSPITAL_COMMUNITY): Admit: 2019-01-25 | Payer: BLUE CROSS/BLUE SHIELD | Admitting: Gastroenterology

## 2019-01-25 ENCOUNTER — Encounter (HOSPITAL_COMMUNITY): Payer: Self-pay

## 2019-01-25 SURGERY — COLONOSCOPY WITH PROPOFOL
Anesthesia: Monitor Anesthesia Care

## 2019-01-29 ENCOUNTER — Emergency Department (HOSPITAL_COMMUNITY)
Admission: EM | Admit: 2019-01-29 | Discharge: 2019-01-29 | Disposition: A | Discharge status: Home / Self Care | Payer: BLUE CROSS/BLUE SHIELD | Attending: Emergency Medicine | Admitting: Emergency Medicine

## 2019-01-29 ENCOUNTER — Emergency Department (HOSPITAL_COMMUNITY): Payer: BLUE CROSS/BLUE SHIELD

## 2019-01-29 DIAGNOSIS — I10 Essential (primary) hypertension: Secondary | ICD-10-CM | POA: Diagnosis not present

## 2019-01-29 DIAGNOSIS — R51 Headache: Secondary | ICD-10-CM | POA: Diagnosis present

## 2019-01-29 DIAGNOSIS — Z7982 Long term (current) use of aspirin: Secondary | ICD-10-CM | POA: Diagnosis not present

## 2019-01-29 DIAGNOSIS — Z87891 Personal history of nicotine dependence: Secondary | ICD-10-CM | POA: Insufficient documentation

## 2019-01-29 DIAGNOSIS — Z79899 Other long term (current) drug therapy: Secondary | ICD-10-CM | POA: Insufficient documentation

## 2019-01-29 DIAGNOSIS — M542 Cervicalgia: Secondary | ICD-10-CM | POA: Diagnosis not present

## 2019-01-29 LAB — BASIC METABOLIC PANEL
Anion gap: 11 (ref 5–15)
BUN: 19 mg/dL (ref 8–23)
CHLORIDE: 111 mmol/L (ref 98–111)
CO2: 18 mmol/L — ABNORMAL LOW (ref 22–32)
Calcium: 9.5 mg/dL (ref 8.9–10.3)
Creatinine, Ser: 0.94 mg/dL (ref 0.44–1.00)
GFR calc Af Amer: >60 mL/min (ref >60)
GFR calc non Af Amer: >60 mL/min (ref >60)
Glucose, Bld: 82 mg/dL (ref 70–99)
Potassium: 4.4 mmol/L (ref 3.5–5.1)
SODIUM: 140 mmol/L (ref 135–145)

## 2019-01-29 LAB — CBC
HCT: 36.2 % (ref 36.0–46.0)
Hemoglobin: 11.2 g/dL — ABNORMAL LOW (ref 12.0–15.0)
MCH: 28.9 pg (ref 26.0–34.0)
MCHC: 30.9 g/dL (ref 30.0–36.0)
MCV: 93.5 fL (ref 80.0–100.0)
Platelets: 198 10*3/uL (ref 150–400)
RBC: 3.87 MIL/uL (ref 3.87–5.11)
RDW: 14.7 % (ref 11.5–15.5)
WBC: 5.8 10*3/uL (ref 4.0–10.5)
nRBC: 0 % (ref 0.0–0.2)

## 2019-01-29 MED ORDER — KETOROLAC TROMETHAMINE 30 MG/ML IJ SOLN
30.0000 mg | Freq: Once | INTRAMUSCULAR | Status: AC
  Administered 2019-01-29: 30 mg via INTRAVENOUS
  Filled 2019-01-29: qty 1

## 2019-01-29 MED ORDER — PROCHLORPERAZINE EDISYLATE 10 MG/2ML IJ SOLN
10.0000 mg | Freq: Once | INTRAMUSCULAR | Status: AC
  Administered 2019-01-29: 10 mg via INTRAVENOUS
  Filled 2019-01-29: qty 2

## 2019-01-29 NOTE — ED Notes (Addendum)
Pt stated she had to leave to pick up her kids. Refused vitals. Pt would not wait for d/c papers

## 2019-01-29 NOTE — ED Triage Notes (Signed)
Patient c/o R sided headache radiating to R side of neck intermittently for the past week - states she's just been ignoring it. She also reports numbness/tingling to R arm and hand. No facial droop, extremity weakness, or visual deficits. She states her pain is more of a sore feeling, like she had gotten punched.

## 2019-01-29 NOTE — ED Provider Notes (Signed)
[facility] EMERGENCY DEPARTMENT Provider Note   CSN: [id number] Arrival date & time: 01/29/19  1315     History   Chief Complaint Chief Complaint  Patient presents with  . Headache  . Neck Pain    HPI Tasha [name] is a 63 y.o. female.  HPI Patient states she is had difficulty with headaches and neck pain for the last couple of weeks.  Patient states she has a history of a narrowing in her carotid artery.  According to her medical records the patient has a mass that involves the medial left carotid space and right parapharyngeal space.  The mass causes the right external and internal carotid arteries to be splayed.  Patient states that she has had problems with pain on the right side of her neck that goes up towards her head and face for the last couple weeks.  Symptoms are intermittent.  Days she was experiencing that pain again so she decided to come to the ED to be checked out.  She was told in the past to monitor for any signs of headache.  Patient denies any fevers.  She denies any vomiting.  She to me having any focal weakness in her face.  She denies any weakness in her extremities.  She also denies any numbness currently in her face or hands or legs.  She has had no difficulty with her speech.  No trouble with her balance or coordination. Past Medical History:  Diagnosis Date  . DDD (degenerative disc disease)    DJD.  spine.  chronic pain.   . Fibroids   . Hyperlipidemia   . Hypertension   . Hypokalemia 11/26/2017  . Pyelonephritis 2004  . Vertigo 2014   positional.  Eval with Dr Jannifer Franklin, neuro, 2014.     Patient Active Problem List   Diagnosis Date Noted  . Dyspnea on exertion 06/08/2018  . Acute pain of right shoulder 08/20/2016  . Left-sided carotid artery disease ([place]) 06/17/2016  . Essential hypertension 06/17/2016  . Hyperlipidemia 06/17/2016  . Chest pain 06/17/2016  . Palpitations 06/17/2016  . 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  Hypokalemia 07/26/2014  . Constipation 07/26/2014  . BRBPR (bright red blood per rectum) 07/26/2014  . Hematochezia 07/26/2014  . Benign paroxysmal positional vertigo 05/12/2014  . Dizziness and giddiness 09/27/2013  . Uterine leiomyoma 03/03/2013  . Postmenopausal bleeding 03/03/2013  . Pelvic pain 03/03/2013  . 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 Low back pain 10/23/2011    Past Surgical History:  Procedure Laterality Date  . CESAREAN SECTION    . CHOLECYSTECTOMY  2001  . ERCP W/ SPHICTEROTOMY  2001   removal CBD stones  . FOOT SURGERY Right 2017  . NECK SURGERY     cadaver bones put in her neck  . SKIN BIOPSY  12/2009, 0/8657   8469 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      OB History    Gravida  4   Para  1   Term  1   Preterm      AB  3   Living  1     SAB  2   TAB      Ectopic      Multiple      Live Births  1            Home Medications    Prior to Admission medications  Medication Sig Start Date End Date Taking? Authorizing Provider  aspirin EC 81 MG tablet Take 1 tablet (81 mg total) by mouth daily. 06/01/17   Ledell Noss, MD  EDARBI 80 MG TABS  06/11/18   [provider]  ezetimibe (ZETIA) 10 MG tablet Take 1 tablet (10 mg total) by mouth daily. 01/20/19 04/20/19  Lorretta Harp, MD  furosemide (LASIX) 20 MG tablet Take 1 tablet by mouth daily. 03/03/18   [provider]  ibuprofen (ADVIL,MOTRIN) 800 MG tablet Take 800 mg by mouth 3 (three) times daily. 06/06/18   [provider]  LIVALO 4 MG TABS TAKE 1 TABLET BY MOUTH EVERY DAY 08/16/18   Lorretta Harp, MD  potassium chloride SA (K-DUR,KLOR-CON) 20 MEQ tablet Take 1 tablet (20 mEq total) by mouth daily. 11/27/17   Regalado, Belkys A, MD  traMADol (ULTRAM) 50 MG tablet TAKE 1 TO 2 TABLETS TWICE A DAY 06/06/18   [provider]    Family  History Family History  Problem Relation Age of Onset  . Hypertension Mother   . Cancer Mother   . Hyperlipidemia Mother   . Pancreatic cancer Mother   . Heart disease Father   . Cancer Father   . Heart attack Father   . Rectal cancer Neg Hx   . Stomach cancer Neg Hx   . Colon cancer Neg Hx     Social History Social History   Tobacco Use  . Smoking status: Former Smoker    Last attempt to quit: 12/22/2008    Years since quitting: 10.1  . Smokeless tobacco: Never Used  Substance Use Topics  . Alcohol use: No    Alcohol/week: 0.0 standard drinks  . Drug use: Yes    Frequency: 7.0 times per week    Types: Marijuana    Comment: took this am, history of crack cocaine abuse     Allergies   Antihistamines, chlorpheniramine-type; Hydrocodone; and Sulfa antibiotics   Review of Systems Review of Systems  All other systems reviewed and are negative.    Physical Exam Updated Vital Signs BP 110/84 (BP Location: Right Arm)   Pulse 72   Temp 97.6 F (36.4 C) (Oral)   Resp 18   LMP 11/03/2012   SpO2 98%   Physical Exam Vitals signs and nursing note reviewed.  Constitutional:      General: She is not in acute distress.    Appearance: She is well-developed.  HENT:     Head: Normocephalic and atraumatic.     Right Ear: External ear normal.     Left Ear: External ear normal.  Eyes:     General: No scleral icterus.       Right eye: No discharge.        Left eye: No discharge.     Conjunctiva/sclera: Conjunctivae normal.  Neck:     Musculoskeletal: Neck supple. No edema, neck rigidity or pain with movement.     Thyroid: No thyroid mass.     Vascular: No carotid bruit.     Trachea: No tracheal deviation.  Cardiovascular:     Rate and Rhythm: Normal rate and regular rhythm.  Pulmonary:     Effort: Pulmonary effort is normal. No respiratory distress.     Breath sounds: Normal breath sounds. No stridor. No wheezing or rales.  Abdominal:     General: Bowel sounds are  normal. There is no distension.     Palpations: Abdomen is soft.     Tenderness:  There is no abdominal tenderness. There is no guarding or rebound.  Musculoskeletal:        General: No tenderness.  Skin:    General: Skin is warm and dry.     Findings: No rash.  Neurological:     Mental Status: She is alert and oriented to person, place, and time.     Cranial Nerves: No cranial nerve deficit (No facial droop, extraocular movements intact, tongue midline ).     Sensory: No sensory deficit.     Motor: No abnormal muscle tone or seizure activity.     Coordination: Coordination normal.     Comments: No pronator drift bilateral upper extrem, able to hold both legs off bed for 5 seconds, sensation intact in all extremities, no visual field cuts, no left or right sided neglect, normal finger-nose exam bilaterally, no nystagmus noted       ED Treatments / Results  Labs (all labs ordered are listed, but only abnormal results are displayed) Labs Reviewed  BASIC METABOLIC PANEL - Abnormal; Notable for the following components:      Result Value   CO2 18 (*)    All other components within normal limits  CBC - Abnormal; Notable for the following components:   Hemoglobin 11.2 (*)    All other components within normal limits    EKG EKG Interpretation  Date/Time:  Saturday January 29 2019 13:28:22 EST Ventricular Rate:  68 PR Interval:    QRS Duration: 88 QT Interval:  360 QTC Calculation: 383 R Axis:   38 Text Interpretation:  Sinus rhythm Minimal ST elevation, anterior leads No significant change since last tracing Confirmed by Dorie Rank [phone]) on 01/29/2019 1:34:17 PM   Radiology Dg Cervical Spine Complete  Result Date: 01/29/2019 CLINICAL DATA:  Right-sided headache radiating to the right side of the neck. EXAM: CERVICAL SPINE - COMPLETE 4+ VIEW COMPARISON:  Brain and cervical spine CT 12/17/2018 FINDINGS: Visualization through the superior C7 endplate on lateral view. C4-C7  anterior cervical spinal fusion hardware is demonstrated. No evidence for acute fracture. Lateral masses articulate with the dens. Lung apices are clear. IMPRESSION: No acute osseous abnormality. Anterior cervical spinal fusion C4-C7. Electronically Signed   By: Lovey Newcomer M.D.   On: 01/29/2019 14:46    Procedures Procedures (including critical care time)  Medications Ordered in ED Medications  prochlorperazine (COMPAZINE) injection 10 mg (10 mg Intravenous Given 01/29/19 1357)  ketorolac (TORADOL) 30 MG/ML injection 30 mg (30 mg Intravenous Given 01/29/19 1357)     Initial Impression / Assessment and Plan / ED Course  I have reviewed the triage vital signs and the nursing notes.  Pertinent labs & imaging results that were available during my care of the patient were reviewed by me and considered in my medical decision making (see chart for details).   Patient presented with complaints of neck discomfort.  She was concerned because she has a history of stenosis of her carotid artery.  Patient's neurologic exam was normal.  No findings to suggest stroke or TIA.  She did not have a bruit on exam.  There is no mass.  Her symptoms seem to be more musculoskeletal in nature.  Laboratory tests are reassuring.  She does have a slightly decreased bicarb but that is similar to previous values.  I went to discuss the patient's results with her and she was not at the bed.  I believe she eloped.  I was planning on discharging her.  Final Clinical Impressions(s) /  ED Diagnoses   Final diagnoses:  Neck pain    ED Discharge Orders    None       Dorie Rank, MD 01/29/19 [phone]

## 2019-04-14 ENCOUNTER — Emergency Department (HOSPITAL_COMMUNITY)
Admission: EM | Admit: 2019-04-14 | Discharge: 2019-04-14 | Discharge status: Against Medical Advice | Payer: BLUE CROSS/BLUE SHIELD | Attending: Emergency Medicine | Admitting: Emergency Medicine

## 2019-04-14 ENCOUNTER — Other Ambulatory Visit: Payer: Self-pay

## 2019-04-14 ENCOUNTER — Emergency Department (HOSPITAL_COMMUNITY): Payer: BLUE CROSS/BLUE SHIELD

## 2019-04-14 ENCOUNTER — Encounter (HOSPITAL_COMMUNITY): Payer: Self-pay

## 2019-04-14 DIAGNOSIS — Z9049 Acquired absence of other specified parts of digestive tract: Secondary | ICD-10-CM | POA: Diagnosis not present

## 2019-04-14 DIAGNOSIS — Z79899 Other long term (current) drug therapy: Secondary | ICD-10-CM | POA: Diagnosis not present

## 2019-04-14 DIAGNOSIS — R079 Chest pain, unspecified: Secondary | ICD-10-CM | POA: Diagnosis not present

## 2019-04-14 DIAGNOSIS — Z87891 Personal history of nicotine dependence: Secondary | ICD-10-CM | POA: Diagnosis not present

## 2019-04-14 DIAGNOSIS — Z7982 Long term (current) use of aspirin: Secondary | ICD-10-CM | POA: Diagnosis not present

## 2019-04-14 DIAGNOSIS — M542 Cervicalgia: Secondary | ICD-10-CM | POA: Diagnosis not present

## 2019-04-14 DIAGNOSIS — I10 Essential (primary) hypertension: Secondary | ICD-10-CM | POA: Diagnosis not present

## 2019-04-14 DIAGNOSIS — R51 Headache: Secondary | ICD-10-CM | POA: Diagnosis present

## 2019-04-14 LAB — CBC WITH DIFFERENTIAL/PLATELET
Abs Immature Granulocytes: 0.01 10*3/uL (ref 0.00–0.07)
Basophils Absolute: 0 10*3/uL (ref 0.0–0.1)
Basophils Relative: 0 %
Eosinophils Absolute: 0.1 10*3/uL (ref 0.0–0.5)
Eosinophils Relative: 2 %
HCT: 33.8 % — ABNORMAL LOW (ref 36.0–46.0)
Hemoglobin: 11.1 g/dL — ABNORMAL LOW (ref 12.0–15.0)
Immature Granulocytes: 0 %
Lymphocytes Relative: 31 %
Lymphs Abs: 1.6 10*3/uL (ref 0.7–4.0)
MCH: 29.7 pg (ref 26.0–34.0)
MCHC: 32.8 g/dL (ref 30.0–36.0)
MCV: 90.4 fL (ref 80.0–100.0)
Monocytes Absolute: 0.3 10*3/uL (ref 0.1–1.0)
Monocytes Relative: 6 %
Neutro Abs: 3.1 10*3/uL (ref 1.7–7.7)
Neutrophils Relative %: 61 %
Platelets: 207 10*3/uL (ref 150–400)
RBC: 3.74 MIL/uL — ABNORMAL LOW (ref 3.87–5.11)
RDW: 14.3 % (ref 11.5–15.5)
WBC: 5.2 10*3/uL (ref 4.0–10.5)
nRBC: 0 % (ref 0.0–0.2)

## 2019-04-14 LAB — BASIC METABOLIC PANEL
Anion gap: 8 (ref 5–15)
BUN: 15 mg/dL (ref 8–23)
CO2: 20 mmol/L — ABNORMAL LOW (ref 22–32)
Calcium: 9.2 mg/dL (ref 8.9–10.3)
Chloride: 113 mmol/L — ABNORMAL HIGH (ref 98–111)
Creatinine, Ser: 0.67 mg/dL (ref 0.44–1.00)
GFR calc Af Amer: >60 mL/min (ref >60)
GFR calc non Af Amer: >60 mL/min (ref >60)
Glucose, Bld: 105 mg/dL — ABNORMAL HIGH (ref 70–99)
Potassium: 3.7 mmol/L (ref 3.5–5.1)
Sodium: 141 mmol/L (ref 135–145)

## 2019-04-14 LAB — TROPONIN I
Troponin I: 0.03 ng/mL (ref <0.03)
Troponin I: 0.03 ng/mL (ref <0.03)

## 2019-04-14 MED ORDER — IOHEXOL 350 MG/ML SOLN
75.0000 mL | Freq: Once | INTRAVENOUS | Status: AC | PRN
  Administered 2019-04-14: 14:00:00 75 mL via INTRAVENOUS

## 2019-04-14 NOTE — ED Provider Notes (Signed)
Physical Exam  BP 132/77   Pulse 73   Temp 99 F (37.2 C) (Oral)   Resp (!) 23   LMP 11/03/2012   SpO2 97%   Assumed care from Dr. Ralene Bathe at 1130. Briefly, the patient is a 63 y.o. female with PMHx of  has a past medical history of DDD (degenerative disc disease), Fibroids, Hyperlipidemia, Hypertension, Hypokalemia (11/26/2017), Pyelonephritis (2004), and Vertigo (2014). here with episode of left-sided chest pain, arm pain, and left-sided neck pain overnight.  Please see Dr. Kallie Edward note for full history and physical.  Labs Reviewed  BASIC METABOLIC PANEL - Abnormal; Notable for the following components:      Result Value   Chloride 113 (*)    CO2 20 (*)    Glucose, Bld 105 (*)    All other components within normal limits  CBC WITH DIFFERENTIAL/PLATELET - Abnormal; Notable for the following components:   RBC 3.74 (*)    Hemoglobin 11.1 (*)    HCT 33.8 (*)    All other components within normal limits  TROPONIN I - Abnormal; Notable for the following components:   Troponin I 0.03 (*)    All other components within normal limits  TROPONIN I - Abnormal; Notable for the following components:   Troponin I 0.03 (*)    All other components within normal limits    Course of Care:   Physical Exam Vitals signs and nursing note reviewed.  Constitutional:      General: She is not in acute distress.    Appearance: She is well-developed. She is not diaphoretic.     Comments: Sitting comfortably on side of bed in no distress.  HENT:     Head: Normocephalic and atraumatic.  Eyes:     General:        Right eye: No discharge.        Left eye: No discharge.     Conjunctiva/sclera: Conjunctivae normal.     Comments: EOMs normal to gross examination.  Neck:     Musculoskeletal: Normal range of motion.  Cardiovascular:     Rate and Rhythm: Normal rate and regular rhythm.     Comments: Intact, 2+ radial pulse. Abdominal:     General: There is no distension.  Musculoskeletal: Normal  range of motion.  Skin:    General: Skin is warm and dry.  Neurological:     Mental Status: She is alert.     Comments: Cranial nerves intact to gross observation. Patient moves extremities without difficulty.  Psychiatric:        Behavior: Behavior normal.        Thought Content: Thought content normal.        Judgment: Judgment normal.     ED Course/Procedures   Clinical Course as of Apr 13 1505  Thu Apr 14, 2019  1403 Delta troponin is stable. Do not suspect ACS.   Troponin I(!!): 0.03 [AM]  1418 Pt states she could not wait. Will call pt with results.    [AM]    Clinical Course User Index [AM] Albesa Seen, PA-C    Procedures  MDM   Patient is well-appearing in no acute distress.  Patient is anxious to go home.  She is pain-free on my evaluation.  Thus far, patient has had a reassuring work-up with nonischemic EKG and troponin of 0.03 which is at the level it has been elevated in the past.  Will repeat troponin and CT angiogram of head and neck are pending  to rule out dissection.  Patient had to leave to get on conference call at work, and ultimately left prior to angiogram results came back.  She is understanding of the risks of leaving before all results are back.  She is encouraged to return if her symptoms return.  1607 I was able to reach patient by phone to give her the results of her CT Angio of head and neck.  These are reassuring results.  I encouraged the patient to follow-up with her vascular surgeon at [facility] regarding the interval imaging of her carotid arteries and carotid body paraganglioma.      Albesa Seen, PA-C 04/14/19 1621    Quintella Reichert, MD 04/15/19 [phone]

## 2019-04-14 NOTE — ED Notes (Addendum)
Patient verbalizes understanding of leaving AMA.  Opportunity for questioning and answers were provided. Armband removed by staff, pt discharged AMA from ED.    EDP and this RN at bedside to encourage pt to stay for CTA results.  Pt declined.

## 2019-04-14 NOTE — ED Provider Notes (Signed)
[facility] EMERGENCY DEPARTMENT Provider Note   CSN: [id number] Arrival date & time: 04/14/19  6314    History   Chief Complaint Chief Complaint  Patient presents with  . Chest Pain    HPI Tasha [name] is a 63 y.o. female.     The history is provided by the patient and medical records. No language interpreter was used.  Chest Pain   Tasha [name] is a 63 y.o. female who presents to the Emergency Department complaining of chest pain/HA. He presents to the emergency department complaining of headache, chest pain. Her symptoms began yesterday. She was laying in her bed on her left side with her head resting on her left arm. She developed tingling and pain to the left arm and she sat up and massage the area. The pain and tingling then spread to her left chest wall and she massage the area there as well. After vigorously rubbing the area she develops tingling and burning pain to the left neck and left temple. Her symptoms overall lasted only about 20 seconds but was very unusual for her because she never has headaches. She denies any current symptoms but wanted to get checked out due to her concerning symptoms yesterday. No visual changes, fevers, cough, shortness of breath, nausea, vomiting, weakness. Her pain in her chest improved on ambulation. She states that she does have a history of carotid stenosis in the left side. Past Medical History:  Diagnosis Date  . DDD (degenerative disc disease)    DJD.  spine.  chronic pain.   . Fibroids   . Hyperlipidemia   . Hypertension   . Hypokalemia 11/26/2017  . Pyelonephritis 2004  . Vertigo 2014   positional.  Eval with Dr Jannifer Franklin, neuro, 2014.     Patient Active Problem List   Diagnosis Date Noted  . Dyspnea on exertion 06/08/2018  . Acute pain of right shoulder 08/20/2016  . Left-sided carotid artery disease ([place]) 06/17/2016  . Essential hypertension 06/17/2016  . Hyperlipidemia 06/17/2016  . Chest pain 06/17/2016  .  Palpitations 06/17/2016  . 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 Hypokalemia 07/26/2014  . Constipation 07/26/2014  . BRBPR (bright red blood per rectum) 07/26/2014  . Hematochezia 07/26/2014  . Benign paroxysmal positional vertigo 05/12/2014  . Dizziness and giddiness 09/27/2013  . Uterine leiomyoma 03/03/2013  . Postmenopausal bleeding 03/03/2013  . Pelvic pain 03/03/2013  . 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 Low back pain 10/23/2011    Past Surgical History:  Procedure Laterality Date  . CESAREAN SECTION    . CHOLECYSTECTOMY  2001  . ERCP W/ SPHICTEROTOMY  2001   removal CBD stones  . FOOT SURGERY Right 2017  . NECK SURGERY     cadaver bones put in her neck  . SKIN BIOPSY  12/2009, 08/7025   3785 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      OB History    Gravida  4   Para  1   Term  1   Preterm      AB  3   Living  1     SAB  2   TAB      Ectopic      Multiple      Live Births  1            Home Medications    Prior to Admission medications   Medication Sig Start  Date End Date Taking? Authorizing Provider  aspirin EC 81 MG tablet Take 1 tablet (81 mg total) by mouth daily. 06/01/17   Ledell Noss, MD  EDARBI 80 MG TABS  06/11/18   [provider]  ezetimibe (ZETIA) 10 MG tablet Take 1 tablet (10 mg total) by mouth daily. 01/20/19 04/20/19  Lorretta Harp, MD  furosemide (LASIX) 20 MG tablet Take 1 tablet by mouth daily. 03/03/18   [provider]  ibuprofen (ADVIL,MOTRIN) 800 MG tablet Take 800 mg by mouth 3 (three) times daily. 06/06/18   [provider]  LIVALO 4 MG TABS TAKE 1 TABLET BY MOUTH EVERY DAY 08/16/18   Lorretta Harp, MD  potassium chloride SA (K-DUR,KLOR-CON) 20 MEQ tablet Take 1 tablet (20 mEq total) by mouth daily. 11/27/17   Regalado, Belkys A, MD  traMADol (ULTRAM) 50 MG tablet TAKE 1 TO 2 TABLETS  TWICE A DAY 06/06/18   [provider]    Family History Family History  Problem Relation Age of Onset  . Hypertension Mother   . Cancer Mother   . Hyperlipidemia Mother   . Pancreatic cancer Mother   . Heart disease Father   . Cancer Father   . Heart attack Father   . Rectal cancer Neg Hx   . Stomach cancer Neg Hx   . Colon cancer Neg Hx     Social History Social History   Tobacco Use  . Smoking status: Former Smoker    Last attempt to quit: 12/22/2008    Years since quitting: 10.3  . Smokeless tobacco: Never Used  Substance Use Topics  . Alcohol use: No    Alcohol/week: 0.0 standard drinks  . Drug use: Yes    Frequency: 7.0 times per week    Types: Marijuana    Comment: took this am, history of crack cocaine abuse     Allergies   Antihistamines, chlorpheniramine-type; Hydrocodone; and Sulfa antibiotics   Review of Systems Review of Systems  Cardiovascular: Positive for chest pain.  All other systems reviewed and are negative.    Physical Exam Updated Vital Signs LMP 11/03/2012   Physical Exam Vitals signs and nursing note reviewed.  Constitutional:      Appearance: She is well-developed.  HENT:     Head: Normocephalic and atraumatic.     Comments: Mild left conjunctival injection Eyes:     Extraocular Movements: Extraocular movements intact.     Pupils: Pupils are equal, round, and reactive to light.  Cardiovascular:     Rate and Rhythm: Normal rate and regular rhythm.     Heart sounds: No murmur.  Pulmonary:     Effort: Pulmonary effort is normal. No respiratory distress.     Breath sounds: Normal breath sounds.  Abdominal:     Palpations: Abdomen is soft.     Tenderness: There is no abdominal tenderness. There is no guarding or rebound.  Musculoskeletal:        General: No swelling or tenderness.  Skin:    General: Skin is warm and dry.  Neurological:     Mental Status: She is alert and oriented to person, place, and time.      Comments: Five out of five strength in all four extremities with sensation to light touch intact in all four extremities. No asymmetry of facial movements. No pronator drift. Visual fields are grossly intact. No ataxia on finger to nose bilaterally. Normal gait.  Psychiatric:        Behavior: Behavior  normal.      ED Treatments / Results  Labs (all labs ordered are listed, but only abnormal results are displayed) Labs Reviewed  BASIC METABOLIC PANEL  CBC WITH DIFFERENTIAL/PLATELET  TROPONIN I    EKG None  Radiology No results found.  Procedures Procedures (including critical care time)  Medications Ordered in ED Medications - No data to display   Initial Impression / Assessment and Plan / ED Course  I have reviewed the triage vital signs and the nursing notes.  Pertinent labs & imaging results that were available during my care of the patient were reviewed by me and considered in my medical decision making (see chart for details).  Clinical Course as of Apr 13 1430  Thu Apr 14, 2019  1403 Delta troponin is stable. Do not suspect ACS.   Troponin I(!!): 0.03 [AM]  1418 Pt states she could not wait. Will call pt with results.    [AM]    Clinical Course User Index [AM] Albesa Seen, PA-C       Pt here for evaluation of left sided chest pain, HA that occurred yesterday transiently.  EKG without acute ischemic changes.  Troponin is minimally elevated but similar when compared to priors.  Current presentation is not c/w ACS, dissection, SAH.  Given hx/o carotid stenosis, lateral neck pain, HA plan to obtain CTA to eval for changes.  Pt care transferred pending imaging.    Final Clinical Impressions(s) / ED Diagnoses   Final diagnoses:  Left-sided chest pain  Neck pain    ED Discharge Orders    None       Quintella Reichert, MD 04/14/19 1433

## 2019-04-14 NOTE — Discharge Instructions (Signed)
Please read and follow all provided instructions.  Your diagnoses today include:  1. Left-sided chest pain   2. Neck pain     Tests performed today include: An EKG of your heart A chest x-ray Cardiac enzymes - a blood test for heart muscle damage Blood counts and electrolytes Vital signs. See below for your results today.   Medications prescribed:   Take any prescribed medications only as directed.  Follow-up instructions: Please follow-up with your primary care provider as soon as you can for further evaluation of your symptoms.   Return instructions:  SEEK IMMEDIATE MEDICAL ATTENTION IF: You have severe chest pain, especially if the pain is crushing or pressure-like and spreads to the arms, back, neck, or jaw, or if you have sweating, nausea (feeling sick to your stomach), or shortness of breath. THIS IS AN EMERGENCY. Don't wait to see if the pain will go away. Get medical help at once. Call 911 or 0 (operator). DO NOT drive yourself to the hospital.  Your chest pain gets worse and does not go away with rest.  You have an attack of chest pain lasting longer than usual, despite rest and treatment with the medications your caregiver has prescribed.  You wake from sleep with chest pain or shortness of breath. You feel dizzy or faint. You have chest pain not typical of your usual pain for which you originally saw your caregiver.  You have any other emergent concerns regarding your health.  Additional Information: Chest pain comes from many different causes. Your caregiver has diagnosed you as having chest pain that is not specific for one problem, but does not require admission.  You are at low risk for an acute heart condition or other serious illness.   Your vital signs today were: BP 132/77    Pulse 73    Temp 99 F (37.2 C) (Oral)    Resp (!) 23    LMP 11/03/2012    SpO2 97%  If your blood pressure (BP) was elevated above 135/85 this visit, please have this repeated by your  doctor within one month. --------------

## 2019-04-14 NOTE — ED Triage Notes (Signed)
Pt reports she had an episode of CP and neck pain yesterday. She states the pain radiated up her left arm and she tried to massage it out and it radiated into her shoulder and head. Pt reports sx have resolved at present. No distress noted.

## 2019-04-29 ENCOUNTER — Ambulatory Visit (HOSPITAL_COMMUNITY)
Admission: EM | Admit: 2019-04-29 | Discharge: 2019-04-29 | Disposition: A | Discharge status: Home / Self Care | Payer: BLUE CROSS/BLUE SHIELD | Source: Home / Self Care

## 2019-04-29 ENCOUNTER — Encounter (HOSPITAL_COMMUNITY): Payer: Self-pay

## 2019-04-29 ENCOUNTER — Emergency Department (HOSPITAL_COMMUNITY)
Admission: EM | Admit: 2019-04-29 | Discharge: 2019-04-29 | Disposition: A | Discharge status: Home / Self Care | Payer: BLUE CROSS/BLUE SHIELD | Attending: Emergency Medicine | Admitting: Emergency Medicine

## 2019-04-29 ENCOUNTER — Other Ambulatory Visit: Payer: Self-pay

## 2019-04-29 DIAGNOSIS — Z79899 Other long term (current) drug therapy: Secondary | ICD-10-CM | POA: Diagnosis not present

## 2019-04-29 DIAGNOSIS — Z23 Encounter for immunization: Secondary | ICD-10-CM | POA: Diagnosis not present

## 2019-04-29 DIAGNOSIS — Z87891 Personal history of nicotine dependence: Secondary | ICD-10-CM | POA: Insufficient documentation

## 2019-04-29 DIAGNOSIS — Z7982 Long term (current) use of aspirin: Secondary | ICD-10-CM | POA: Insufficient documentation

## 2019-04-29 DIAGNOSIS — Y999 Unspecified external cause status: Secondary | ICD-10-CM | POA: Diagnosis not present

## 2019-04-29 DIAGNOSIS — W540XXA Bitten by dog, initial encounter: Secondary | ICD-10-CM | POA: Diagnosis not present

## 2019-04-29 DIAGNOSIS — S71152A Open bite, left thigh, initial encounter: Secondary | ICD-10-CM | POA: Diagnosis not present

## 2019-04-29 DIAGNOSIS — Y9289 Other specified places as the place of occurrence of the external cause: Secondary | ICD-10-CM | POA: Insufficient documentation

## 2019-04-29 DIAGNOSIS — S71151A Open bite, right thigh, initial encounter: Secondary | ICD-10-CM | POA: Insufficient documentation

## 2019-04-29 DIAGNOSIS — Y939 Activity, unspecified: Secondary | ICD-10-CM | POA: Insufficient documentation

## 2019-04-29 DIAGNOSIS — I10 Essential (primary) hypertension: Secondary | ICD-10-CM | POA: Insufficient documentation

## 2019-04-29 MED ORDER — AMOXICILLIN-POT CLAVULANATE 875-125 MG PO TABS
1.0000 | ORAL_TABLET | Freq: Once | ORAL | Status: AC
  Administered 2019-04-29: 14:00:00 1 via ORAL
  Filled 2019-04-29: qty 1

## 2019-04-29 MED ORDER — TETANUS-DIPHTH-ACELL PERTUSSIS 5-2.5-18.5 LF-MCG/0.5 IM SUSP
0.5000 mL | Freq: Once | INTRAMUSCULAR | Status: AC
  Administered 2019-04-29: 0.5 mL via INTRAMUSCULAR
  Filled 2019-04-29: qty 0.5

## 2019-04-29 MED ORDER — AMOXICILLIN-POT CLAVULANATE 875-125 MG PO TABS: 1.0000 | ORAL_TABLET | Freq: Two times a day (BID) | ORAL | 0 refills | Status: DC

## 2019-04-29 MED ORDER — BACITRACIN ZINC 500 UNIT/GM EX OINT
TOPICAL_OINTMENT | Freq: Two times a day (BID) | CUTANEOUS | Status: DC
  Administered 2019-04-29: 14:00:00 via TOPICAL

## 2019-04-29 NOTE — ED Notes (Signed)
Went to ED per provider

## 2019-04-29 NOTE — ED Notes (Signed)
Patient verbalized understanding of discharge instructions and denies any further needs or questions at this time. VS stable. Patient ambulatory with steady gait.  

## 2019-04-29 NOTE — ED Triage Notes (Signed)
Pt reports she was bitten by a dog approximately 1hr ago. Pt reports dog is up to date on shots.

## 2019-04-29 NOTE — Discharge Instructions (Addendum)
Please read attached information regarding wound care. It is important for you to use antibiotic ointment and complete the course of antibiotics to prevent infection in your wounds. Return to the ED if you start to have worsening symptoms, signs of infection including redness, swelling, pus draining from the area, red streaks or fever.

## 2019-04-29 NOTE — ED Provider Notes (Signed)
[facility] EMERGENCY DEPARTMENT Provider Note   CSN: [id number] Arrival date & time: 04/29/19  1322    History   Chief Complaint Chief Complaint  Patient presents with  . Animal Bite    HPI Tasha [name] is a 63 y.o. female who presents to ED for dog bite that occurred just prior to arrival.  She was bit by her niece's dog on the back of both of her thighs.  She states that the dog is up-to-date on its vaccinations.  Patient herself is not up-to-date on her tetanus.  States that she clean the area out with alcohol and soap and water at home.  She denies any other injuries, fevers or purulent drainage.     HPI  Past Medical History:  Diagnosis Date  . DDD (degenerative disc disease)    DJD.  spine.  chronic pain.   . Fibroids   . Hyperlipidemia   . Hypertension   . Hypokalemia 11/26/2017  . Pyelonephritis 2004  . Vertigo 2014   positional.  Eval with Dr Jannifer Franklin, neuro, 2014.     Patient Active Problem List   Diagnosis Date Noted  . Dyspnea on exertion 06/08/2018  . Acute pain of right shoulder 08/20/2016  . Left-sided carotid artery disease ([place]) 06/17/2016  . Essential hypertension 06/17/2016  . Hyperlipidemia 06/17/2016  . Chest pain 06/17/2016  . Palpitations 06/17/2016  . 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 Hypokalemia 07/26/2014  . Constipation 07/26/2014  . BRBPR (bright red blood per rectum) 07/26/2014  . Hematochezia 07/26/2014  . Benign paroxysmal positional vertigo 05/12/2014  . Dizziness and giddiness 09/27/2013  . Uterine leiomyoma 03/03/2013  . Postmenopausal bleeding 03/03/2013  . Pelvic pain 03/03/2013  . 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 Low back pain 10/23/2011    Past Surgical History:  Procedure Laterality Date  . CESAREAN SECTION    . CHOLECYSTECTOMY  2001  . ERCP W/ SPHICTEROTOMY  2001   removal CBD stones  . FOOT SURGERY Right 2017  . NECK  SURGERY     cadaver bones put in her neck  . SKIN BIOPSY  12/2009, 07/1102   1594 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      OB History    Gravida  4   Para  1   Term  1   Preterm      AB  3   Living  1     SAB  2   TAB      Ectopic      Multiple      Live Births  1            Home Medications    Prior to Admission medications   Medication Sig Start Date End Date Taking? Authorizing Provider  acetaminophen (TYLENOL) 650 MG CR tablet Take 1,300 mg by mouth every 8 (eight) hours as needed for pain.    [provider]  amoxicillin-clavulanate (AUGMENTIN) 875-125 MG tablet Take 1 tablet by mouth every 12 (twelve) hours. 04/29/19   Delia Heady, PA-C  aspirin EC 81 MG tablet Take 1 tablet (81 mg total) by mouth daily. 06/01/17   Ledell Noss, MD  EDARBI 80 MG TABS Take 80 mg by mouth daily.  06/11/18   [provider]  ezetimibe (ZETIA) 10 MG tablet Take 1 tablet (10 mg total) by mouth daily. 01/20/19 04/20/19  Lorretta Harp, MD  furosemide (LASIX)  20 MG tablet Take 1 tablet by mouth daily. 03/03/18   [provider]  ibuprofen (ADVIL,MOTRIN) 800 MG tablet Take 800 mg by mouth every 8 (eight) hours as needed for mild pain.  06/06/18   [provider]  LIVALO 4 MG TABS TAKE 1 TABLET BY MOUTH EVERY DAY Patient taking differently: Take 4 mg by mouth daily.  08/16/18   Lorretta Harp, MD  potassium chloride SA (K-DUR,KLOR-CON) 20 MEQ tablet Take 1 tablet (20 mEq total) by mouth daily. 11/27/17   Regalado, Belkys A, MD  traMADol (ULTRAM) 50 MG tablet Take 50-100 mg by mouth every 12 (twelve) hours as needed for moderate pain.  06/06/18   [provider]    Family History Family History  Problem Relation Age of Onset  . Hypertension Mother   . Cancer Mother   . Hyperlipidemia Mother   . Pancreatic cancer Mother   . Heart disease Father   . Cancer Father   . Heart attack Father   . Rectal cancer  Neg Hx   . Stomach cancer Neg Hx   . Colon cancer Neg Hx     Social History Social History   Tobacco Use  . Smoking status: Former Smoker    Last attempt to quit: 12/22/2008    Years since quitting: 10.3  . Smokeless tobacco: Never Used  Substance Use Topics  . Alcohol use: No    Alcohol/week: 0.0 standard drinks  . Drug use: Yes    Frequency: 7.0 times per week    Types: Marijuana    Comment: took this am, history of crack cocaine abuse     Allergies   Antihistamines, chlorpheniramine-type; Hydrocodone; and Sulfa antibiotics   Review of Systems Review of Systems  Constitutional: Negative for fever.  Musculoskeletal: Negative for myalgias.  Skin: Positive for wound.  Neurological: Negative for weakness and numbness.     Physical Exam Updated Vital Signs BP (!) 146/96 (BP Location: Right Arm)   Pulse 69   Temp 97.8 F (36.6 C) (Oral)   Resp 18   LMP 11/03/2012   SpO2 100%   Physical Exam Vitals signs and nursing note reviewed.  Constitutional:      General: She is not in acute distress.    Appearance: She is well-developed. She is not diaphoretic.  HENT:     Head: Normocephalic and atraumatic.  Eyes:     General: No scleral icterus.    Conjunctiva/sclera: Conjunctivae normal.  Neck:     Musculoskeletal: Normal range of motion.  Pulmonary:     Effort: Pulmonary effort is normal. No respiratory distress.  Skin:    Findings: Wound present. No rash.     Comments: 4 small puncture wounds noted to right thigh.  One similar, small puncture wound noted to left thigh.  No active bleeding or drainage noted.  Neurological:     Mental Status: She is alert.      ED Treatments / Results  Labs (all labs ordered are listed, but only abnormal results are displayed) Labs Reviewed - No data to display  EKG None  Radiology No results found.  Procedures Procedures (including critical care time)  Medications Ordered in ED Medications  bacitracin ointment (  Topical Given 04/29/19 1413)  amoxicillin-clavulanate (AUGMENTIN) 875-125 MG per tablet 1 tablet (1 tablet Oral Given 04/29/19 1412)  Tdap (BOOSTRIX) injection 0.5 mL (0.5 mLs Intramuscular Given 04/29/19 1413)     Initial Impression / Assessment and Plan / ED Course  I  have reviewed the triage vital signs and the nursing notes.  Pertinent labs & imaging results that were available during my care of the patient were reviewed by me and considered in my medical decision making (see chart for details).        63 year old female presents to ED for dog bite on bilateral thighs that occurred just prior to arrival.  She was bit by her niece's dog.  States that the dog is up-to-date on vaccines.  Puncture wounds noted on exam with no active bleeding.  Area was cleaned here extensively, patient given first dose of antibiotics and Tdap.  Will give remainder of antibiotic course, bacitracin and wound care instructions.  We will have her follow-up with PCP and return for any worsening symptoms.  Patient is hemodynamically stable, in NAD, and able to ambulate in the ED. Evaluation does not show pathology that would require ongoing emergent intervention or inpatient treatment. I explained the diagnosis to the patient. Pain has been managed and has no complaints prior to discharge. Patient is comfortable with above plan and is stable for discharge at this time. All questions were answered prior to disposition. Strict return precautions for returning to the ED were discussed. Encouraged follow up with PCP.   An After Visit Summary was printed and given to the patient.   Portions of this note were generated with [employer]. Dictation errors may occur despite best attempts at proofreading.  Final Clinical Impressions(s) / ED Diagnoses   Final diagnoses:  Dog bite of left thigh, initial encounter  Dog bite of right thigh, initial encounter    ED Discharge Orders         Ordered     amoxicillin-clavulanate (AUGMENTIN) 875-125 MG tablet  Every 12 hours     04/29/19 [place], [place], PA-C 04/29/19 1418    Lennice Sites, DO 04/29/19 1606

## 2019-05-03 LAB — HM MAMMOGRAPHY: HM Mammogram: NORMAL (ref 0–4)

## 2019-05-05 ENCOUNTER — Encounter: Payer: Self-pay | Admitting: Family

## 2019-05-05 ENCOUNTER — Encounter: Payer: Self-pay | Admitting: Nurse Practitioner

## 2019-05-10 ENCOUNTER — Telehealth: Payer: Self-pay | Admitting: *Deleted

## 2019-05-10 NOTE — Telephone Encounter (Signed)
I spoke with Tasha [name], her Insurance is now with [facility] is out of net work. She is seeing a doctor in [place].

## 2019-06-29 IMAGING — CR DG CERVICAL SPINE COMPLETE 4+V
5 series · 5 of 5 positions shown · non-contrast
Comparison: Brain and cervical spine CT 12/17/2018

CLINICAL DATA: Right-sided headache radiating to the right side of
the neck.

EXAM:
CERVICAL SPINE - COMPLETE 4+ VIEW

[c-spine lat]
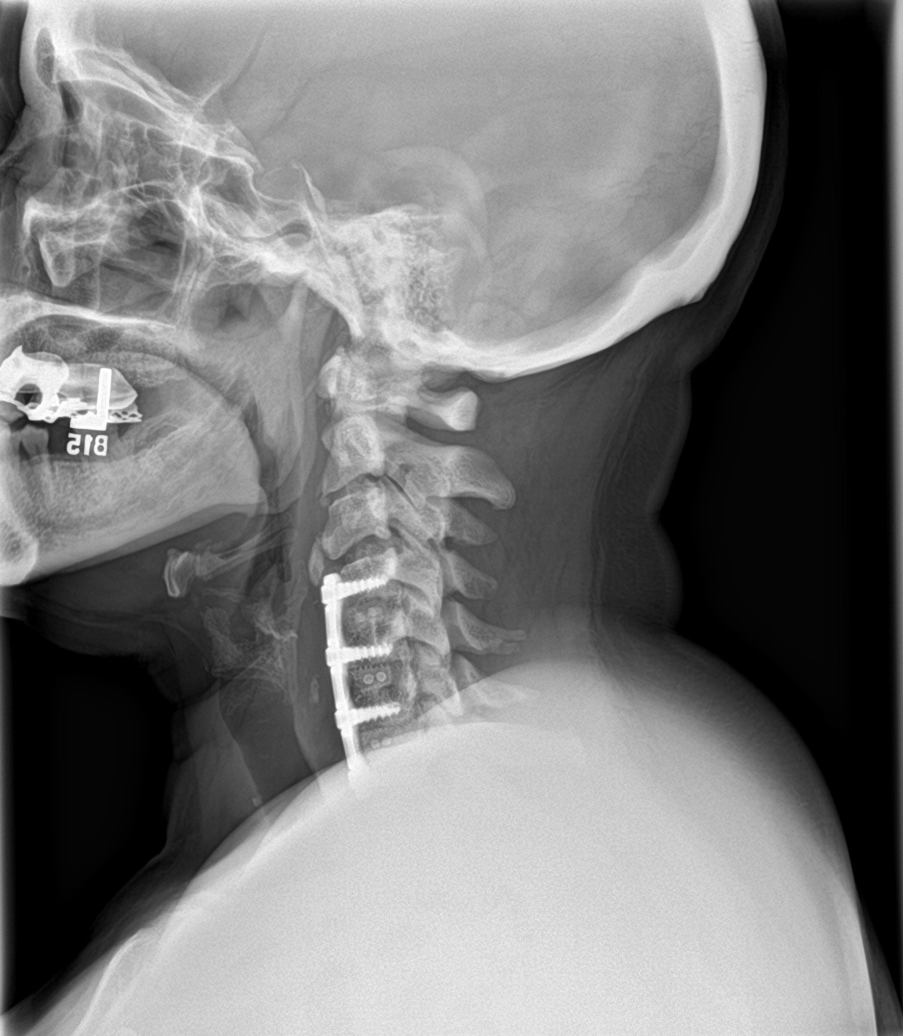

[c-spine obl (1 of 2)]
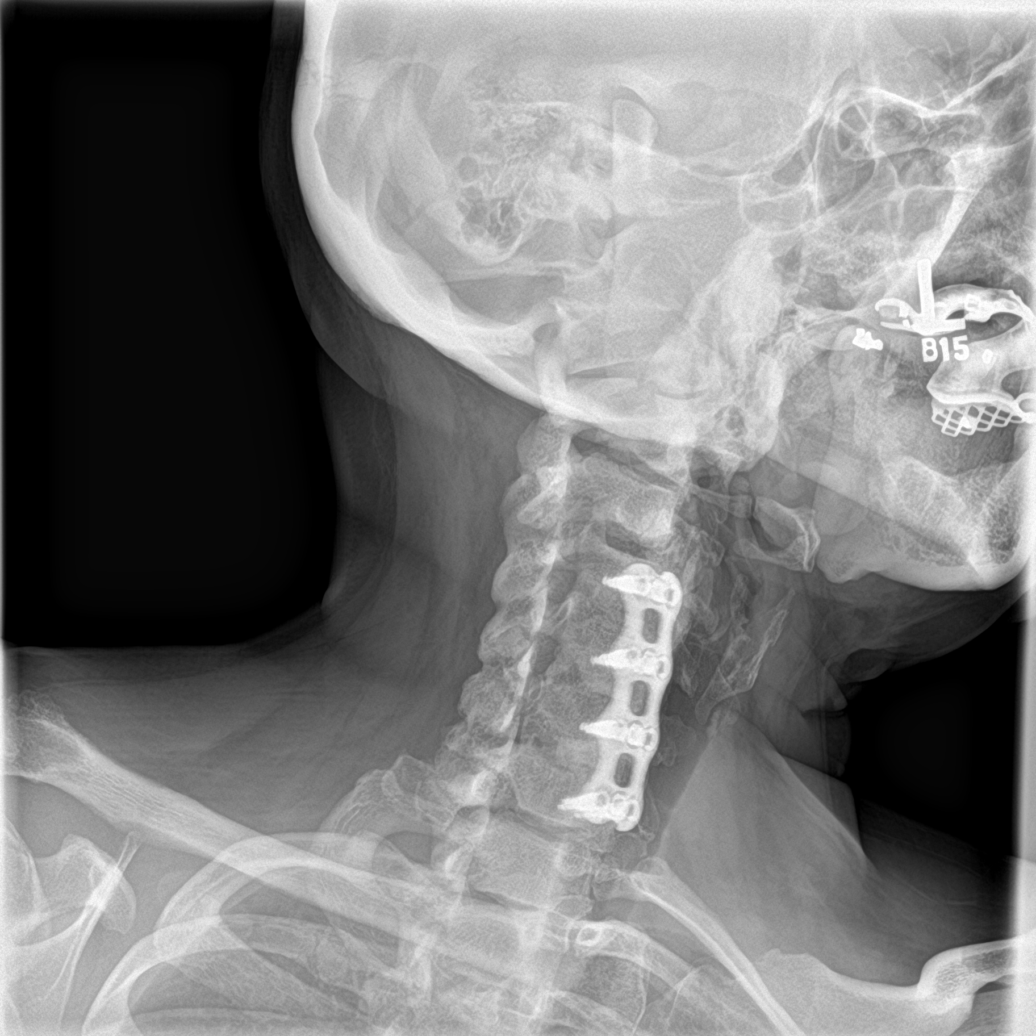

[c-spine obl (2 of 2)]
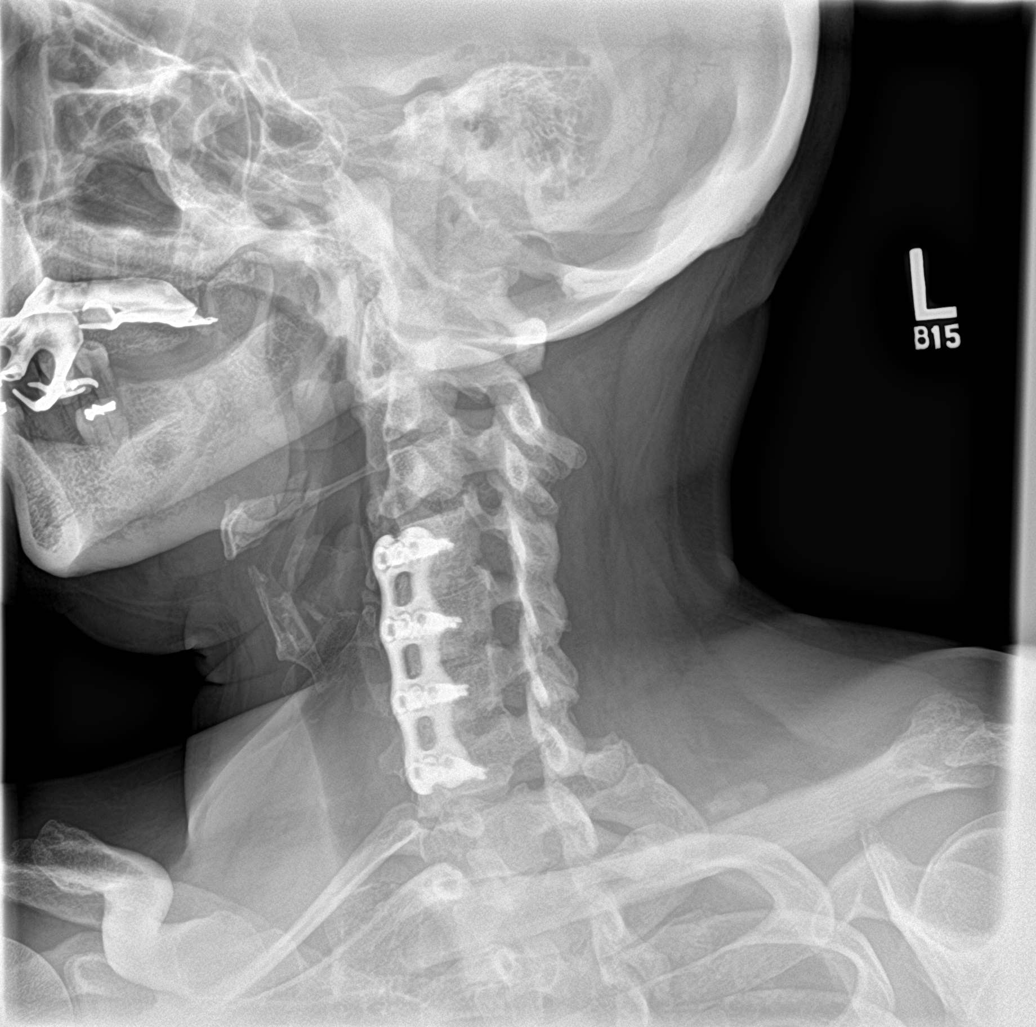

[c-spine ap]
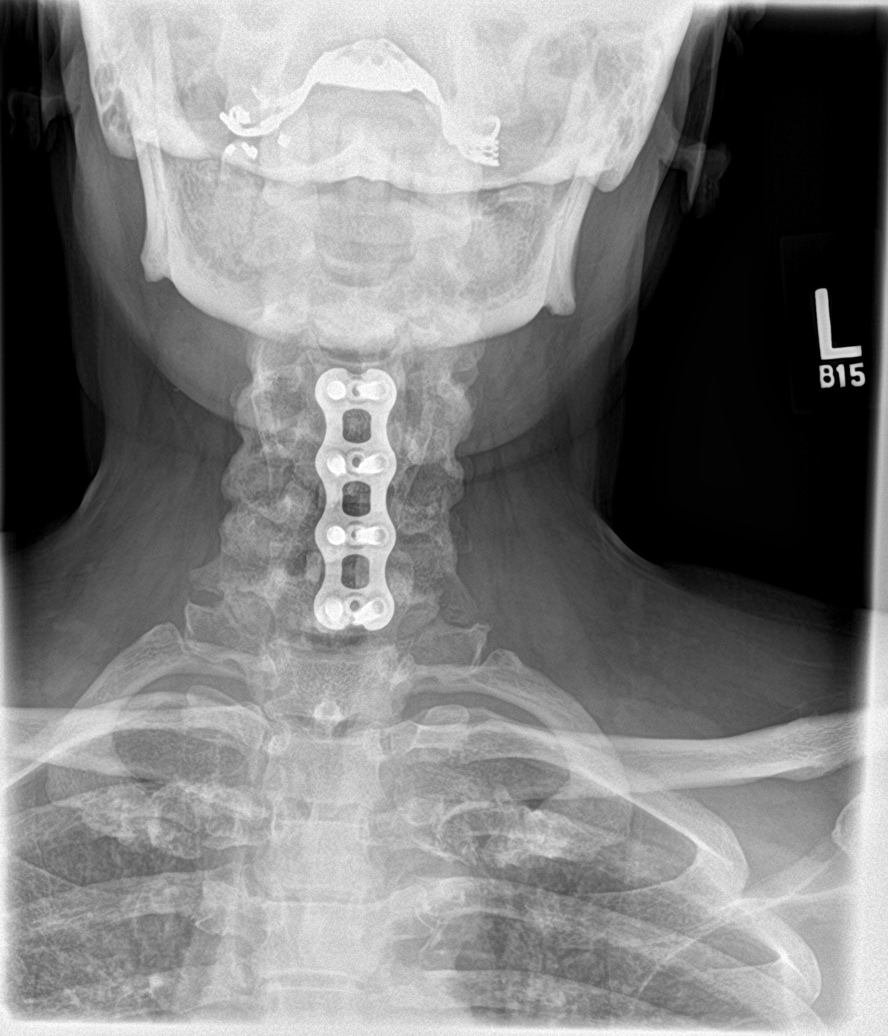

[c-spine open mouth]
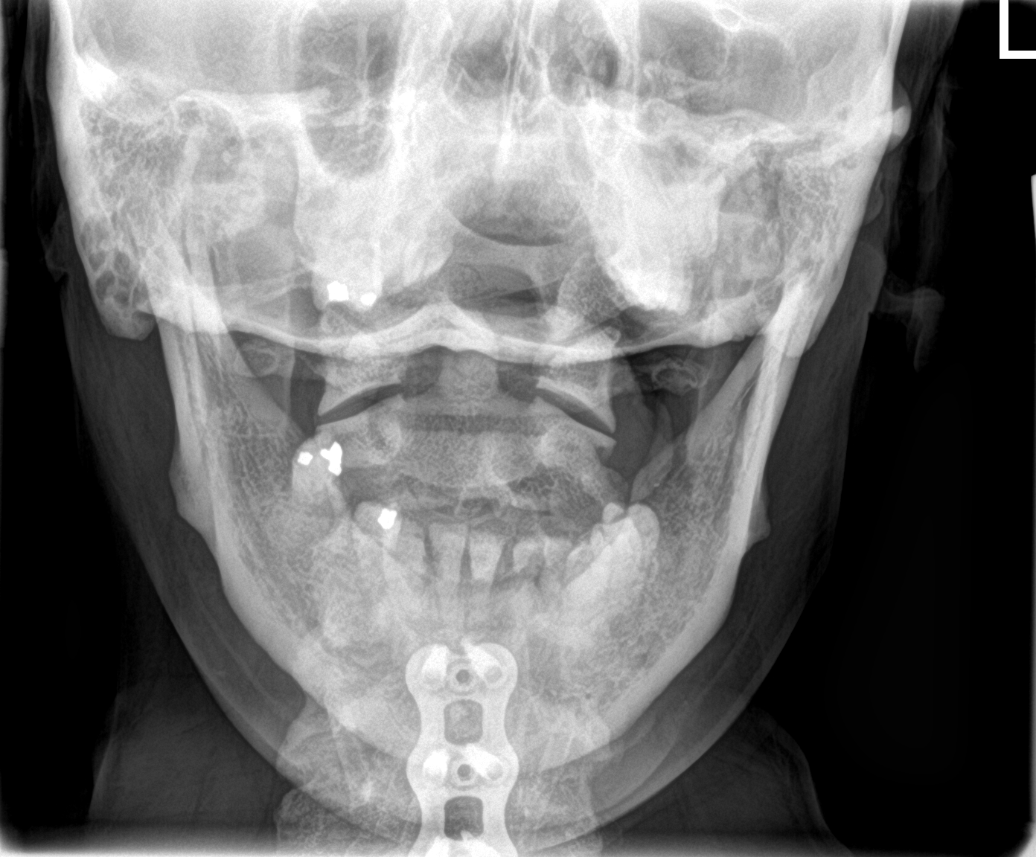

[5 of 5 positions shown; findings below may reference images not displayed]

FINDINGS: Visualization through the superior C7 endplate on lateral view.
C4-C7 anterior cervical spinal fusion hardware is demonstrated. No
evidence for acute fracture. Lateral masses articulate with the
dens. Lung apices are clear.
IMPRESSION: No acute osseous abnormality.

Anterior cervical spinal fusion C4-C7.

## 2019-10-12 ENCOUNTER — Other Ambulatory Visit: Payer: Self-pay | Admitting: Radiation Oncology

## 2019-10-12 DIAGNOSIS — D356 Benign neoplasm of aortic body and other paraganglia: Secondary | ICD-10-CM

## 2019-10-17 ENCOUNTER — Telehealth: Payer: Self-pay | Admitting: *Deleted

## 2019-10-17 NOTE — Telephone Encounter (Signed)
CALLED PATIENT TO ALTER FU ON  10-25-19 DUE TO DR. SQUIRE BEING OFF, RESCHEDULED FOR 11-02-19 @ 11:40 AM, LVM FOR A RETURN CALL

## 2019-10-24 ENCOUNTER — Telehealth: Payer: Self-pay | Admitting: *Deleted

## 2019-10-24 NOTE — Telephone Encounter (Addendum)
Oncology Nurse Navigator Documentation  Called [name].  She stated she had CT Neck conducted at [facility] 10/29, noted she was old by Dr. Servando Salina office she did not need to return to his clinic for 2 years.  She noted her new insurance does not cover [facility] visits any longer.  Dr. Isidore Moos updated, imaging to be requested from [facility].  [name], RN, BSN Head & Neck Oncology Nurse [place] at [place] [phone]

## 2019-10-25 ENCOUNTER — Other Ambulatory Visit: Payer: Self-pay | Admitting: *Deleted

## 2019-10-25 ENCOUNTER — Ambulatory Visit
Admission: RE | Admit: 2019-10-25 | Discharge: 2019-10-25 | Disposition: A | Discharge status: Home / Self Care | Payer: Self-pay | Source: Ambulatory Visit | Attending: Radiation Oncology | Admitting: Radiation Oncology

## 2019-10-25 ENCOUNTER — Telehealth: Payer: Self-pay | Admitting: *Deleted

## 2019-10-25 ENCOUNTER — Ambulatory Visit: Payer: BLUE CROSS/BLUE SHIELD | Admitting: Radiation Oncology

## 2019-10-25 DIAGNOSIS — D446 Neoplasm of uncertain behavior of carotid body: Secondary | ICD-10-CM

## 2019-10-25 NOTE — Telephone Encounter (Signed)
Oncology Nurse Navigator Documentation  Fax sent to [facility] requesting the following imaging be pushed to [employer]:  10/20/2019 CT Neck/Thyroid w/ Contrast Notification of successful fax transmission received.  Order placed for assignment to PACS Timeline.  [name], RN, BSN Head & Neck Oncology Nurse [place] [facility] at [place] [phone]

## 2019-11-02 ENCOUNTER — Ambulatory Visit: Payer: BLUE CROSS/BLUE SHIELD | Admitting: Radiation Oncology

## 2020-03-02 ENCOUNTER — Ambulatory Visit: Payer: BLUE CROSS/BLUE SHIELD

## 2020-03-12 ENCOUNTER — Ambulatory Visit: Payer: BLUE CROSS/BLUE SHIELD | Attending: Internal Medicine

## 2020-03-12 DIAGNOSIS — Z23 Encounter for immunization: Secondary | ICD-10-CM

## 2020-03-12 NOTE — Progress Notes (Signed)
   Covid-19 Vaccination Clinic  Name:  Tasha [name]    MRN: [id number] DOB: February 11, 1956  03/12/2020  [name] was observed post Covid-19 immunization for 15 minutes without incident. She was provided with Vaccine Information Sheet and instruction to access the V-Safe system.   [name] was instructed to call 911 with any severe reactions post vaccine: Marland Kitchen Difficulty breathing  . Swelling of face and throat  . A fast heartbeat  . A bad rash all over body  . Dizziness and weakness   Immunizations Administered    Name Date Dose VIS Date Route   Pfizer COVID-19 Vaccine 03/12/2020 11:41 AM 0.3 mL 12/02/2019 Intramuscular   Manufacturer: [place]   Lot: [id number]   [place]: [id number]

## 2020-04-04 ENCOUNTER — Ambulatory Visit: Payer: BLUE CROSS/BLUE SHIELD | Attending: Internal Medicine

## 2020-04-04 DIAGNOSIS — Z23 Encounter for immunization: Secondary | ICD-10-CM

## 2020-04-04 NOTE — Progress Notes (Signed)
   Covid-19 Vaccination Clinic  Name:  [name]    MRN: [id number] DOB: [birth year]  04/04/2020  [name] was observed post Covid-19 immunization for 15 minutes without incident. She was provided with Vaccine Information Sheet and instruction to access the V-Safe system.   [name] was instructed to call 911 with any severe reactions post vaccine: Marland Kitchen Difficulty breathing  . Swelling of face and throat  . A fast heartbeat  . A bad rash all over body  . Dizziness and weakness   Immunizations Administered    Name Date Dose VIS Date Route   Pfizer COVID-19 Vaccine 04/04/2020  3:00 PM 0.3 mL 12/02/2019 Intramuscular   Manufacturer: [place]   Lot: [id number]   [place]: [id number]

## 2020-12-13 ENCOUNTER — Ambulatory Visit: Payer: BLUE CROSS/BLUE SHIELD | Attending: Critical Care Medicine

## 2020-12-13 DIAGNOSIS — Z23 Encounter for immunization: Secondary | ICD-10-CM

## 2020-12-13 NOTE — Progress Notes (Signed)
   Covid-19 Vaccination Clinic  Name:  Tasha [name]    MRN: [id number] DOB: 11-Aug-1956  12/13/2020  [name] was observed post Covid-19 immunization for 15 minutes without incident. She was provided with Vaccine Information Sheet and instruction to access the V-Safe system.   [name] was instructed to call 911 with any severe reactions post vaccine: Marland Kitchen Difficulty breathing  . Swelling of face and throat  . A fast heartbeat  . A bad rash all over body  . Dizziness and weakness   Immunizations Administered    Name Date Dose VIS Date Route   Pfizer COVID-19 Vaccine 12/13/2020  3:18 PM 0.3 mL 10/10/2020 Intramuscular   Manufacturer: [place]   Lot: [id number]   [place]: [address]-5

## 2021-03-17 ENCOUNTER — Emergency Department (HOSPITAL_COMMUNITY): Payer: Medicare HMO

## 2021-03-17 ENCOUNTER — Emergency Department (HOSPITAL_COMMUNITY)
Admission: EM | Admit: 2021-03-17 | Discharge: 2021-03-17 | Disposition: A | Discharge status: Home / Self Care | Payer: Medicare HMO | Attending: Emergency Medicine | Admitting: Emergency Medicine

## 2021-03-17 ENCOUNTER — Other Ambulatory Visit: Payer: Self-pay

## 2021-03-17 DIAGNOSIS — M25552 Pain in left hip: Secondary | ICD-10-CM | POA: Diagnosis not present

## 2021-03-17 DIAGNOSIS — Z79899 Other long term (current) drug therapy: Secondary | ICD-10-CM | POA: Insufficient documentation

## 2021-03-17 DIAGNOSIS — R1084 Generalized abdominal pain: Secondary | ICD-10-CM | POA: Insufficient documentation

## 2021-03-17 DIAGNOSIS — Z7982 Long term (current) use of aspirin: Secondary | ICD-10-CM | POA: Insufficient documentation

## 2021-03-17 DIAGNOSIS — M542 Cervicalgia: Secondary | ICD-10-CM | POA: Insufficient documentation

## 2021-03-17 DIAGNOSIS — I10 Essential (primary) hypertension: Secondary | ICD-10-CM | POA: Diagnosis not present

## 2021-03-17 DIAGNOSIS — T148XXA Other injury of unspecified body region, initial encounter: Secondary | ICD-10-CM | POA: Diagnosis not present

## 2021-03-17 DIAGNOSIS — M25512 Pain in left shoulder: Secondary | ICD-10-CM | POA: Diagnosis not present

## 2021-03-17 DIAGNOSIS — T07XXXA Unspecified multiple injuries, initial encounter: Secondary | ICD-10-CM

## 2021-03-17 DIAGNOSIS — Y92410 Unspecified street and highway as the place of occurrence of the external cause: Secondary | ICD-10-CM | POA: Insufficient documentation

## 2021-03-17 DIAGNOSIS — Z87891 Personal history of nicotine dependence: Secondary | ICD-10-CM | POA: Insufficient documentation

## 2021-03-17 DIAGNOSIS — R519 Headache, unspecified: Secondary | ICD-10-CM | POA: Diagnosis not present

## 2021-03-17 LAB — COMPREHENSIVE METABOLIC PANEL
ALT: 20 U/L (ref 0–44)
AST: 23 U/L (ref 15–41)
Albumin: 3.8 g/dL (ref 3.5–5.0)
Alkaline Phosphatase: 69 U/L (ref 38–126)
Anion gap: 5 (ref 5–15)
BUN: 15 mg/dL (ref 8–23)
CO2: 22 mmol/L (ref 22–32)
Calcium: 9.1 mg/dL (ref 8.9–10.3)
Chloride: 112 mmol/L — ABNORMAL HIGH (ref 98–111)
Creatinine, Ser: 0.82 mg/dL (ref 0.44–1.00)
GFR, Estimated: >60 mL/min (ref >60)
Glucose, Bld: 127 mg/dL — ABNORMAL HIGH (ref 70–99)
Potassium: 4 mmol/L (ref 3.5–5.1)
Sodium: 139 mmol/L (ref 135–145)
Total Bilirubin: 1.2 mg/dL (ref 0.3–1.2)
Total Protein: 7.2 g/dL (ref 6.5–8.1)

## 2021-03-17 LAB — CBC WITH DIFFERENTIAL/PLATELET
Abs Immature Granulocytes: 0 10*3/uL (ref 0.00–0.07)
Basophils Absolute: 0 10*3/uL (ref 0.0–0.1)
Basophils Relative: 0 %
Eosinophils Absolute: 0.1 10*3/uL (ref 0.0–0.5)
Eosinophils Relative: 1 %
HCT: 35.6 % — ABNORMAL LOW (ref 36.0–46.0)
Hemoglobin: 11.6 g/dL — ABNORMAL LOW (ref 12.0–15.0)
Immature Granulocytes: 0 %
Lymphocytes Relative: 29 %
Lymphs Abs: 1.7 10*3/uL (ref 0.7–4.0)
MCH: 30.2 pg (ref 26.0–34.0)
MCHC: 32.6 g/dL (ref 30.0–36.0)
MCV: 92.7 fL (ref 80.0–100.0)
Monocytes Absolute: 0.4 10*3/uL (ref 0.1–1.0)
Monocytes Relative: 6 %
Neutro Abs: 3.7 10*3/uL (ref 1.7–7.7)
Neutrophils Relative %: 64 %
Platelets: 219 10*3/uL (ref 150–400)
RBC: 3.84 MIL/uL — ABNORMAL LOW (ref 3.87–5.11)
RDW: 13.9 % (ref 11.5–15.5)
WBC: 5.9 10*3/uL (ref 4.0–10.5)
nRBC: 0 % (ref 0.0–0.2)

## 2021-03-17 MED ORDER — TIZANIDINE HCL 4 MG PO TABS: 4.0000 mg | ORAL_TABLET | Freq: Four times a day (QID) | ORAL | 0 refills | Status: DC | PRN

## 2021-03-17 MED ORDER — DIPHENHYDRAMINE HCL 50 MG/ML IJ SOLN
25.0000 mg | Freq: Once | INTRAMUSCULAR | Status: AC
  Administered 2021-03-17: 25 mg via INTRAVENOUS
  Filled 2021-03-17: qty 1

## 2021-03-17 MED ORDER — NAPROXEN 375 MG PO TABS: 375.0000 mg | ORAL_TABLET | Freq: Two times a day (BID) | ORAL | 0 refills | Status: DC

## 2021-03-17 MED ORDER — MORPHINE SULFATE (PF) 4 MG/ML IV SOLN
4.0000 mg | Freq: Once | INTRAVENOUS | Status: AC
  Administered 2021-03-17: 4 mg via INTRAVENOUS
  Filled 2021-03-17: qty 1

## 2021-03-17 MED ORDER — NAPROXEN 375 MG PO TABS
375.0000 mg | ORAL_TABLET | Freq: Once | ORAL | Status: AC
  Administered 2021-03-17: 375 mg via ORAL
  Filled 2021-03-17: qty 1
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
## 2021-07-10 ENCOUNTER — Emergency Department (HOSPITAL_COMMUNITY)
Admission: EM | Admit: 2021-07-10 | Discharge: 2021-07-10 | Disposition: A | Discharge status: Home / Self Care | Payer: Medicare HMO | Attending: Emergency Medicine | Admitting: Emergency Medicine

## 2021-07-10 ENCOUNTER — Encounter (HOSPITAL_COMMUNITY): Payer: Self-pay | Admitting: Emergency Medicine

## 2021-07-10 ENCOUNTER — Emergency Department (HOSPITAL_COMMUNITY): Payer: Medicare HMO

## 2021-07-10 ENCOUNTER — Other Ambulatory Visit: Payer: Self-pay

## 2021-07-10 DIAGNOSIS — R0602 Shortness of breath: Secondary | ICD-10-CM | POA: Diagnosis not present

## 2021-07-10 DIAGNOSIS — R0789 Other chest pain: Secondary | ICD-10-CM | POA: Insufficient documentation

## 2021-07-10 DIAGNOSIS — Z5321 Procedure and treatment not carried out due to patient leaving prior to being seen by health care provider: Secondary | ICD-10-CM | POA: Diagnosis not present

## 2021-07-10 LAB — CBC WITH DIFFERENTIAL/PLATELET
Abs Immature Granulocytes: 0.04 10*3/uL (ref 0.00–0.07)
Basophils Absolute: 0 10*3/uL (ref 0.0–0.1)
Basophils Relative: 0 %
Eosinophils Absolute: 0 10*3/uL (ref 0.0–0.5)
Eosinophils Relative: 0 %
HCT: 38.3 % (ref 36.0–46.0)
Hemoglobin: 12.5 g/dL (ref 12.0–15.0)
Immature Granulocytes: 0 %
Lymphocytes Relative: 22 %
Lymphs Abs: 2.5 10*3/uL (ref 0.7–4.0)
MCH: 30.2 pg (ref 26.0–34.0)
MCHC: 32.6 g/dL (ref 30.0–36.0)
MCV: 92.5 fL (ref 80.0–100.0)
Monocytes Absolute: 0.6 10*3/uL (ref 0.1–1.0)
Monocytes Relative: 5 %
Neutro Abs: 8 10*3/uL — ABNORMAL HIGH (ref 1.7–7.7)
Neutrophils Relative %: 73 %
Platelets: 258 10*3/uL (ref 150–400)
RBC: 4.14 MIL/uL (ref 3.87–5.11)
RDW: 15.1 % (ref 11.5–15.5)
WBC: 11.1 10*3/uL — ABNORMAL HIGH (ref 4.0–10.5)
nRBC: 0 % (ref 0.0–0.2)

## 2021-07-10 LAB — TROPONIN I (HIGH SENSITIVITY): Troponin I (High Sensitivity): 5 ng/L (ref <18)

## 2021-07-10 LAB — COMPREHENSIVE METABOLIC PANEL
ALT: 39 U/L (ref 0–44)
AST: 17 U/L (ref 15–41)
Albumin: 3.4 g/dL — ABNORMAL LOW (ref 3.5–5.0)
Alkaline Phosphatase: 79 U/L (ref 38–126)
Anion gap: 7 (ref 5–15)
BUN: 23 mg/dL (ref 8–23)
CO2: 17 mmol/L — ABNORMAL LOW (ref 22–32)
Calcium: 9.3 mg/dL (ref 8.9–10.3)
Chloride: 114 mmol/L — ABNORMAL HIGH (ref 98–111)
Creatinine, Ser: 0.89 mg/dL (ref 0.44–1.00)
GFR, Estimated: >60 mL/min (ref >60)
Glucose, Bld: 125 mg/dL — ABNORMAL HIGH (ref 70–99)
Potassium: 4.1 mmol/L (ref 3.5–5.1)
Sodium: 138 mmol/L (ref 135–145)
Total Bilirubin: 0.6 mg/dL (ref 0.3–1.2)
Total Protein: 7 g/dL (ref 6.5–8.1)

## 2021-07-10 LAB — LIPASE, BLOOD: Lipase: 88 U/L — ABNORMAL HIGH (ref 11–51)

## 2021-07-10 NOTE — ED Notes (Signed)
Pt walking out of lobby

## 2021-07-10 NOTE — ED Triage Notes (Signed)
Patient coming from [facility]. Complaint of chest pain and shortness of breath x3 days. UCC worried about low potassium. VSS.

## 2021-07-10 NOTE — ED Provider Notes (Signed)
Emergency Medicine Provider Triage Evaluation Note  Telma Pyeatt , a 65 y.o. female  was evaluated in triage.  Pt complains of chest pressure and shortness of breath for last 3 days. Lower chest, constant.  Review of Systems  Positive: Chest pressure, shortness of breath Negative: N/V/D, other abd, syncope, cough  Physical Exam  BP 115/77 (BP Location: Right Arm)   Pulse 63   Temp 97.8 F (36.6 C) (Oral)   Resp 18   LMP 11/03/2012   SpO2 95%  Gen:   Awake, no distress   Resp:  Normal effort  MSK:   Moves extremities without difficulty  Other:    Medical Decision Making  Medically screening exam initiated at 3:53 PM.  Appropriate orders placed.  Jazyiah Yiu was informed that the remainder of the evaluation will be completed by another provider, this initial triage assessment does not replace that evaluation, and the importance of remaining in the ED until their evaluation is complete.     Lorayne Bender, PA-C 07/10/21 1555    Lajean Saver, MD 07/15/21 [phone]

## 2021-12-29 ENCOUNTER — Emergency Department (HOSPITAL_COMMUNITY)
Admission: EM | Admit: 2021-12-29 | Discharge: 2021-12-29 | Disposition: A | Discharge status: Home / Self Care | Payer: Medicare Other | Attending: Emergency Medicine | Admitting: Emergency Medicine

## 2021-12-29 ENCOUNTER — Emergency Department (HOSPITAL_COMMUNITY): Payer: Medicare Other

## 2021-12-29 ENCOUNTER — Encounter (HOSPITAL_COMMUNITY): Payer: Self-pay

## 2021-12-29 ENCOUNTER — Other Ambulatory Visit: Payer: Self-pay

## 2021-12-29 DIAGNOSIS — S32009A Unspecified fracture of unspecified lumbar vertebra, initial encounter for closed fracture: Secondary | ICD-10-CM

## 2021-12-29 DIAGNOSIS — W19XXXA Unspecified fall, initial encounter: Secondary | ICD-10-CM

## 2021-12-29 DIAGNOSIS — S32058A Other fracture of fifth lumbar vertebra, initial encounter for closed fracture: Secondary | ICD-10-CM

## 2021-12-29 DIAGNOSIS — M79604 Pain in right leg: Secondary | ICD-10-CM | POA: Diagnosis not present

## 2021-12-29 DIAGNOSIS — S3992XA Unspecified injury of lower back, initial encounter: Secondary | ICD-10-CM | POA: Diagnosis present

## 2021-12-29 HISTORY — DX: Essential (primary) hypertension: I10

## 2021-12-29 MED ORDER — CYCLOBENZAPRINE HCL 10 MG PO TABS: 5.0000 mg | ORAL_TABLET | Freq: Two times a day (BID) | ORAL | 0 refills | Status: DC | PRN

## 2021-12-29 MED ORDER — CYCLOBENZAPRINE HCL 10 MG PO TABS: 10.0000 mg | ORAL_TABLET | Freq: Two times a day (BID) | ORAL | 0 refills | Status: DC | PRN

## 2021-12-29 MED ORDER — PREDNISONE 20 MG PO TABS: 40.0000 mg | ORAL_TABLET | Freq: Every day | ORAL | 0 refills | Status: DC

## 2021-12-29 MED ORDER — OXYCODONE-ACETAMINOPHEN 5-325 MG PO TABS
1.0000 | ORAL_TABLET | Freq: Once | ORAL | Status: AC
  Administered 2021-12-29: 1 via ORAL
  Filled 2021-12-29: qty 1

## 2021-12-29 MED ORDER — OXYCODONE-ACETAMINOPHEN 5-325 MG PO TABS
1.0000 | ORAL_TABLET | Freq: Three times a day (TID) | ORAL | 0 refills | Status: DC | PRN
Start: 2021-12-29 — End: 2023-07-31

## 2021-12-29 MED ORDER — HYDROMORPHONE HCL 1 MG/ML IJ SOLN
1.0000 mg | Freq: Once | INTRAMUSCULAR | Status: AC
Start: 2021-12-29 — End: 2021-12-29
  Administered 2021-12-29: 1 mg via INTRAMUSCULAR
  Filled 2021-12-29: qty 1

## 2021-12-29 NOTE — Discharge Instructions (Addendum)
Use Tylenol or ibuprofen as needed for mild to moderate pain.  Use Percocet as needed for severe breakthrough pain.  Have caution, this make it out of body.  Not drive or operate heavy machinery while take this medicine. Use flexeril to help with pain control. Call your neurosurgeon today to set up a follow-up appointment for further evaluation of your back. Return to the emergency room with any new, worsening, concerning symptoms

## 2021-12-29 NOTE — ED Triage Notes (Addendum)
Pt arrives EMS from home after a fall yesterday around [place] lower back pain that radiates to right leg. Denies  head injury or LOC. Recent back surgery on 12/17/21.

## 2021-12-29 NOTE — ED Provider Notes (Signed)
[facility] Emergency Department Provider Note MRN:  [id number]  Arrival date & time: 12/29/21     Chief Complaint   Fall and Back Pain   History of Present Illness   Tasha [name] is a 66 y.o. year-old female presents to the ED with chief complaint of fall and low back pain and hip pain.  She states that the pain radiates down her right leg.  States that the fall happened yesterday while trying to navigate around the refrigerator door.  She fell landing on her right side.  She denies bowel or bladder incontinence.  She was able to ambulate after the fall.  She takes Vicodin for pain.  She reports recent lumbar spine surgery in [place].    Review of Systems  Pertinent review of systems noted in HPI.    Physical Exam   Vitals:   12/29/21 0427 12/29/21 0430  BP:  (!) 146/118  Pulse:  100  Resp:  12  Temp:  97.9 F (36.6 C)  SpO2: 98% 100%    CONSTITUTIONAL:  uncomfortable-appearing, NAD NEURO:  Alert and oriented x 3, CN 3-12 grossly intact EYES:  eyes equal and reactive ENT/NECK:  Supple, no stridor  CARDIO:  normal rate, regular rhythm, appears well-perfused  PULM:  No respiratory distress,  GI/GU:  non-distended,  MSK/SPINE:  No gross deformities, no edema, moves all extremities  SKIN:  no rash, atraumatic   *Additional and/or pertinent findings included in MDM below  Diagnostic and Interventional Summary    EKG Interpretation  Date/Time:    Ventricular Rate:    PR Interval:    QRS Duration:   QT Interval:    QTC Calculation:   R Axis:     Text Interpretation:         Labs Reviewed - No data to display  CT Lumbar Spine Wo Contrast    (Results Pending)  CT Hip Right Wo Contrast    (Results Pending)    Medications - No data to display   Procedures  /  Critical Care Procedures  ED Course and Medical Decision Making  I have reviewed the triage vital signs, the nursing notes, and pertinent available records from the  EMR.  Complexity of Problems Addressed Chronic illness with exacerbation     ED Course    Patient is here with a fall at home yesterday.  She reports recent back surgery in [place].  States that she is got pain in her low back and pain that runs down both legs.  She does not have any added presents of cauda equina.  She has normal sensation and strength of her lower extremities.  She has been ambulatory since the fall.  Will give 1mg  dilaudid IM for pain control.  Will check CT imaging of right hip and lumbar spine to rule out any fracture.  If these images are negative, I believe that patient can be safely discharged with outpatient follow-up.  Will treat patient with prescription flexeril and prednisone and have her follow-up with her neurosurgeon.  She is agreeable with plan and feels improved after receiving pain medication in the ED.     Final Clinical Impressions(s) / ED Diagnoses     ICD-10-CM   1. Fall, initial encounter  W19.XXXA     2. Closed fracture of transverse process of lumbar vertebra, initial encounter ([place])  S32.009A     3. Other closed fracture of fifth lumbar vertebra, initial encounter ([place])  J94.174Y  ED Discharge Orders          Ordered    cyclobenzaprine (FLEXERIL) 10 MG tablet  2 times daily PRN,   Status:  Discontinued        12/29/21 0623    predniSONE (DELTASONE) 20 MG tablet  Daily,   Status:  Discontinued        12/29/21 0623    oxyCODONE-acetaminophen (PERCOCET/ROXICET) 5-325 MG tablet  Every 8 hours PRN        12/29/21 0755    cyclobenzaprine (FLEXERIL) 10 MG tablet  2 times daily PRN        12/29/21 0757             Discharge Instructions Discussed with and Provided to Patient:     Discharge Instructions      Use Tylenol or ibuprofen as needed for mild to moderate pain.  Use Percocet as needed for severe breakthrough pain.  Have caution, this make it out of body.  Not drive or operate heavy machinery while take this  medicine. Use flexeril to help with pain control. Call your neurosurgeon today to set up a follow-up appointment for further evaluation of your back. Return to the emergency room with any new, worsening, concerning symptoms      Delaine Lame 12/29/21 2325    Quintella Reichert, MD 12/30/21 1046

## 2021-12-29 NOTE — ED Provider Notes (Signed)
Physical Exam  BP (!) 182/89    Pulse 88    Temp 97.9 F (36.6 C) (Oral)    Resp 15    Ht 5\' 5"  (1.651 m)    Wt 90.7 kg    SpO2 99%    BMI 33.28 kg/m   Physical Exam Vitals and nursing note reviewed.  Constitutional:      General: She is not in acute distress.    Appearance: She is well-developed.  HENT:     Head: Normocephalic and atraumatic.  Eyes:     Extraocular Movements: Extraocular movements intact.  Cardiovascular:     Rate and Rhythm: Normal rate.  Pulmonary:     Effort: Pulmonary effort is normal.  Abdominal:     General: There is no distension.  Musculoskeletal:        General: Tenderness present. Normal range of motion.     Cervical back: Normal range of motion.     Comments: Mild ttp of low back.   Skin:    General: Skin is warm.     Findings: No rash.  Neurological:     Mental Status: She is alert and oriented to person, place, and time.    Procedures  Procedures  ED Course / MDM    Medical Decision Making  Patient signed out to me by R. Browning, PA-C.  Please see previous notes for further history.  In brief, patient recently had a lumbar laminectomy at [facility] 2 weeks ago.  Last night she had a mechanical fall, where she landed on her hip.  She not hit her head or lose consciousness.  She is pending CTs of her lumbar spine and hip.   CT of the right hip negative for acute findings.  CT of the lumbar spine shows L4 transverse process fracture extending to the pedicle screw as well as suspicion for nondisplaced L5 body fractures along the course of both pedicle screws.  Patient is neurovascularly intact.  Will consult with neurosurgery at [facility].  Discussed with Dr. Violeta Gelinas from [facility] neurosurgery and reviewed results.  He recommends pain control and follow-up in the office.  Does not feel patient needs a TLSO unless she is unable to ambulate due to pain.  Discsussed findings and plan with pt who is agreeable.   Patient  able to ambulate with mild discomfort.  Discussed importance of close outpatient follow-up.  At this time, patient appears safe for discharge.  Return precautions given.  Patient states she understands and agrees to plan  No results found for this or any previous visit. CT Lumbar Spine Wo Contrast  Result Date: 12/29/2021 CLINICAL DATA:  66 year old female status post fall yesterday. Low back pain radiating to the right leg. Recent spine surgery last month. EXAM: CT LUMBAR SPINE WITHOUT CONTRAST TECHNIQUE: Multidetector CT imaging of the lumbar spine was performed without intravenous contrast administration. Multiplanar CT image reconstructions were also generated. COMPARISON:  No prior lumbar imaging. Hip CT the same day reported separately. CT Abdomen and Pelvis 07/12/2021. FINDINGS: Segmentation: Normal. Alignment: Lumbar lordosis has not significantly changed from last year. There is chronic grade 1 anterolisthesis of L4 on L5. Vertebrae: Postoperative details are below. Background bone mineralization within normal limits. No acute osseous abnormality identified. Visible sacrum and SI joints intact. Paraspinal and other soft tissues: Surgically absent gallbladder. Aortoiliac calcified atherosclerosis. Normal caliber abdominal aorta. Small calcified uterine fundal fibroid. Otherwise negative visible noncontrast abdominal viscera. Postoperative changes to the lower lumbar paraspinal soft  tissues with no adverse features identified. Disc levels: T11-T12: Mild disc space loss with vacuum disc. Circumferential disc bulge and endplate spurring but no definite stenosis. T12-L1:  Negative. L1-L2:  Negative. L2-L3: Mild circumferential disc bulge and endplate spurring. No significant stenosis. L3-L4: Mild circumferential disc bulge. Mild facet hypertrophy. No significant stenosis. L4-L5: Interval decompression and fusion. Bilateral L4 pedicle screws appear intact without loosening however, there is an associated  nondisplaced fracture of the right L4 transverse process extending to the course of the screw (series 8, image 34 and series 9, image 14). Interbody implant and interbody, posterior bone graft material. Minimal bone graft material in the left L4 neural foramen. Thecal sac detail limited by hardware artifact. No obvious stenosis. L5-S1: Interval decompression and fusion. Bilateral pedicle screws appear intact however, difficult to exclude nondisplaced left posterior body and right posterior junction of body and pedicle fractures. See series 8, images 31, 32, 35. These are not clearly correlated in the other planes (although possibly also visible on the left sagittal image 41). Interbody implant. Interbody and posterior bone graft material. Thecal sac detail limited by hardware artifact. Trace postoperative gas in the left L5 neural foramen. No obvious stenosis. IMPRESSION: 1. Recent decompression and fusion at L4-L5 and L5-S1. Hardware appears intact, but there is a nondisplaced fracture of the right L4 transverse process extending to the course of the right L4 pedicle screw. And suspicion also of nondisplaced L5 body fractures along the course of both pedicle screws there. Regional postoperative soft tissue changes.  No obvious stenosis. 2. No other acute osseous abnormality identified. Mild for age lumbar spine degeneration with no definite neural impingement. 3. Aortic Atherosclerosis (ICD10-I70.0). Electronically Signed   By: Genevie Ann M.D.   On: 12/29/2021 06:46   CT Hip Right Wo Contrast  Result Date: 12/29/2021 CLINICAL DATA:  Patient fell yesterday.  Right hip pain. EXAM: CT OF THE RIGHT HIP WITHOUT CONTRAST TECHNIQUE: Multidetector CT imaging of the right hip was performed according to the standard protocol. Multiplanar CT image reconstructions were also generated. COMPARISON:  None. FINDINGS: Bones/Joint/Cartilage No evidence for fracture. Specifically, there is no evidence for right pelvic fracture or  proximal right femur fracture. No evidence for joint effusion in the right hip. Ligaments Suboptimally assessed by CT. Muscles and Tendons Unremarkable. Soft tissues No evidence for lymphadenopathy along the right pelvic sidewall. Uterine fibroids evident. IMPRESSION: No findings to explain the patient's history of pain. Electronically Signed   By: Misty Stanley M.D.   On: 12/29/2021 06:08      Franchot Heidelberg, PA-C 12/29/21 2633    Teressa Lower, MD 12/29/21 [phone]

## 2021-12-30 ENCOUNTER — Other Ambulatory Visit: Payer: Self-pay

## 2021-12-30 ENCOUNTER — Emergency Department (HOSPITAL_COMMUNITY)
Admission: EM | Admit: 2021-12-30 | Discharge: 2021-12-30 | Disposition: A | Discharge status: Home / Self Care | Payer: Medicare Other | Attending: Emergency Medicine | Admitting: Emergency Medicine

## 2021-12-30 ENCOUNTER — Encounter (HOSPITAL_COMMUNITY): Payer: Self-pay

## 2021-12-30 ENCOUNTER — Encounter (HOSPITAL_COMMUNITY): Payer: Self-pay | Admitting: Emergency Medicine

## 2021-12-30 DIAGNOSIS — M79604 Pain in right leg: Secondary | ICD-10-CM | POA: Insufficient documentation

## 2021-12-30 DIAGNOSIS — M79605 Pain in left leg: Secondary | ICD-10-CM | POA: Insufficient documentation

## 2021-12-30 DIAGNOSIS — Z5321 Procedure and treatment not carried out due to patient leaving prior to being seen by health care provider: Secondary | ICD-10-CM | POA: Insufficient documentation

## 2021-12-30 LAB — BASIC METABOLIC PANEL
Anion gap: 8 (ref 5–15)
BUN: 9 mg/dL (ref 8–23)
CO2: 19 mmol/L — ABNORMAL LOW (ref 22–32)
Calcium: 9.8 mg/dL (ref 8.9–10.3)
Chloride: 112 mmol/L — ABNORMAL HIGH (ref 98–111)
Creatinine, Ser: 0.74 mg/dL (ref 0.44–1.00)
GFR, Estimated: >60 mL/min (ref >60)
Glucose, Bld: 99 mg/dL (ref 70–99)
Potassium: 3.2 mmol/L — ABNORMAL LOW (ref 3.5–5.1)
Sodium: 139 mmol/L (ref 135–145)

## 2021-12-30 LAB — CBC WITH DIFFERENTIAL/PLATELET
Abs Immature Granulocytes: 0.02 10*3/uL (ref 0.00–0.07)
Basophils Absolute: 0 10*3/uL (ref 0.0–0.1)
Basophils Relative: 1 %
Eosinophils Absolute: 0.1 10*3/uL (ref 0.0–0.5)
Eosinophils Relative: 1 %
HCT: 32.2 % — ABNORMAL LOW (ref 36.0–46.0)
Hemoglobin: 10.6 g/dL — ABNORMAL LOW (ref 12.0–15.0)
Immature Granulocytes: 0 %
Lymphocytes Relative: 27 %
Lymphs Abs: 1.8 10*3/uL (ref 0.7–4.0)
MCH: 29.9 pg (ref 26.0–34.0)
MCHC: 32.9 g/dL (ref 30.0–36.0)
MCV: 90.7 fL (ref 80.0–100.0)
Monocytes Absolute: 0.4 10*3/uL (ref 0.1–1.0)
Monocytes Relative: 6 %
Neutro Abs: 4.2 10*3/uL (ref 1.7–7.7)
Neutrophils Relative %: 65 %
Platelets: 436 10*3/uL — ABNORMAL HIGH (ref 150–400)
RBC: 3.55 MIL/uL — ABNORMAL LOW (ref 3.87–5.11)
RDW: 14.5 % (ref 11.5–15.5)
WBC: 6.5 10*3/uL (ref 4.0–10.5)
nRBC: 0 % (ref 0.0–0.2)

## 2021-12-30 MED ORDER — DIPHENHYDRAMINE HCL 25 MG PO CAPS
25.0000 mg | ORAL_CAPSULE | Freq: Once | ORAL | Status: AC
  Administered 2021-12-30: 25 mg via ORAL
  Filled 2021-12-30: qty 1

## 2021-12-30 MED ORDER — OXYCODONE-ACETAMINOPHEN 5-325 MG PO TABS
1.0000 | ORAL_TABLET | Freq: Once | ORAL | Status: AC
Start: 2021-12-30 — End: 2021-12-30
  Administered 2021-12-30: 1 via ORAL
  Filled 2021-12-30: qty 1

## 2021-12-30 NOTE — ED Notes (Signed)
I attempted to collect labs and was unsuccessful. 

## 2021-12-30 NOTE — ED Provider Triage Note (Signed)
Emergency Medicine Provider Triage Evaluation Note  Tasha [name] , a 66 y.o. female  was evaluated in triage.  Pt complains of bilateral lower extremity pain the past 3 to 4 days.  Patient was evaluated in the emergency room yesterday with plans to follow-up with neurosurgery in clinic.  Patient reports her symptoms have not gotten worse, she returns today because she has not had any improvement of her symptoms since discharge.  She is unaware of any follow-up has been scheduled with neurosurgery at this point.  On chart review CT of lumbar spine did show L4 transverse process fracture as well as nondisplaced L5 body fracture along the course of both pedicle screws.  Review of Systems  Positive: Bilateral lower extremity pain Negative: Back pain, fever  Physical Exam  BP (!) 170/116 (BP Location: Right Arm)    Pulse 100    Temp 98.7 F (37.1 C) (Oral)    Resp 16    Ht 5\' 5"  (1.651 m)    Wt 90.7 kg    LMP 11/03/2012    SpO2 98%    BMI 33.28 kg/m  Gen:   Awake, no distress   Resp:  Normal effort  MSK:   Moves extremities without difficulty  Other:  Tenderness to palpation present over bilateral lower extremities, sensation intact  Medical Decision Making  Medically screening exam initiated at 3:49 PM.  Appropriate orders placed.  Donnia Fraiser was informed that the remainder of the evaluation will be completed by another provider, this initial triage assessment does not replace that evaluation, and the importance of remaining in the ED until their evaluation is complete.     Evlyn Courier, PA-C 12/30/21 1551

## 2021-12-30 NOTE — ED Triage Notes (Signed)
Patient c.o bilateral leg pain from the groin to there feet. Patient states that she had lower back surgery approx 2 weeks ago.

## 2022-01-10 ENCOUNTER — Encounter (HOSPITAL_COMMUNITY): Payer: Self-pay | Admitting: Emergency Medicine

## 2022-01-10 ENCOUNTER — Emergency Department (HOSPITAL_COMMUNITY)
Admission: EM | Admit: 2022-01-10 | Discharge: 2022-01-10 | Disposition: A | Discharge status: Home / Self Care | Payer: Medicare Other | Attending: Emergency Medicine | Admitting: Emergency Medicine

## 2022-01-10 DIAGNOSIS — M79662 Pain in left lower leg: Secondary | ICD-10-CM | POA: Diagnosis not present

## 2022-01-10 DIAGNOSIS — M79661 Pain in right lower leg: Secondary | ICD-10-CM | POA: Insufficient documentation

## 2022-01-10 DIAGNOSIS — Z5321 Procedure and treatment not carried out due to patient leaving prior to being seen by health care provider: Secondary | ICD-10-CM | POA: Insufficient documentation

## 2022-01-10 LAB — COMPREHENSIVE METABOLIC PANEL
ALT: 16 U/L (ref 0–44)
AST: 16 U/L (ref 15–41)
Albumin: 3.2 g/dL — ABNORMAL LOW (ref 3.5–5.0)
Alkaline Phosphatase: 88 U/L (ref 38–126)
Anion gap: 7 (ref 5–15)
BUN: 7 mg/dL — ABNORMAL LOW (ref 8–23)
CO2: 22 mmol/L (ref 22–32)
Calcium: 9.4 mg/dL (ref 8.9–10.3)
Chloride: 113 mmol/L — ABNORMAL HIGH (ref 98–111)
Creatinine, Ser: 0.73 mg/dL (ref 0.44–1.00)
GFR, Estimated: >60 mL/min (ref >60)
Glucose, Bld: 101 mg/dL — ABNORMAL HIGH (ref 70–99)
Potassium: 3.3 mmol/L — ABNORMAL LOW (ref 3.5–5.1)
Sodium: 142 mmol/L (ref 135–145)
Total Bilirubin: 0.5 mg/dL (ref 0.3–1.2)
Total Protein: 6.5 g/dL (ref 6.5–8.1)

## 2022-01-10 LAB — CBC WITH DIFFERENTIAL/PLATELET
Abs Immature Granulocytes: 0.01 10*3/uL (ref 0.00–0.07)
Basophils Absolute: 0 10*3/uL (ref 0.0–0.1)
Basophils Relative: 1 %
Eosinophils Absolute: 0.1 10*3/uL (ref 0.0–0.5)
Eosinophils Relative: 2 %
HCT: 30.3 % — ABNORMAL LOW (ref 36.0–46.0)
Hemoglobin: 9.8 g/dL — ABNORMAL LOW (ref 12.0–15.0)
Immature Granulocytes: 0 %
Lymphocytes Relative: 41 %
Lymphs Abs: 1.6 10*3/uL (ref 0.7–4.0)
MCH: 30 pg (ref 26.0–34.0)
MCHC: 32.3 g/dL (ref 30.0–36.0)
MCV: 92.7 fL (ref 80.0–100.0)
Monocytes Absolute: 0.3 10*3/uL (ref 0.1–1.0)
Monocytes Relative: 7 %
Neutro Abs: 1.9 10*3/uL (ref 1.7–7.7)
Neutrophils Relative %: 49 %
Platelets: 312 10*3/uL (ref 150–400)
RBC: 3.27 MIL/uL — ABNORMAL LOW (ref 3.87–5.11)
RDW: 15.7 % — ABNORMAL HIGH (ref 11.5–15.5)
WBC: 3.9 10*3/uL — ABNORMAL LOW (ref 4.0–10.5)
nRBC: 0 % (ref 0.0–0.2)

## 2022-01-10 NOTE — ED Triage Notes (Signed)
Patient here with complaint of bilateral lower extremity pain that started approximately two weeks ago. Patient also states she has been unable to afford her antihypertensive medications for the last several weeks. Patient alert, oriented, ambulatory with walker, and in no apparent distress at this time.

## 2022-01-10 NOTE — ED Notes (Signed)
Pt was restless with waiting to be seen, decided to leave.

## 2022-05-24 ENCOUNTER — Encounter (HOSPITAL_COMMUNITY): Payer: Self-pay | Admitting: Emergency Medicine

## 2022-05-24 ENCOUNTER — Emergency Department (HOSPITAL_COMMUNITY)
Admission: EM | Admit: 2022-05-24 | Discharge: 2022-05-24 | Disposition: A | Discharge status: Home / Self Care | Payer: Medicare Other | Attending: Emergency Medicine | Admitting: Emergency Medicine

## 2022-05-24 ENCOUNTER — Other Ambulatory Visit: Payer: Self-pay

## 2022-05-24 DIAGNOSIS — M25512 Pain in left shoulder: Secondary | ICD-10-CM | POA: Insufficient documentation

## 2022-05-24 NOTE — Discharge Instructions (Signed)
The MRI that you had recently shows a supraspinatus tear which is part of the rotator cuff as well as a potential tear of the labrum in the shoulder which is cartilage.  Please follow-up with Dr. Ronnie Derby regarding your results.

## 2022-05-24 NOTE — ED Provider Notes (Signed)
[facility]ERGENC[facility] DEPARTMENT Provider Note   CSN: [id number] Arrival date & time: 05/24/22  1058     History  Chief Complaint  Patient presents with   Shoulder Pain    Tasha [name] is a 66 y.o. female.  Patient with history of carotid artery disease, hyperlipidemia --presents emergency department for evaluation of ongoing left shoulder pain.  Patient states that she had an injury several months ago where her shoulder hit a door jam while she was trying to push a cart through a heavy door.  She has had pain since that time.  She is able to move her arm without discomfort but she has a persistent deep pain in the arm.  She states that she has seen several doctors including her orthopedist, Dr. Ronnie Derby.  She has had an injection in the shoulder that has not really helped.  She is taking a muscle relaxer and NSAIDs, again with minimal improvement.  She had an MRI performed about 2 weeks ago.  She has been unable to obtain the results of this test today.  She is here today due to ongoing symptoms.  No worsening.  She feels tingling into her left arm at times and the pain will shoot down her arm.  She denies chest pain or trouble breathing.  No fevers or cough.  MRI IMPRESSION:  1. Ill-definition and signal increase in the distal 2 cm of the supraspinatus is likely due to a combination of severe tendinosis and partial tearing.  2. Moderate AC joint arthrosis.  3. 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 Possible nondisplaced tear of the anterior inferior labrum.        Home Medications Prior to Admission medications   Medication Sig Start Date End Date Taking? Authorizing Provider  acetaminophen (TYLENOL) 650 MG CR tablet Take 1,300 mg by mouth every 8 (eight) hours as needed for pain.    [provider]  amoxicillin-clavulanate (AUGMENTIN) 875-125 MG tablet Take 1 tablet by mouth every 12 (twelve) hours.  04/29/19   Delia Heady, PA-C  aspirin EC 81 MG tablet Take 1 tablet (81 mg total) by mouth daily. 06/01/17   Ledell Noss, MD  cyclobenzaprine (FLEXERIL) 10 MG tablet Take 0.5 tablets (5 mg total) by mouth 2 (two) times daily as needed for muscle spasms. 12/29/21   Caccavale, Sophia, PA-C  EDARBI 80 MG TABS Take 80 mg by mouth daily.  06/11/18   [provider]  ezetimibe (ZETIA) 10 MG tablet Take 1 tablet (10 mg total) by mouth daily. 01/20/19 12/30/21  Lorretta Harp, MD  furosemide (LASIX) 20 MG tablet Take 1 tablet by mouth daily. 03/03/18   [provider]  ibuprofen (ADVIL,MOTRIN) 800 MG tablet Take 800 mg by mouth every 8 (eight) hours as needed for mild pain.  06/06/18   [provider]  LIVALO 4 MG TABS TAKE 1 TABLET BY MOUTH EVERY DAY Patient taking differently: Take 4 mg by mouth daily.  08/16/18   Lorretta Harp, MD  naproxen (NAPROSYN) 375 MG tablet Take 1 tablet (375 mg total) by mouth 2 (two) times daily. 03/17/21   Varney Biles, MD  oxyCODONE-acetaminophen (PERCOCET/ROXICET) 5-325 MG tablet Take 1 tablet by mouth every 8 (eight) hours as needed for severe pain. 12/29/21   Caccavale, Sophia, PA-C  potassium chloride SA (K-DUR,KLOR-CON) 20 MEQ tablet Take 1 tablet (20 mEq total) by mouth daily. 11/27/17   Regalado, Belkys A, MD  tiZANidine (ZANAFLEX) 4 MG  tablet Take 1 tablet (4 mg total) by mouth every 6 (six) hours as needed for muscle spasms. 03/17/21   Varney Biles, MD  traMADol (ULTRAM) 50 MG tablet Take 50-100 mg by mouth every 12 (twelve) hours as needed for moderate pain.  06/06/18   [provider]      Allergies    Antihistamines, chlorpheniramine-type; Sulfamethoxazole; Hydrocodone; Sodium sulfate; and Sulfa antibiotics    Review of Systems   Review of Systems  Physical Exam Updated Vital Signs BP (!) 152/112 (BP Location: Right Arm)   Pulse 73   Temp (!) 97.5 F (36.4 C) (Oral)   Resp 15   LMP 11/03/2012   SpO2 100%  Physical  Exam Vitals and nursing note reviewed.  Constitutional:      Appearance: She is well-developed. She is not diaphoretic.  HENT:     Head: Normocephalic and atraumatic.     Mouth/Throat:     Mouth: Mucous membranes are not dry.  Eyes:     Conjunctiva/sclera: Conjunctivae normal.  Neck:     Vascular: Normal carotid pulses. No JVD.     Trachea: Trachea normal. No tracheal deviation.  Cardiovascular:     Rate and Rhythm: Normal rate and regular rhythm.     Pulses: No decreased pulses.          Radial pulses are 2+ on the right side and 2+ on the left side.     Heart sounds: Normal heart sounds, S1 normal and S2 normal. No murmur heard. Pulmonary:     Effort: Pulmonary effort is normal. No respiratory distress.     Breath sounds: No wheezing.  Chest:     Chest wall: No tenderness.  Abdominal:     General: Bowel sounds are normal.     Palpations: Abdomen is soft.     Tenderness: There is no abdominal tenderness. There is no guarding or rebound.  Musculoskeletal:        General: Normal range of motion.     Left shoulder: Tenderness and bony tenderness present. Normal range of motion.     Cervical back: Normal range of motion and neck supple. No tenderness. No muscular tenderness. Normal range of motion.     Thoracic back: No tenderness. Normal range of motion.     Comments: Tenderness of the shoulder anteriorly, laterally and superiorly.   Skin:    General: Skin is warm and dry.     Coloration: Skin is not pale.  Neurological:     Mental Status: She is alert.    ED Results / Procedures / Treatments   Labs (all labs ordered are listed, but only abnormal results are displayed) Labs Reviewed - No data to display  EKG None  Radiology No results found.  Procedures Procedures    Medications Ordered in ED Medications - No data to display  ED Course/ Medical Decision Making/ A&P    Patient seen and examined.  Most of the patient's concern and frustration is not  understanding why she cannot get the results of her MRI.  I printed these out and discussed these with her today.  I explained that she has tendinosis and a partial tear of the supraspinatus as well as an apparent labrum tear.  She will need to follow-up with her orthopedist regarding these injuries.  She is agreeable to doing this.  She would like to continue with her current regimen for symptom control  Most recent vital signs reviewed and are as follows: BP (!) 152/112 (  BP Location: Right Arm)   Pulse 73   Temp (!) 97.5 F (36.4 C) (Oral)   Resp 15   LMP 11/03/2012   SpO2 100%   Initial impression: Shoulder pain due to supraspinatus tear and labrum injury.  Home treatment plan: Rest, NSAIDs  Return instructions discussed with patient: If she develops changing symptoms such as chest pain, shortness of breath, weakness of the arm  Follow-up instructions discussed with patient: Encouraged follow-up with her orthopedist when able                          Medical Decision Making  Patient with ongoing chronic shoulder pain from an injury.  MRI findings as above.  Left upper extremity appears neurovascularly intact.  This does not appear to be radicular in nature from what I can tell.  No neck pain or disc disease suspected based on the nature of her symptoms.  She denies chest pain or shortness of breath and I have low concern for pneumonia, ACS, PE.        Final Clinical Impression(s) / ED Diagnoses Final diagnoses:  Acute pain of left shoulder    Rx / DC Orders ED Discharge Orders     None         Carlisle Cater, PA-C 05/24/22 1214    Valarie Merino, MD 05/27/22 1049

## 2022-05-24 NOTE — ED Triage Notes (Signed)
Patient from home, complaint of left shoulder pain after injuring it in February. Had an MRI 2 weeks ago at [facility], but states dr unable to tell her what it showed. VSS.

## 2022-05-24 NOTE — ED Notes (Signed)
Patient discharge instructions reviewed with the patient. Patient verbalized understanding of instructions. Patient discharged.

## 2023-07-27 ENCOUNTER — Ambulatory Visit: Payer: Medicare Other | Admitting: Podiatry

## 2023-07-31 ENCOUNTER — Emergency Department (HOSPITAL_COMMUNITY)
Admission: EM | Admit: 2023-07-31 | Discharge: 2023-08-01 | Disposition: A | Discharge status: Home / Self Care | Payer: Self-pay | Attending: Emergency Medicine | Admitting: Emergency Medicine

## 2023-07-31 ENCOUNTER — Other Ambulatory Visit: Payer: Self-pay

## 2023-07-31 ENCOUNTER — Encounter (HOSPITAL_COMMUNITY): Payer: Self-pay

## 2023-07-31 DIAGNOSIS — I10 Essential (primary) hypertension: Secondary | ICD-10-CM | POA: Insufficient documentation

## 2023-07-31 DIAGNOSIS — Z7982 Long term (current) use of aspirin: Secondary | ICD-10-CM | POA: Insufficient documentation

## 2023-07-31 DIAGNOSIS — M1990 Unspecified osteoarthritis, unspecified site: Secondary | ICD-10-CM | POA: Insufficient documentation

## 2023-07-31 MED ORDER — KETOROLAC TROMETHAMINE 15 MG/ML IJ SOLN
30.0000 mg | Freq: Once | INTRAMUSCULAR | Status: AC
  Administered 2023-07-31: 30 mg via INTRAMUSCULAR
  Filled 2023-07-31: qty 2

## 2023-07-31 NOTE — ED Triage Notes (Signed)
Pt presents with R hip pain that radiates down R leg and has been intermittent x 2 weeks. Pt also c/o BLE swelling that was noticed 2 days ago.

## 2023-07-31 NOTE — ED Provider Notes (Signed)
[place] EMERGENCY DEPARTMENT AT [facility] Provider Note   CSN: [id number] Arrival date & time: 07/31/23  2021     History {Add pertinent medical, surgical, social history, OB history to HPI:1} Chief Complaint  Patient presents with   Leg Swelling   Back Pain    Tasha [name] is a 67 y.o. female.  The history is provided by the patient.  Patient with history of hypertension, hyperlipidemia, previous back surgery, previous right hip and knee pain presents with increasing right hip and knee pain.  Patient reports about 2 days ago she slipped and thinks she reinjured her knee and hip.  No other traumatic injuries.  She reports it hurts to walk.  She also reports swelling in both of her legs.  No new chest pain or shortness of breath No new back pain.  She is able to ambulate.  Recently seen by her PCP about 2 days ago was given a pain shot without any relief.  Patient reports something is going on and she needs to figure it out   Past Medical History:  Diagnosis Date   DDD (degenerative disc disease)    DJD.  spine.  chronic pain.    Fibroids    Hyperlipidemia    Hypertension    Hypokalemia 11/26/2017   Pyelonephritis 2004   Vertigo 2014   positional.  Eval with Dr Anne Hahn, neuro, 2014.     Home Medications Prior to Admission medications   Medication Sig Start Date End Date Taking? Authorizing Provider  acetaminophen (TYLENOL) 650 MG CR tablet Take 1,300 mg by mouth every 8 (eight) hours as needed for pain.    [provider]  aspirin EC 81 MG tablet Take 1 tablet (81 mg total) by mouth daily. 06/01/17   Eulah Pont, MD  EDARBI 80 MG TABS Take 80 mg by mouth daily.  06/11/18   [provider]  ezetimibe (ZETIA) 10 MG tablet Take 1 tablet (10 mg total) by mouth daily. 01/20/19 12/30/21  Runell Gess, MD  furosemide (LASIX) 20 MG tablet Take 1 tablet by mouth daily. 03/03/18   [provider]  LIVALO 4 MG TABS TAKE 1 TABLET BY MOUTH EVERY  DAY Patient taking differently: Take 4 mg by mouth daily.  08/16/18   Runell Gess, MD      Allergies    Antihistamines, chlorpheniramine-type; Sulfamethoxazole; Hydrocodone; Sodium sulfate; and Sulfa antibiotics    Review of Systems   Review of Systems  Respiratory:  Negative for shortness of breath.   Cardiovascular:  Negative for chest pain.  Musculoskeletal:  Positive for arthralgias.    Physical Exam Updated Vital Signs BP (!) 158/72   Pulse 63   Temp 97.9 F (36.6 C)   Resp 19   Ht 1.651 m (5\' 5" )   Wt 99.8 kg   LMP 11/03/2012   SpO2 97%   BMI 36.61 kg/m  Physical Exam CONSTITUTIONAL: Well developed/well nourished HEAD: Normocephalic/atraumatic EYES: EOMI/PERRL ENMT: Mucous membranes moist NECK: supple no meningeal signs SPINE/BACK:entire spine nontender CV: S1/S2 noted, no murmurs/rubs/gallops noted LUNGS: Lungs are clear to auscultation bilaterally, no apparent distress ABDOMEN: soft, nontender, no rebound or guarding, bowel sounds noted throughout abdomen GU:no cva tenderness NEURO: Pt is awake/alert/appropriate, moves all extremitiesx4.  No facial droop.   EXTREMITIES: pulses normal/equal, full ROM SKIN: warm, color normal PSYCH: no abnormalities of mood noted, alert and oriented to situation *** ED Results / Procedures / Treatments   Labs (all labs ordered are listed,  but only abnormal results are displayed) Labs Reviewed - No data to display  EKG None  Radiology No results found.  Procedures Procedures  {Document cardiac monitor, telemetry assessment procedure when appropriate:1}  Medications Ordered in ED Medications - No data to display  ED Course/ Medical Decision Making/ A&P   {   Click here for ABCD2, HEART and other calculatorsREFRESH Note before signing :1}                              Medical Decision Making  ***  {Document critical care time when appropriate:1} {Document review of labs and clinical decision tools ie heart  score, Chads2Vasc2 etc:1}  {Document your independent review of radiology images, and any outside records:1} {Document your discussion with family members, caretakers, and with consultants:1} {Document social determinants of health affecting pt's care:1} {Document your decision making why or why not admission, treatments were needed:1} Final Clinical Impression(s) / ED Diagnoses Final diagnoses:  None    Rx / DC Orders ED Discharge Orders     None

## 2023-08-01 NOTE — ED Provider Notes (Incomplete)
[place] EMERGENCY DEPARTMENT AT [facility] Provider Note   CSN: [id number] Arrival date & time: 07/31/23  2021     History {Add pertinent medical, surgical, social history, OB history to HPI:1} Chief Complaint  Patient presents with  . Leg Swelling  . Back Pain    Keyanah Bourque is a 67 y.o. female.  The history is provided by the patient.  Patient with history of hypertension, hyperlipidemia, previous back surgery, previous right hip and knee pain presents with increasing right hip and knee pain.  Patient reports about 2 days ago she slipped and thinks she reinjured her knee and hip.  No other traumatic injuries.  She reports it hurts to walk.  She also reports swelling in both of her legs.  No new chest pain or shortness of breath No new back pain.  She is able to ambulate.  Recently seen by her PCP about 2 days ago was given a pain shot without any relief.  Patient reports something is going on and she needs to figure it out   Past Medical History:  Diagnosis Date  . DDD (degenerative disc disease)    DJD.  spine.  chronic pain.   . Fibroids   . Hyperlipidemia   . Hypertension   . Hypokalemia 11/26/2017  . Pyelonephritis 2004  . Vertigo 2014   positional.  Eval with Dr Anne Hahn, neuro, 2014.     Home Medications Prior to Admission medications   Medication Sig Start Date End Date Taking? Authorizing Provider  acetaminophen (TYLENOL) 650 MG CR tablet Take 1,300 mg by mouth every 8 (eight) hours as needed for pain.    [provider]  aspirin EC 81 MG tablet Take 1 tablet (81 mg total) by mouth daily. 06/01/17   Eulah Pont, MD  EDARBI 80 MG TABS Take 80 mg by mouth daily.  06/11/18   [provider]  ezetimibe (ZETIA) 10 MG tablet Take 1 tablet (10 mg total) by mouth daily. 01/20/19 12/30/21  Runell Gess, MD  furosemide (LASIX) 20 MG tablet Take 1 tablet by mouth daily. 03/03/18   [provider]  LIVALO 4 MG TABS TAKE 1 TABLET BY MOUTH  EVERY DAY Patient taking differently: Take 4 mg by mouth daily.  08/16/18   Runell Gess, MD      Allergies    Antihistamines, chlorpheniramine-type; Sulfamethoxazole; Hydrocodone; Sodium sulfate; and Sulfa antibiotics    Review of Systems   Review of Systems  Respiratory:  Negative for shortness of breath.   Cardiovascular:  Negative for chest pain.  Musculoskeletal:  Positive for arthralgias.    Physical Exam Updated Vital Signs BP (!) 158/72   Pulse 63   Temp 97.9 F (36.6 C)   Resp 19   Ht 1.651 m (5\' 5" )   Wt 99.8 kg   LMP 11/03/2012   SpO2 97%   BMI 36.61 kg/m  Physical Exam CONSTITUTIONAL: Well developed/well nourished HEAD: Normocephalic/atraumatic EYES: EOMI/PERRL ENMT: Mucous membranes moist NECK: supple no meningeal signs SPINE/BACK:entire spine nontender  CV: S1/S2 noted, no murmurs/rubs/gallops noted LUNGS: Lungs are clear to auscultation bilaterally, no apparent distress NEURO: Awake/alert, equal motor 5/5 strength noted with the following: hip flexion/knee flexion/extension, foot dorsi/plantar flexion, great toe extension intact bilaterally, no clonus bilaterally, no sensory deficit in any dermatome. Pt is able to ambulate unassisted. EXTREMITIES: pulses normal, full ROM Lower extremities have mild edema that is symmetric.  There is no erythema.  There is no calf tenderness.  Distal  pulses equal and intact.  No deformities The knees are also symmetric.  Patient has pain elicited when she flexes the right knee.  There is no obvious effusion or erythema. She has full range of motion of the right hip without difficulty. SKIN: warm, color normal PSYCH: no abnormalities of mood noted, alert and oriented to situation  ED Results / Procedures / Treatments   Labs (all labs ordered are listed, but only abnormal results are displayed) Labs Reviewed - No data to display  EKG None  Radiology No results found.  Procedures Procedures  {Document cardiac  monitor, telemetry assessment procedure when appropriate:1}  Medications Ordered in ED Medications - No data to display  ED Course/ Medical Decision Making/ A&P Clinical Course as of 08/01/23 0002  Fri Jul 31, 2023  2336 Review of care everywhere reveals patient had a knee injection performed last month [DW]    Clinical Course User Index [DW] Zadie Rhine, MD   {   Click here for ABCD2, HEART and other calculatorsREFRESH Note before signing :1}                              Medical Decision Making Risk Prescription drug management.   ***  {Document critical care time when appropriate:1} {Document review of labs and clinical decision tools ie heart score, Chads2Vasc2 etc:1}  {Document your independent review of radiology images, and any outside records:1} {Document your discussion with family members, caretakers, and with consultants:1} {Document social determinants of health affecting pt's care:1} {Document your decision making why or why not admission, treatments were needed:1} Final Clinical Impression(s) / ED Diagnoses Final diagnoses:  None    Rx / DC Orders ED Discharge Orders     None

## 2023-08-05 ENCOUNTER — Encounter (HOSPITAL_COMMUNITY): Payer: Self-pay | Admitting: *Deleted

## 2023-08-05 ENCOUNTER — Ambulatory Visit (HOSPITAL_COMMUNITY)
Admission: EM | Admit: 2023-08-05 | Discharge: 2023-08-05 | Disposition: A | Discharge status: Home / Self Care | Payer: Medicare Other

## 2023-08-05 ENCOUNTER — Telehealth (HOSPITAL_COMMUNITY): Payer: Self-pay | Admitting: *Deleted

## 2023-08-05 DIAGNOSIS — L309 Dermatitis, unspecified: Secondary | ICD-10-CM

## 2023-08-05 MED ORDER — PREDNISONE 20 MG PO TABS: 40.0000 mg | ORAL_TABLET | Freq: Every day | ORAL | 0 refills | Status: DC

## 2023-08-05 MED ORDER — KETOCONAZOLE 2 % EX CREA: 1.0000 | TOPICAL_CREAM | Freq: Every day | CUTANEOUS | 0 refills | Status: DC

## 2023-08-05 MED ORDER — PREDNISONE 20 MG PO TABS: 40.0000 mg | ORAL_TABLET | Freq: Every day | ORAL | 0 refills | Status: AC

## 2023-08-05 MED ORDER — KETOCONAZOLE 2 % EX CREA: 1.0000 | TOPICAL_CREAM | Freq: Every day | CUTANEOUS | 0 refills | Status: AC

## 2023-08-05 NOTE — ED Triage Notes (Signed)
Pt states she has had a rash on her face for almost a month. She used hydrocortisone cream and alcohol now its scaly.  She states it feels like little bugs crawling on her face

## 2023-08-05 NOTE — ED Provider Notes (Signed)
[facility]    CSN: [id number] Arrival date & time: 08/05/23  1004      History   Chief Complaint Chief Complaint  Patient presents with   Rash    HPI Tasha [name] is a 67 y.o. female.   Patient here today for evaluation of rash to her face that she has had for almost a month.  She states that she has tried using hydrocortisone cream topically without improvement.  She states she also did use alcohol in hopes to dry up rash but this only worsened symptoms.  She has not had any fever.  She states that rash is itchy.  The history is provided by the patient.  Rash Associated symptoms: no fever, no nausea, no shortness of breath and not vomiting     Past Medical History:  Diagnosis Date   DDD (degenerative disc disease)    DJD.  spine.  chronic pain.    Fibroids    Hyperlipidemia    Hypertension    Hypokalemia 11/26/2017   Pyelonephritis 2004   Vertigo 2014   positional.  Eval with Dr Anne Hahn, neuro, 2014.     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   Hypokalemia 07/26/2014   Constipation 07/26/2014   BRBPR (bright red blood per rectum) 07/26/2014   Hematochezia 07/26/2014   Benign paroxysmal positional vertigo 05/12/2014   Dizziness and giddiness 09/27/2013   Uterine leiomyoma 03/03/2013   Postmenopausal bleeding 03/03/2013   Pelvic pain 03/03/2013   Status post cervical spinal arthrodesis 09/14/2012   Benign neoplasm of cauda equina (HCC) 10/23/2011   Low back pain 10/23/2011    Past Surgical History:  Procedure Laterality Date   BACK SURGERY     CESAREAN SECTION     CHOLECYSTECTOMY  2001   ERCP W/ SPHICTEROTOMY  2001   removal CBD stones   FOOT  SURGERY Right 2017   NECK SURGERY     cadaver bones put in her neck   SKIN BIOPSY  12/2009, 01/2011   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     OB History     Gravida  4   Para  1   Term  1   Preterm  0   AB  3   Living  1      SAB  2   IAB  0   Ectopic  0   Multiple      Live Births  1            Home Medications    Prior to Admission medications   Medication Sig Start Date End Date Taking? Authorizing Provider  acetaminophen (TYLENOL) 650 MG CR tablet Take 1,300 mg by mouth every 8 (eight) hours as needed for pain.   Yes [provider]  amLODipine (NORVASC) 5 MG tablet Take 1 tablet by mouth daily. 11/21/22  Yes [provider]  aspirin EC 81 MG tablet Take 1 tablet (81 mg total) by mouth daily. 06/01/17  Yes Eulah Pont, MD  atorvastatin (LIPITOR) 80 MG tablet Take 1 tablet every day by oral route as directed for 90 days. 04/02/23  Yes [provider]  Cholecalciferol (D 1000) 25 MCG (1000  UT) capsule Take by mouth. 04/02/23  Yes [provider]  hydrALAZINE (APRESOLINE) 25 MG tablet Take 1 tablet by mouth 2 (two) times daily as needed.   Yes [provider]  ibuprofen (ADVIL) 800 MG tablet Take by mouth. 05/25/23  Yes [provider]  losartan (COZAAR) 100 MG tablet Take 1 tablet every day by oral route as directed for 90 days. 02/02/23  Yes [provider]  metoprolol succinate (TOPROL-XL) 50 MG 24 hr tablet Take 1 tablet by mouth daily. 11/11/22  Yes [provider]  EDARBI 80 MG TABS Take 80 mg by mouth daily.  06/11/18   [provider]  ezetimibe (ZETIA) 10 MG tablet Take 1 tablet (10 mg total) by mouth daily. 01/20/19 12/30/21  Runell Gess, MD  furosemide (LASIX) 20 MG tablet Take 1 tablet by mouth daily. 03/03/18   [provider]  ketoconazole (NIZORAL) 2 % cream Apply 1 Application topically daily. 08/05/23   Tomi Bamberger,  PA-C  LIVALO 4 MG TABS TAKE 1 TABLET BY MOUTH EVERY DAY Patient taking differently: Take 4 mg by mouth daily. 08/16/18   Runell Gess, MD  predniSONE (DELTASONE) 20 MG tablet Take 2 tablets (40 mg total) by mouth daily with breakfast for 5 days. 08/05/23 08/10/23  Tomi Bamberger, PA-C    Family History Family History  Problem Relation Age of Onset   Hypertension Mother    Cancer Mother    Hyperlipidemia Mother    Pancreatic cancer Mother    Heart disease Father    Cancer Father    Heart attack Father    Rectal cancer Neg Hx    Stomach cancer Neg Hx    Colon cancer Neg Hx     Social History Social History   Tobacco Use   Smoking status: Never   Smokeless tobacco: Never  Vaping Use   Vaping status: Never Used  Substance Use Topics   Alcohol use: No    Alcohol/week: 0.0 standard drinks of alcohol   Drug use: Yes    Frequency: 7.0 times per week    Types: Marijuana    Comment: took this am, history of crack cocaine abuse     Allergies   Antihistamines, chlorpheniramine-type; Sulfamethoxazole; Hydrocodone; Sodium sulfate; and Sulfa antibiotics   Review of Systems Review of Systems  Constitutional:  Negative for chills and fever.  Eyes:  Negative for discharge and redness.  Respiratory:  Negative for shortness of breath.   Gastrointestinal:  Negative for nausea and vomiting.     Physical Exam Triage Vital Signs ED Triage Vitals  Encounter Vitals Group     BP 08/05/23 1115 (!) 169/106     Systolic BP Percentile --      Diastolic BP Percentile --      Pulse Rate 08/05/23 1115 62     Resp 08/05/23 1115 18     Temp 08/05/23 1115 (!) 97.4 F (36.3 C)     Temp Source 08/05/23 1115 Oral     SpO2 08/05/23 1115 95 %     Weight --      Height --      Head Circumference --      Peak Flow --      Pain Score 08/05/23 1112 0     Pain Loc --      Pain Education --      Exclude from Growth Chart --    No data found.  Updated Vital Signs BP Marland Kitchen)  169/106 (BP  Location: Left Arm)   Pulse 62   Temp (!) 97.4 F (36.3 C) (Oral)   Resp 18   LMP 11/03/2012   SpO2 95%       Physical Exam Vitals and nursing note reviewed.  Constitutional:      General: She is not in acute distress.    Appearance: Normal appearance. She is not ill-appearing.  HENT:     Head: Normocephalic and atraumatic.  Eyes:     Conjunctiva/sclera: Conjunctivae normal.  Cardiovascular:     Rate and Rhythm: Normal rate.  Pulmonary:     Effort: Pulmonary effort is normal. No respiratory distress.  Skin:    Comments: Scaling macular rash noted to mandibular area bilaterally  Neurological:     Mental Status: She is alert.  Psychiatric:        Mood and Affect: Mood normal.        Behavior: Behavior normal.        Thought Content: Thought content normal.      UC Treatments / Results  Labs (all labs ordered are listed, but only abnormal results are displayed) Labs Reviewed - No data to display  EKG   Radiology No results found.  Procedures Procedures (including critical care time)  Medications Ordered in UC Medications - No data to display  Initial Impression / Assessment and Plan / UC Course  I have reviewed the triage vital signs and the nursing notes.  Pertinent labs & imaging results that were available during my care of the patient were reviewed by me and considered in my medical decision making (see chart for details).    Rash seemingly consistent with fungal etiology given scaling appearance.  Will treat with topical ketoconazole.  Questionable if reaction to topical steroid use as well.  Prednisone prescribed to hopefully decrease inflammation.  Recommended follow-up if no gradual improvement or with any further concerns.  Final Clinical Impressions(s) / UC Diagnoses   Final diagnoses:  Dermatitis   Discharge Instructions   None    ED Prescriptions     Medication Sig Dispense Auth. Provider   predniSONE (DELTASONE) 20 MG tablet Take 2  tablets (40 mg total) by mouth daily with breakfast for 5 days. 10 tablet Erma Pinto F, PA-C   ketoconazole (NIZORAL) 2 % cream Apply 1 Application topically daily. 15 g Tomi Bamberger, PA-C      PDMP not reviewed this encounter.   Tomi Bamberger, PA-C 08/05/23 1302

## 2023-11-25 ENCOUNTER — Ambulatory Visit: Payer: Medicare Other | Admitting: Podiatry

## 2023-12-07 ENCOUNTER — Ambulatory Visit: Payer: Medicare Other | Admitting: Podiatry

## 2023-12-07 ENCOUNTER — Encounter: Payer: Self-pay | Admitting: Podiatry

## 2023-12-07 ENCOUNTER — Ambulatory Visit (INDEPENDENT_AMBULATORY_CARE_PROVIDER_SITE_OTHER): Payer: Medicare Other

## 2023-12-07 DIAGNOSIS — M5431 Sciatica, right side: Secondary | ICD-10-CM | POA: Diagnosis not present

## 2023-12-07 DIAGNOSIS — M778 Other enthesopathies, not elsewhere classified: Secondary | ICD-10-CM

## 2023-12-07 NOTE — Progress Notes (Signed)
Chief Complaint  Patient presents with   Foot Pain    Patient states the right that she had surgery in she has been in a lot of pain , this has been going on for about a month or 2. Patient has been taking medication for foot . Patient states it shoots up her right leg not ankle     HPI: 67 y.o. female presenting today as a reestablish new patient for evaluation of pain and tenderness beginning in her right hip extending all the way down her thigh, leg and foot.  Onset about 1 month ago.  She was diagnosed with sciatica by her neurosurgeon and primary care doctor.  She was prescribed some gabapentin as well as a prednisone pack but she did not take the prednisone pack as directed and she says the gabapentin does not help.  She presents for second opinion.  Past Medical History:  Diagnosis Date   DDD (degenerative disc disease)    DJD.  spine.  chronic pain.    Fibroids    Hyperlipidemia    Hypertension    Hypokalemia 11/26/2017   Pyelonephritis 2004   Vertigo 2014   positional.  Eval with Dr Anne Hahn, neuro, 2014.     Past Surgical History:  Procedure Laterality Date   BACK SURGERY     CESAREAN SECTION     CHOLECYSTECTOMY  2001   ERCP W/ SPHICTEROTOMY  2001   removal CBD stones   FOOT SURGERY Right 2017   NECK SURGERY     cadaver bones put in her neck   SKIN BIOPSY  12/2009, 01/2011   8657 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     Allergies  Allergen Reactions   Antihistamines, Chlorpheniramine-Type Itching and Other (See Comments)    Can take with benadryl and is fine   Sulfamethoxazole Itching and Other (See Comments)    Can take with benadryl and is fine   Hydrocodone Hives, Itching and Rash   Sodium Sulfate Hives   Sulfa Antibiotics Itching     Physical Exam: General: The patient is alert and oriented x3 in no acute distress.  Dermatology: Skin is warm, dry and supple bilateral lower extremities.   Vascular: Palpable pedal  pulses bilaterally. Capillary refill within normal limits.  No appreciable edema.  No erythema.  Neurological: Grossly intact via light touch  Musculoskeletal Exam: No pedal deformities noted.  ROM WNL.  There is tenderness throughout palpation diffusely throughout the right foot extending up into the leg, thigh, and buttocks right lower extremity  Radiographic Exam RT foot 12/07/2023:  Posterior heel spur formations just proximal to the posterior aspect of the calcaneal tubercle  Assessment/Plan of Care: 1.  Sciatica right lower extremity x 1 month 2.  History of posterior heel spur resection with repair of Achilles tendon right.  DOS: 04/16/2017  -Patient evaluated.  X-rays reviewed -Patient symptoms more associated to sciatica of the right lower extremity extending from her buttocks and hip down to her lower extremity. -Her neuro spine surgeon prescribed gabapentin with no improvement.  Prednisone pack was also prescribed however she did not take it according to the instructions -Recommend follow-up with PCP or neuro spine surgeon -Return to clinic as needed       Felecia Shelling, DPM [facility]  Dr. Felecia Shelling, DPM    [address]  [place], [place] [address]                Office [phone]  Fax [phone]

## 2024-05-17 ENCOUNTER — Encounter (HOSPITAL_COMMUNITY): Payer: Self-pay

## 2024-05-17 ENCOUNTER — Ambulatory Visit (HOSPITAL_COMMUNITY)
Admission: EM | Admit: 2024-05-17 | Discharge: 2024-05-17 | Disposition: A | Discharge status: Home / Self Care | Payer: Self-pay | Attending: Family Medicine | Admitting: Family Medicine

## 2024-05-17 ENCOUNTER — Ambulatory Visit (INDEPENDENT_AMBULATORY_CARE_PROVIDER_SITE_OTHER): Payer: Self-pay

## 2024-05-17 DIAGNOSIS — M25561 Pain in right knee: Secondary | ICD-10-CM

## 2024-05-17 DIAGNOSIS — I10 Essential (primary) hypertension: Secondary | ICD-10-CM
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
## 2024-05-17 NOTE — Discharge Instructions (Signed)
 Your blood pressure was noted to be elevated during your visit today. If you are currently taking medication for high blood pressure, please ensure you are taking this as directed. If you do not have a history of high blood pressure and your blood pressure remains persistently elevated, you may need to begin taking a medication at some point. You may return here within the next few days to recheck if unable to see your primary care provider or if you do not have a one.  BP (!) 171/86 (BP Location: Left Arm)   Pulse 94   Temp 98.1 F (36.7 C) (Oral)   Resp 18   LMP 11/03/2012   SpO2 96%   BP Readings from Last 3 Encounters:  05/17/24 (!) 171/86  08/05/23 (!) 169/106  07/31/23 (!) 158/72
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
## 2024-05-19 ENCOUNTER — Ambulatory Visit (HOSPITAL_COMMUNITY): Payer: Self-pay

## 2024-05-29 ENCOUNTER — Ambulatory Visit (HOSPITAL_COMMUNITY)
Admission: EM | Admit: 2024-05-29 | Discharge: 2024-05-29 | Disposition: A | Discharge status: Home / Self Care | Attending: Physician Assistant | Admitting: Physician Assistant

## 2024-05-29 ENCOUNTER — Encounter (HOSPITAL_COMMUNITY): Payer: Self-pay | Admitting: Emergency Medicine

## 2024-05-29 DIAGNOSIS — L03115 Cellulitis of right lower limb: Secondary | ICD-10-CM | POA: Diagnosis not present

## 2024-05-29 MED ORDER — DOXYCYCLINE HYCLATE 100 MG PO CAPS: 100.0000 mg | ORAL_CAPSULE | Freq: Two times a day (BID) | ORAL | 0 refills | Status: AC

## 2024-05-29 NOTE — Discharge Instructions (Signed)
 [place]ak area 20 minutes 4 times a day

## 2024-05-29 NOTE — ED Triage Notes (Signed)
 Patient presents with c/o rash on RLE x 2 weeks.

## 2024-05-30 NOTE — ED Provider Notes (Signed)
 [facility]    CSN: [id number] Arrival date & time: 05/29/24  1410      History   Chief Complaint Chief Complaint  Patient presents with   Rash    HPI Tasha [name] is a 68 y.o. female.   Patient complains of a red rash around her right knee.  Patient reports that she has had knee surgery but she is not actually having any problems with the knee.  Patient states that she was outside and thinks that something may have bitten her however the area has had increased redness and she thought she should have it evaluated.  Patient reports that she has not had any fever or chills she denies any body aches.  Patient is unsure what could have bitten her or if this is a rash.  The history is provided by the patient. No language interpreter was used.  Rash   Past Medical History:  Diagnosis Date   DDD (degenerative disc disease)    DJD.  spine.  chronic pain.    Fibroids    Hyperlipidemia    Hypertension    Hypokalemia 11/26/2017   Pyelonephritis 2004   Vertigo 2014   positional.  Eval with Dr Tilda Fogo, neuro, 2014.     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   Hypokalemia 07/26/2014   Constipation 07/26/2014   BRBPR (bright red blood per rectum) 07/26/2014   Hematochezia 07/26/2014   Benign paroxysmal positional vertigo 05/12/2014   Dizziness and giddiness 09/27/2013   Uterine leiomyoma 03/03/2013   Postmenopausal bleeding 03/03/2013   Pelvic pain 03/03/2013   Status post cervical spinal arthrodesis 09/14/2012   Benign neoplasm of cauda equina (HCC) 10/23/2011   Low back pain 10/23/2011    Past Surgical History:  Procedure Laterality Date   BACK SURGERY     CESAREAN  SECTION     CHOLECYSTECTOMY  2001   ERCP W/ SPHICTEROTOMY  2001   removal CBD stones   FOOT SURGERY Right 2017   NECK SURGERY     cadaver bones put in her neck   SKIN BIOPSY  12/2009, 01/2011   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     OB History     Gravida  4   Para  1   Term  1   Preterm  0   AB  3   Living  1      SAB  2   IAB  0   Ectopic  0   Multiple      Live Births  1            Home Medications    Prior to Admission medications   Medication Sig Start Date End Date Taking? Authorizing Provider  doxycycline  (VIBRAMYCIN ) 100 MG capsule Take 1 capsule (100 mg total) by mouth 2 (two) times daily. 05/29/24  Yes Shandrell Boda K, PA-C  acetaminophen  (TYLENOL ) 650 MG CR tablet Take 1,300 mg by mouth every 8 (eight) hours as needed for pain.    [provider]  amLODipine (NORVASC) 5 MG tablet Take 1 tablet by mouth daily. 11/21/22   [provider]  aspirin  EC 81 MG tablet Take 1 tablet (81  mg total) by mouth daily. 06/01/17   Boby Bury, MD  atorvastatin (LIPITOR) 80 MG tablet Take 1 tablet every day by oral route as directed for 90 days. 04/02/23   [provider]  Cholecalciferol (D 1000) 25 MCG (1000 UT) capsule Take by mouth. 04/02/23   [provider]  EDARBI 80 MG TABS Take 80 mg by mouth daily.  06/11/18   [provider]  ezetimibe  (ZETIA ) 10 MG tablet Take 1 tablet (10 mg total) by mouth daily. 01/20/19 12/30/21  Avanell Leigh, MD  furosemide (LASIX) 20 MG tablet Take 1 tablet by mouth daily. 03/03/18   [provider]  hydrALAZINE  (APRESOLINE ) 25 MG tablet Take 1 tablet by mouth 2 (two) times daily as needed.    [provider]  ibuprofen  (ADVIL ) 800 MG tablet Take by mouth. 05/25/23   [provider]  ketoconazole  (NIZORAL ) 2 % cream Apply 1 Application topically daily. 08/05/23   Vernestine Gondola, PA-C  LIVALO  4 MG TABS TAKE 1 TABLET BY MOUTH  EVERY DAY Patient taking differently: Take 4 mg by mouth daily. 08/16/18   Avanell Leigh, MD  losartan (COZAAR) 100 MG tablet Take 1 tablet every day by oral route as directed for 90 days. 02/02/23   [provider]  metoprolol succinate (TOPROL-XL) 50 MG 24 hr tablet Take 1 tablet by mouth daily. 11/11/22   [provider]    Family History Family History  Problem Relation Age of Onset   Hypertension Mother    Cancer Mother    Hyperlipidemia Mother    Pancreatic cancer Mother    Heart disease Father    Cancer Father    Heart attack Father    Rectal cancer Neg Hx    Stomach cancer Neg Hx    Colon cancer Neg Hx     Social History Social History   Tobacco Use   Smoking status: Never   Smokeless tobacco: Never  Vaping Use   Vaping status: Never Used  Substance Use Topics   Alcohol use: No    Alcohol/week: 0.0 standard drinks of alcohol   Drug use: Yes    Frequency: 7.0 times per week    Types: Marijuana    Comment: took this am, history of crack cocaine abuse     Allergies   Antihistamines, chlorpheniramine-type; Sulfamethoxazole; Hydrocodone ; Sodium sulfate; and Sulfa antibiotics   Review of Systems Review of Systems  Skin:  Positive for rash.  All other systems reviewed and are negative.    Physical Exam Triage Vital Signs ED Triage Vitals  Encounter Vitals Group     BP 05/29/24 1451 (!) 148/79     Systolic BP Percentile --      Diastolic BP Percentile --      Pulse Rate 05/29/24 1451 73     Resp 05/29/24 1451 18     Temp 05/29/24 1451 98.9 F (37.2 C)     Temp Source 05/29/24 1451 Oral     SpO2 05/29/24 1451 97 %     Weight --      Height --      Head Circumference --      Peak Flow --      Pain Score 05/29/24 1453 0     Pain Loc --      Pain Education --      Exclude from Growth Chart --    No data found.  Updated Vital Signs BP (!) 148/79 (BP Location: Left Arm)  Pulse 73   Temp 98.9 F (37.2 C) (Oral)   Resp 18    LMP 11/03/2012   SpO2 97%   Visual Acuity Right Eye Distance:   Left Eye Distance:   Bilateral Distance:    Right Eye Near:   Left Eye Near:    Bilateral Near:     Physical Exam Constitutional:      Appearance: Normal appearance.  Musculoskeletal:        General: Swelling and tenderness present. Normal range of motion.     Comments: Red area right medial knee, several scattered pimples that look like insect bites.  Warm to touch,  Neurological:     General: No focal deficit present.     Mental Status: She is alert.      UC Treatments / Results  Labs (all labs ordered are listed, but only abnormal results are displayed) Labs Reviewed - No data to display  EKG   Radiology No results found.  Procedures Procedures (including critical care time)  Medications Ordered in UC Medications - No data to display  Initial Impression / Assessment and Plan / UC Course  I have reviewed the triage vital signs and the nursing notes.  Pertinent labs & imaging results that were available during my care of the patient were reviewed by me and considered in my medical decision making (see chart for details).     Patient has red raised areas appear to be insect bites, may be developing a early infection.  Knee joint is uninvolved.  Patient is given a prescription for doxycycline .  She is advised to soak areas.  Follow-up with her primary care physician for recheck. Final Clinical Impressions(s) / UC Diagnoses   Final diagnoses:  Cellulitis of leg, right   Discharge Instructions      Soak area 20 minutes 4 times a day   ED Prescriptions     Medication Sig Dispense Auth. Provider   doxycycline  (VIBRAMYCIN ) 100 MG capsule Take 1 capsule (100 mg total) by mouth 2 (two) times daily. 20 capsule Emmit Oriley K, PA-C      PDMP not reviewed this encounter. An After Visit Summary was printed and given to the patient.       Sandi Crosby, PA-C 05/30/24 [phone]

## 2024-06-19 ENCOUNTER — Emergency Department (HOSPITAL_COMMUNITY)

## 2024-06-19 ENCOUNTER — Other Ambulatory Visit: Payer: Self-pay

## 2024-06-19 ENCOUNTER — Emergency Department (HOSPITAL_COMMUNITY)
Admission: EM | Admit: 2024-06-19 | Discharge: 2024-06-19 | Disposition: A | Discharge status: Home / Self Care | Attending: Emergency Medicine | Admitting: Emergency Medicine

## 2024-06-19 DIAGNOSIS — Z7982 Long term (current) use of aspirin: Secondary | ICD-10-CM | POA: Diagnosis not present

## 2024-06-19 DIAGNOSIS — I2510 Atherosclerotic heart disease of native coronary artery without angina pectoris: Secondary | ICD-10-CM | POA: Diagnosis not present

## 2024-06-19 DIAGNOSIS — M79661 Pain in right lower leg: Secondary | ICD-10-CM | POA: Diagnosis present

## 2024-06-19 DIAGNOSIS — E876 Hypokalemia: Secondary | ICD-10-CM | POA: Insufficient documentation

## 2024-06-19 DIAGNOSIS — L03115 Cellulitis of right lower limb: Secondary | ICD-10-CM | POA: Diagnosis not present

## 2024-06-19 LAB — CBC
HCT: 32.1 % — ABNORMAL LOW (ref 36.0–46.0)
Hemoglobin: 10.7 g/dL — ABNORMAL LOW (ref 12.0–15.0)
MCH: 29.8 pg (ref 26.0–34.0)
MCHC: 33.3 g/dL (ref 30.0–36.0)
MCV: 89.4 fL (ref 80.0–100.0)
Platelets: 226 10*3/uL (ref 150–400)
RBC: 3.59 MIL/uL — ABNORMAL LOW (ref 3.87–5.11)
RDW: 14.8 % (ref 11.5–15.5)
WBC: 5.4 10*3/uL (ref 4.0–10.5)
nRBC: 0 % (ref 0.0–0.2)

## 2024-06-19 LAB — COMPREHENSIVE METABOLIC PANEL WITH GFR
ALT: 12 U/L (ref 0–44)
AST: 15 U/L (ref 15–41)
Albumin: 3.4 g/dL — ABNORMAL LOW (ref 3.5–5.0)
Alkaline Phosphatase: 88 U/L (ref 38–126)
Anion gap: 7 (ref 5–15)
BUN: 16 mg/dL (ref 8–23)
CO2: 18 mmol/L — ABNORMAL LOW (ref 22–32)
Calcium: 9.5 mg/dL (ref 8.9–10.3)
Chloride: 115 mmol/L — ABNORMAL HIGH (ref 98–111)
Creatinine, Ser: 0.89 mg/dL (ref 0.44–1.00)
GFR, Estimated: >60 mL/min (ref >60)
Glucose, Bld: 104 mg/dL — ABNORMAL HIGH (ref 70–99)
Potassium: 3 mmol/L — ABNORMAL LOW (ref 3.5–5.1)
Sodium: 140 mmol/L (ref 135–145)
Total Bilirubin: 0.7 mg/dL (ref 0.0–1.2)
Total Protein: 6.7 g/dL (ref 6.5–8.1)

## 2024-06-19 LAB — I-STAT CG4 LACTIC ACID, ED: Lactic Acid, Venous: 0.4 mmol/L — ABNORMAL LOW (ref 0.5–1.9)

## 2024-06-19 MED ORDER — CEPHALEXIN 500 MG PO CAPS: 500.0000 mg | ORAL_CAPSULE | Freq: Four times a day (QID) | ORAL | 0 refills | Status: AC

## 2024-06-19 NOTE — Discharge Instructions (Signed)
 You were seen in the ER today for concerns of pain in your knee. It appears you may still have infection present in the skin of this area. Please follow up with orthopedics and start the antibiotics I have prescribed you. For any concerns of new or worsening symptoms, return to the ER.

## 2024-06-19 NOTE — ED Provider Notes (Signed)
 [facility] EM[facility]GENC[facility]PART[facility] AT [facility] Provider Note   CSN: [id number] Arrival date & time: 06/19/24  1642     Patient presents with: Pain all over   Tasha [name] is a 68 y.o. female.  Patient with past history significant for carotid artery disease, benign neoplasm of cauda equina, recent knee surgery presents to the ER with concerns of pain all over.  She states that she had surgery at the end of April for knee replacement with Dr. Hiram.  Reports that she has had follow-up regarding continued pain in the right knee following replacement.  Endorsing current feelings of pain all over and warmth denies any fevers at home.  States that she feels that her knee is not healing well due to concerns of infection.  HPI     Prior to Admission medications   Medication Sig Start Date End Date Taking? Authorizing Provider  cephALEXin  (KEFLEX ) 500 MG capsule Take 1 capsule (500 mg total) by mouth 4 (four) times daily for 5 days. 06/19/24 06/24/24 Yes Salle Brandle A, PA-C  acetaminophen  (TYLENOL ) 650 MG CR tablet Take 1,300 mg by mouth every 8 (eight) hours as needed for pain.    [provider]  amLODipine (NORVASC) 5 MG tablet Take 1 tablet by mouth daily. 11/21/22   [provider]  aspirin  EC 81 MG tablet Take 1 tablet (81 mg total) by mouth daily. 06/01/17   Feliberto Cambric, MD  atorvastatin (LIPITOR) 80 MG tablet Take 1 tablet every day by oral route as directed for 90 days. 04/02/23   [provider]  Cholecalciferol (D 1000) 25 MCG (1000 UT) capsule Take by mouth. 04/02/23   [provider]  doxycycline  (VIBRAMYCIN ) 100 MG capsule Take 1 capsule (100 mg total) by mouth 2 (two) times daily. 05/29/24   Flint Raring K, PA-C  EDARBI 80 MG TABS Take 80 mg by mouth daily.  06/11/18   [provider]  ezetimibe  (ZETIA ) 10 MG tablet Take 1 tablet (10 mg total) by mouth daily. 01/20/19 12/30/21  Court Dorn PARAS, MD  furosemide (LASIX) 20 MG tablet  Take 1 tablet by mouth daily. 03/03/18   [provider]  hydrALAZINE  (APRESOLINE ) 25 MG tablet Take 1 tablet by mouth 2 (two) times daily as needed.    [provider]  ibuprofen  (ADVIL ) 800 MG tablet Take by mouth. 05/25/23   [provider]  ketoconazole  (NIZORAL ) 2 % cream Apply 1 Application topically daily. 08/05/23   Billy Asberry FALCON, PA-C  LIVALO  4 MG TABS TAKE 1 TABLET BY MOUTH EVERY DAY Patient taking differently: Take 4 mg by mouth daily. 08/16/18   Court Dorn PARAS, MD  losartan (COZAAR) 100 MG tablet Take 1 tablet every day by oral route as directed for 90 days. 02/02/23   [provider]  metoprolol succinate (TOPROL-XL) 50 MG 24 hr tablet Take 1 tablet by mouth daily. 11/11/22   [provider]    Allergies: Antihistamines, chlorpheniramine-type; Sulfamethoxazole; Hydrocodone ; Sodium sulfate; and Sulfa antibiotics    Review of Systems  Updated Vital Signs BP (!) 161/84   Pulse (!) 58   Temp 97.9 F (36.6 C) (Oral)   Resp 18   Ht 5' 5 (1.651 m)   Wt 99.8 kg   LMP 11/03/2012   SpO2 100%   BMI 36.61 kg/m   Physical Exam  (all labs ordered are listed, but only abnormal results are displayed) Labs Reviewed  CBC - Abnormal; Notable for the following  components:      Result Value   RBC 3.59 (*)    Hemoglobin 10.7 (*)    HCT 32.1 (*)    All other components within normal limits  COMPREHENSIVE METABOLIC PANEL WITH GFR - Abnormal; Notable for the following components:   Potassium 3.0 (*)    Chloride 115 (*)    CO2 18 (*)    Glucose, Bld 104 (*)    Albumin 3.4 (*)    All other components within normal limits  I-STAT CG4 LACTIC ACID, ED - Abnormal; Notable for the following components:   Lactic Acid, Venous 0.4 (*)    All other components within normal limits    EKG: None  Radiology: DG Knee Complete 4 Views Right Result Date: 06/19/2024 CLINICAL DATA:  Swelling EXAM: RIGHT KNEE - COMPLETE 4+ VIEW COMPARISON:  May 17, 2024 FINDINGS: Well-aligned knee arthroplasty with patellar resurfacing.No acute fracture or dislocation. Moderate joint effusion, unchanged. There is no evidence of arthropathy or other focal bone abnormality. Subcutaneous edema about the knee. No subcutaneous gas or radiopaque foreign body. IMPRESSION: 1. Well-aligned knee arthroplasty without fracture or loosening. Unchanged moderate joint effusion. 2. Subcutaneous edema about the knee. No subcutaneous gas or radiopaque foreign body. This may reflect changes of cellulitis and correlation with physical exam findings requested. Electronically Signed   By: Rogelia Myers M.D.   On: 06/19/2024 20:03     Procedures   Medications Ordered in the ED - No data to display                                  Medical Decision Making Amount and/or Complexity of Data Reviewed Radiology: ordered.  Risk Prescription drug management.   This patient presents to the ED for concern of myalgias, this involves an extensive number of treatment options, and is a complaint that carries with it a high risk of complications and morbidity.  The differential diagnosis includes URI, sepsis, septic arthritis, dehydration   Co morbidities that complicate the patient evaluation  Coronary artery disease, benign neoplasm cauda equina, recent knee surgery   Lab Tests:  I Ordered, and personally interpreted labs.  The pertinent results include: CBC shows anemia with hemoglobin 10.5, CMP unremarkable with only mild hypokalemia seen at 3.0, lactic acid unremarkable at 0.4   Imaging Studies ordered:  I ordered imaging studies including x-ray right knee I independently visualized and interpreted imaging which showed Well-aligned knee arthroplasty without fracture or loosening. Unchanged moderate joint effusion. 2. Subcutaneous edema about the knee. No subcutaneous gas or radiopaque foreign body. This may reflect changes of cellulitis and correlation with physical exam  findings requested. I agree with the radiologist interpretation   Consultations Obtained:  I requested consultation with none,  and discussed lab and imaging findings as well as pertinent plan - they recommend: N/A   Problem List / ED Course / Critical interventions / Medication management  Patient past history significant for coronary artery disease, benign neoplasm cauda equina, recent knee surgery presents ED with concerns of myalgias.  She describes several days of worsening diffuse pain all over but feels that this is due to pain in her right knee.  Had knee replacement at the end of April and states that she had been healing well up until several weeks ago she reports being evaluated for cellulitis and started on antibiotics.  She states that her symptoms did clear up with antibiotics but  feels that she is having worsening pain in the right knee once again with warmth to the area.  Denies any drainage from the site.  No reported fevers at home. On exam, patient has minimal swelling and slight erythema to the right knee.  No obvious areas of fluctuance to suggest abscess.  Patient is able to range the knee without significant difficulty.  The incision site appears to be well-healing although there is a notable scar over this area.   Patient currently on doxycycline  for cellulitis although there is no drainage present which increases concern for strep related cellulitis.  Lab workup obtained for evaluation of possible signs of sepsis with lactic acid as well as CBC to assess for white count. WBC unremarkable at 5.4.  Lactic acid unremarkable at 0.4.  Doubt sepsis.  Doubt septic arthritis.  Suspect likely cellulitis overlying this area.  Given the patient is currently on doxycycline  which is not typically advised for nonpurulent cellulitic findings, will switch patient to Keflex  4 times daily.  Advise close follow-up with orthopedic surgeon for for evaluation of knee itself.  Stable at this time for  outpatient follow-up and discharged home. I have reviewed the patients home medicines and have made adjustments as needed   Social Determinants of Health:  Recent surgery   Test / Admission - Considered:  Patient not meeting inpatient criteria  Final diagnoses:  Cellulitis of right lower extremity    ED Discharge Orders          Ordered    cephALEXin  (KEFLEX ) 500 MG capsule  4 times daily        06/19/24 2145               Saretta Dahlem A, PA-C 06/20/24 2121    Garrick Charleston, MD 06/30/24 2316

## 2024-06-19 NOTE — ED Triage Notes (Signed)
 Patient arrives in wheelchair by POV c/o pain all over. Can I please get a full body MRI? Patient reports right knee surgery in April. Patient has rash to right leg that she was on antibiotics and is unsure of what it is. States she follows up with ortho on the 9th. Patient believes this is stopping her healing on her right knee.

## 2024-07-25 ENCOUNTER — Emergency Department (HOSPITAL_BASED_OUTPATIENT_CLINIC_OR_DEPARTMENT_OTHER)
Admission: EM | Admit: 2024-07-25 | Discharge: 2024-07-25 | Discharge status: Against Medical Advice | Attending: Emergency Medicine | Admitting: Emergency Medicine

## 2024-07-25 ENCOUNTER — Other Ambulatory Visit: Payer: Self-pay

## 2024-07-25 ENCOUNTER — Encounter (HOSPITAL_BASED_OUTPATIENT_CLINIC_OR_DEPARTMENT_OTHER): Payer: Self-pay | Admitting: Emergency Medicine

## 2024-07-25 DIAGNOSIS — I10 Essential (primary) hypertension: Secondary | ICD-10-CM | POA: Diagnosis not present

## 2024-07-25 DIAGNOSIS — Z5329 Procedure and treatment not carried out because of patient's decision for other reasons: Secondary | ICD-10-CM | POA: Insufficient documentation

## 2024-07-25 DIAGNOSIS — S0990XA Unspecified injury of head, initial encounter: Secondary | ICD-10-CM | POA: Diagnosis present

## 2024-07-25 DIAGNOSIS — M546 Pain in thoracic spine: Secondary | ICD-10-CM | POA: Insufficient documentation

## 2024-07-25 DIAGNOSIS — R109 Unspecified abdominal pain: Secondary | ICD-10-CM | POA: Diagnosis not present

## 2024-07-25 DIAGNOSIS — I2510 Atherosclerotic heart disease of native coronary artery without angina pectoris: Secondary | ICD-10-CM | POA: Diagnosis not present

## 2024-07-25 DIAGNOSIS — Z7982 Long term (current) use of aspirin: Secondary | ICD-10-CM | POA: Diagnosis not present

## 2024-07-25 DIAGNOSIS — W11XXXA Fall on and from ladder, initial encounter: Secondary | ICD-10-CM | POA: Insufficient documentation

## 2024-07-25 DIAGNOSIS — M549 Dorsalgia, unspecified: Secondary | ICD-10-CM

## 2024-07-25 DIAGNOSIS — Z79899 Other long term (current) drug therapy: Secondary | ICD-10-CM | POA: Insufficient documentation

## 2024-07-25 DIAGNOSIS — W19XXXA Unspecified fall, initial encounter: Secondary | ICD-10-CM

## 2024-07-25 MED ORDER — MORPHINE SULFATE (PF) 4 MG/ML IV SOLN
4.0000 mg | Freq: Once | INTRAVENOUS | Status: DC
  Filled 2024-07-25: qty 1

## 2024-07-25 NOTE — ED Provider Notes (Signed)
 [facility]ERGENCY DEPARTMENT AT [facility] Provider Note   CSN: [id number] Arrival date & time: 07/25/24  1155     Patient presents with: Fall   Tasha [name] is a 68 y.o. female with a past medical history of vertigo, CAD, HTN, HLD, Neoplasm of cauda equina presents to emergency department for evaluation of head injury, left-sided pain, back pain following fall off of a 2 step ladder on July 11.  Reports that she fell off the top step and hit her head and left side.  Denies LOC, visual disturbances.  Today, sought ED evaluation as she has been having increasing difficulty with getting up from supine position, no improvement to pain.  Specifically, she is worried about abdominal pain that started 2 days ago and has been constant as her mother has a history of pancreatic cancer.  Pain is described as pressure and throbbing 10/10.  Worsens with bending abdomen. Has not tried any OTC medications for pain. No NVD, fevers   {Add pertinent medical, surgical, social history, OB history to YEP:67052}  Fall       Prior to Admission medications   Medication Sig Start Date End Date Taking? Authorizing Provider  acetaminophen  (TYLENOL ) 650 MG CR tablet Take 1,300 mg by mouth every 8 (eight) hours as needed for pain.    [provider]  amLODipine (NORVASC) 5 MG tablet Take 1 tablet by mouth daily. 11/21/22   [provider]  aspirin  EC 81 MG tablet Take 1 tablet (81 mg total) by mouth daily. 06/01/17   Feliberto Cambric, MD  atorvastatin (LIPITOR) 80 MG tablet Take 1 tablet every day by oral route as directed for 90 days. 04/02/23   [provider]  Cholecalciferol (D 1000) 25 MCG (1000 UT) capsule Take by mouth. 04/02/23   [provider]  doxycycline  (VIBRAMYCIN ) 100 MG capsule Take 1 capsule (100 mg total) by mouth 2 (two) times daily. 05/29/24   Flint Raring K, PA-C  EDARBI 80 MG TABS Take 80 mg by mouth daily.  06/11/18   [provider]   ezetimibe  (ZETIA ) 10 MG tablet Take 1 tablet (10 mg total) by mouth daily. 01/20/19 12/30/21  Court Dorn PARAS, MD  furosemide (LASIX) 20 MG tablet Take 1 tablet by mouth daily. 03/03/18   [provider]  hydrALAZINE  (APRESOLINE ) 25 MG tablet Take 1 tablet by mouth 2 (two) times daily as needed.    [provider]  ibuprofen  (ADVIL ) 800 MG tablet Take by mouth. 05/25/23   [provider]  ketoconazole  (NIZORAL ) 2 % cream Apply 1 Application topically daily. 08/05/23   Billy Asberry FALCON, PA-C  LIVALO  4 MG TABS TAKE 1 TABLET BY MOUTH EVERY DAY Patient taking differently: Take 4 mg by mouth daily. 08/16/18   Court Dorn PARAS, MD  losartan (COZAAR) 100 MG tablet Take 1 tablet every day by oral route as directed for 90 days. 02/02/23   [provider]  metoprolol succinate (TOPROL-XL) 50 MG 24 hr tablet Take 1 tablet by mouth daily. 11/11/22   [provider]    Allergies: Antihistamines, chlorpheniramine-type; Sulfamethoxazole; Hydrocodone ; Sodium sulfate; and Sulfa antibiotics    Review of Systems  Musculoskeletal:  Positive for back pain.    Updated Vital Signs BP 119/61   Pulse (!) 52   Temp 97.8 F (36.6 C)   Resp 18   Ht 5' 5 (1.651 m)   Wt 95.3 kg   LMP 11/03/2012   SpO2 98%  BMI 34.95 kg/m   Physical Exam Vitals and nursing note reviewed.  Constitutional:      General: She is not in acute distress.    Appearance: Normal appearance. She is not diaphoretic.  HENT:     Head: Normocephalic and atraumatic.     Comments: No hematoma nor TTP of cranium No crepitus to facial bones    Right Ear: External ear normal. No hemotympanum.     Left Ear: External ear normal. No hemotympanum.     Nose: Nose normal.     Right Nostril: No epistaxis or septal hematoma.     Left Nostril: No epistaxis or septal hematoma.     Mouth/Throat:     Mouth: Mucous membranes are moist. No injury or lacerations.  Eyes:     General: Lids are normal. Vision  grossly intact. No visual field deficit.       Right eye: No discharge.        Left eye: No discharge.     Extraocular Movements: Extraocular movements intact.     Right eye: Normal extraocular motion and no nystagmus.     Left eye: Normal extraocular motion and no nystagmus.     Conjunctiva/sclera: Conjunctivae normal.     Pupils: Pupils are equal, round, and reactive to light.     Comments: No subconjunctival hemorrhage, hyphema, tear drop pupil, or fluid leakage bilaterally.  No signs of EOM entrapment  Neck:     Vascular: No carotid bruit.  Cardiovascular:     Rate and Rhythm: Normal rate.     Pulses: Normal pulses.          Radial pulses are 2+ on the right side and 2+ on the left side.       Dorsalis pedis pulses are 2+ on the right side and 2+ on the left side.  Pulmonary:     Effort: Pulmonary effort is normal. No respiratory distress.     Breath sounds: Normal breath sounds. No wheezing.  Chest:     Chest wall: No deformity, swelling or crepitus.     Comments: TTP of sternum with palpation Abdominal:     General: Bowel sounds are normal. There is no distension.     Palpations: Abdomen is soft.     Tenderness: There is abdominal tenderness in the left upper quadrant and left lower quadrant. There is no guarding or rebound.     Comments: Voluntary guarding. Nonsurgical abdomen with no peritoneal signs  Musculoskeletal:     Cervical back: Full passive range of motion without pain, normal range of motion and neck supple. No deformity, rigidity or bony tenderness. Normal range of motion.     Thoracic back: Bony tenderness present. No deformity or tenderness. Normal range of motion.     Lumbar back: Bony tenderness present. No deformity or tenderness. Normal range of motion.     Right hip: No bony tenderness or crepitus.     Left hip: No bony tenderness or crepitus.     Right lower leg: No edema.     Left lower leg: No edema.     Comments: No obvious deformity to joints or long  bones Pelvis stable with no shortening or rotation of LE bilaterally  Skin:    General: Skin is warm and dry.     Capillary Refill: Capillary refill takes less than 2 seconds.     Comments: No ecchymosis to chest, abdomen, back, nor retroperitoneum  Neurological:     General: No focal deficit  present.     Mental Status: She is alert and oriented to person, place, and time. Mental status is at baseline.     GCS: GCS eye subscore is 4. GCS verbal subscore is 5. GCS motor subscore is 6.     Cranial Nerves: Cranial nerves 2-12 are intact. No cranial nerve deficit, dysarthria or facial asymmetry.     Sensory: Sensation is intact. No sensory deficit.     Motor: Motor function is intact. No weakness, tremor, abnormal muscle tone, seizure activity or pronator drift.     Coordination: Coordination is intact. Coordination normal. Finger-Nose-Finger Test and Heel to [facility] Test normal.     Gait: Gait is intact. Gait normal.     Deep Tendon Reflexes: Reflexes are normal and symmetric. Reflexes normal.     Comments: following commands appropriately.  No aphasia nor slurred speech.  Motor 5/5 and sensation 2/2 BUE and BLE.  Ambulates without difficulty and able to weight-bear on BLE equally.     (all labs ordered are listed, but only abnormal results are displayed) Labs Reviewed - No data to display  EKG: None  Radiology: No results found.    Medications Ordered in the ED - No data to display    {Click here for ABCD2, HEART and other calculators REFRESH Note before signing:1}                              Medical Decision Making Amount and/or Complexity of Data Reviewed Labs: ordered. Radiology: ordered.  Risk Prescription drug management.     Patient presents to the ED for concern of ***, this involves an extensive number of treatment options, and is a complaint that carries with it a high risk of complications and morbidity.  The differential diagnosis includes ***   Co morbidities  that complicate the patient evaluation  ***   Additional history obtained:  Additional history obtained from *** {Blank multiple:19196::EMS,Family,Nursing,Outside Medical Records,Past Admission}   External records from outside source obtained and reviewed including ***   Lab Tests:  I Ordered, and personally interpreted labs.  The pertinent results include:  ***   Imaging Studies ordered:  I ordered imaging studies including ***  I independently visualized and interpreted imaging which showed *** I agree with the radiologist interpretation   Cardiac Monitoring:  The patient was maintained on a cardiac monitor.  I personally viewed and interpreted the cardiac monitored which showed an underlying rhythm of: ***   Medicines ordered and prescription drug management:  I ordered medication including ***  for ***  Reevaluation of the patient after these medicines showed that the patient {resolved/improved/worsened:23923::improved} I have reviewed the patients home medicines and have made adjustments as needed   Test Considered:  ***   Critical Interventions:  ***   Consultations Obtained:  I requested consultation with the ***,  and discussed lab and imaging findings as well as pertinent plan - they recommend: ***   Problem List / ED Course:  Fall Head injury Neurologically intact with no visual disturbances, dizziness, motor, nor sensory deficits. No signs of basilar skull fracture on exam however will obtain CT head as she did have a head injury CT head*** Chest tenderness Tenderness along sternum.  No crepitus, instability.  Will obtain CT chest to ensure no sternal fracture and troponin to ensure no cardiac contusion, injury Abd pain Unsure whether related to trauma or following trauma as patient reports that pain was not  immediately following injury and started only 2 days ago Labs and CT pending Thoracic back pain Lumbar back pain CT  pending No hx of IVDU, malignancy, urinary incontinence, saddle paresthesia   Reevaluation:  After the interventions noted above, I reevaluated the patient and found that they have :{resolved/improved/worsened:23923::improved}   Social Determinants of Health:  Has pcp   Dispostion:  After consideration of the diagnostic results and the patients response to treatment, I feel that the patent would benefit from ***.   Date: 07/25/2024 Patient: Isidora Laham Admitted: 07/25/2024  2:06 PM Attending Provider: Darra Fonda MATSU, MD  Honesty Jama Pollack or her authorized caregiver has made the decision for the patient to leave the emergency department against the advice of Long, Fonda MATSU, MD.  She or her authorized caregiver has been informed and understands the inherent risks, including death.  She or her authorized caregiver has decided to accept the responsibility for this decision. Sierria Lee Gorczyca and all necessary parties have been advised that she may return for further evaluation or treatment. Her condition at time of discharge was Stable.  Karleigh Lee Norville had current vital signs as follows:  Blood pressure 132/64, pulse (!) 54, temperature 97.8 F (36.6 C), resp. rate 18, height 5' 5 (1.651 m), weight 95.3 kg, last menstrual period 11/03/2012, SpO2 100%.   Leeba Jama Pollack or her authorized caregiver has signed the Leaving Against Medical Advice form prior to leaving the department.  Tinnie FORBES Matter 07/25/2024     Final diagnoses:  None    ED Discharge Orders     None

## 2024-07-25 NOTE — ED Triage Notes (Addendum)
 [place] off 2 step ladder  July 11 hit her head  no LOC she states landed left side ,now her  rt upper abd  pain and back is sore and back of leg left side  denies n/v no sob she states

## 2024-07-25 NOTE — ED Notes (Signed)
 This RN went to attempt to get an USIV on pt. At that time, pt was upset that she had been waiting for a while and expressed her desire to leave. This RN explained that there was some bloodwork and CT scans ordered. Pt said that she didn't want CT scans, she just wanted an x-ray and to be out of here by 6 pm to pick up her kids. PA to the bedside to speak to pt. After speaking to the PA, the pt reluctantly wanted to go ahead with the IV and CT's. While placing the USIV, pt said that it was hurting and wanted the needle taken out of her arm. She said that she would just come back another day or go be seen somewhere in [place]. PA made aware. Pt signed AMA paperwork with Marolyn Hummer, RN.

## 2024-07-26 ENCOUNTER — Emergency Department (HOSPITAL_BASED_OUTPATIENT_CLINIC_OR_DEPARTMENT_OTHER): Admitting: Radiology

## 2024-07-26 ENCOUNTER — Encounter (HOSPITAL_BASED_OUTPATIENT_CLINIC_OR_DEPARTMENT_OTHER): Payer: Self-pay | Admitting: Emergency Medicine

## 2024-07-26 ENCOUNTER — Other Ambulatory Visit: Payer: Self-pay

## 2024-07-26 ENCOUNTER — Emergency Department (HOSPITAL_BASED_OUTPATIENT_CLINIC_OR_DEPARTMENT_OTHER)
Admission: EM | Admit: 2024-07-26 | Discharge: 2024-07-26 | Disposition: A | Discharge status: Home / Self Care | Attending: Emergency Medicine | Admitting: Emergency Medicine

## 2024-07-26 DIAGNOSIS — R0781 Pleurodynia: Secondary | ICD-10-CM | POA: Insufficient documentation

## 2024-07-26 DIAGNOSIS — Z7982 Long term (current) use of aspirin: Secondary | ICD-10-CM | POA: Insufficient documentation

## 2024-07-26 MED ORDER — LIDOCAINE 5 % EX PTCH: 1.0000 | MEDICATED_PATCH | CUTANEOUS | 0 refills | Status: DC

## 2024-07-26 MED ORDER — LIDOCAINE 5 % EX PTCH: 1.0000 | MEDICATED_PATCH | CUTANEOUS | 0 refills | Status: AC

## 2024-07-26 MED ORDER — ACETAMINOPHEN 500 MG PO TABS
1000.0000 mg | ORAL_TABLET | Freq: Once | ORAL | Status: AC
  Administered 2024-07-26: 1000 mg via ORAL
  Filled 2024-07-26: qty 2

## 2024-07-26 MED ORDER — LIDOCAINE 5 % EX PTCH
1.0000 | MEDICATED_PATCH | CUTANEOUS | Status: DC
  Administered 2024-07-26: 1 via TRANSDERMAL
  Filled 2024-07-26: qty 1

## 2024-07-26 MED ORDER — KETOROLAC TROMETHAMINE 60 MG/2ML IM SOLN
30.0000 mg | Freq: Once | INTRAMUSCULAR | Status: AC
  Administered 2024-07-26: 30 mg via INTRAMUSCULAR
  Filled 2024-07-26: qty 2

## 2024-07-26 NOTE — ED Triage Notes (Signed)
 Mechanical fall on 7/11. Hit head. Denies LOC. C/o left sided rib pain that rads down into left leg. Was seen at [facility] yesterday but left ama.

## 2024-07-26 NOTE — Discharge Instructions (Signed)
 Your x-ray imaging was negative for evidence of ruptured lung, pneumonia or rib fracture.  Your symptoms are consistent with likely musculoskeletal pain.  For your pain, recommend a short course of Tylenol  and ibuprofen , over-the-counter lidocaine  patches as well.

## 2024-07-26 NOTE — ED Notes (Signed)
 DC paperwork given and verbally understood.

## 2024-07-26 NOTE — ED Provider Notes (Signed)
 [place] EMERGENCY DEPARTMENT AT [facility] Provider Note   CSN: [id number] Arrival date & time: 07/26/24  0840     Patient presents with: Fall   Tasha [name] is a 68 y.o. female.    Fall     68 year old female presenting to the emergency department with left-sided rib pain after a fall.  The patient endorses flank pain along the posterior and left ribs that radiates down to her hip.  She denies any back pain.  She states she had a mechanical fall on 7/11, hit her head, did not lose consciousness.  She is not on anticoagulation.  She had a hematoma on her head which persisted for a week or so and has since resolved.  She denies any headaches or neck pain at this time.  She primarily is concerned for pain in her left ribs.  She initially was seen at [facility] yesterday but left AMA as she did not want CT imaging.  She denies any new falls or trauma.  No abdominal pain, no back pain.  She arrives GCS 15, ABC intact.  Prior to Admission medications   Medication Sig Start Date End Date Taking? Authorizing Provider  lidocaine  (LIDODERM ) 5 % Place 1 patch onto the skin daily. Remove & Discard patch within 12 hours or as directed by MD 07/26/24  Yes Jerrol Agent, MD  acetaminophen  (TYLENOL ) 650 MG CR tablet Take 1,300 mg by mouth every 8 (eight) hours as needed for pain.    [provider]  amLODipine (NORVASC) 5 MG tablet Take 1 tablet by mouth daily. 11/21/22   [provider]  aspirin  EC 81 MG tablet Take 1 tablet (81 mg total) by mouth daily. 06/01/17   Feliberto Cambric, MD  atorvastatin (LIPITOR) 80 MG tablet Take 1 tablet every day by oral route as directed for 90 days. 04/02/23   [provider]  Cholecalciferol (D 1000) 25 MCG (1000 UT) capsule Take by mouth. 04/02/23   [provider]  doxycycline  (VIBRAMYCIN ) 100 MG capsule Take 1 capsule (100 mg total) by mouth 2 (two) times daily. 05/29/24   Flint Raring K, PA-C  EDARBI 80 MG  TABS Take 80 mg by mouth daily.  06/11/18   [provider]  ezetimibe  (ZETIA ) 10 MG tablet Take 1 tablet (10 mg total) by mouth daily. 01/20/19 12/30/21  Court Dorn PARAS, MD  furosemide (LASIX) 20 MG tablet Take 1 tablet by mouth daily. 03/03/18   [provider]  hydrALAZINE  (APRESOLINE ) 25 MG tablet Take 1 tablet by mouth 2 (two) times daily as needed.    [provider]  ibuprofen  (ADVIL ) 800 MG tablet Take by mouth. 05/25/23   [provider]  ketoconazole  (NIZORAL ) 2 % cream Apply 1 Application topically daily. 08/05/23   Billy Asberry FALCON, PA-C  LIVALO  4 MG TABS TAKE 1 TABLET BY MOUTH EVERY DAY Patient taking differently: Take 4 mg by mouth daily. 08/16/18   Court Dorn PARAS, MD  losartan (COZAAR) 100 MG tablet Take 1 tablet every day by oral route as directed for 90 days. 02/02/23   [provider]  metoprolol succinate (TOPROL-XL) 50 MG 24 hr tablet Take 1 tablet by mouth daily. 11/11/22   [provider]  tiZANidine  (ZANAFLEX ) 4 MG tablet Take 4 mg by mouth every 6 (six) hours as needed.    [provider]  traMADol  (ULTRAM ) 50 MG tablet Take 50-100 mg by mouth 2 (two) times daily as needed.  [provider]  Vitamin D, Ergocalciferol, (DRISDOL) 1.25 MG (50000 UNIT) CAPS capsule Take 50,000 Units by mouth once a week.    [provider]    Allergies: Antihistamines, chlorpheniramine-type; Sulfamethoxazole; Hydrocodone ; Sodium sulfate; and Sulfa antibiotics    Review of Systems  All other systems reviewed and are negative.   Updated Vital Signs BP 131/62 (BP Location: Right Arm)   Pulse 64   Temp (!) 97.5 F (36.4 C) (Oral)   Resp 18   LMP 11/03/2012   SpO2 100%   Physical Exam Vitals and nursing note reviewed.  Constitutional:      General: She is not in acute distress.    Appearance: She is well-developed.     Comments: GCS 15, ABC intact  HENT:     Head: Normocephalic and atraumatic.  Eyes:      Extraocular Movements: Extraocular movements intact.     Conjunctiva/sclera: Conjunctivae normal.     Pupils: Pupils are equal, round, and reactive to light.  Neck:     Comments: No midline tenderness to palpation of the cervical spine.  Range of motion intact Cardiovascular:     Rate and Rhythm: Normal rate and regular rhythm.     Heart sounds: No murmur heard. Pulmonary:     Effort: Pulmonary effort is normal. No respiratory distress.     Breath sounds: Normal breath sounds.  Chest:     Comments: Clavicles stable nontender to AP compression.  Chest wall stable with left sided rib TTP Abdominal:     Palpations: Abdomen is soft.     Tenderness: There is no abdominal tenderness.     Comments: Pelvis stable to lateral compression  Musculoskeletal:     Cervical back: Neck supple.     Comments: No midline tenderness to palpation of the thoracic or lumbar spine.  Extremities atraumatic with intact range of motion  Skin:    General: Skin is warm and dry.  Neurological:     Mental Status: She is alert.     Comments: Cranial nerves II through XII grossly intact.  Moving all 4 extremities spontaneously.  Sensation grossly intact all 4 extremities     (all labs ordered are listed, but only abnormal results are displayed) Labs Reviewed - No data to display  EKG: None  Radiology: DG Ribs Unilateral W/Chest Left Result Date: 07/26/2024 CLINICAL DATA:  Left-sided rib pain EXAM: LEFT RIBS AND CHEST - 3 VIEW COMPARISON:  Chest radiograph dated 07/10/2021 FINDINGS: No fracture or other bone lesions are seen involving the ribs. There is no evidence of pneumothorax or pleural effusion. Both lungs are clear. Heart size and mediastinal contours are within normal limits. Cervical spinal fixation hardware appears intact. Right upper quadrant surgical clips. IMPRESSION: No radiographic finding of displaced rib fracture. Electronically Signed   By: Limin  Xu M.D.   On: 07/26/2024 11:34     Procedures    Medications Ordered in the ED  lidocaine  (LIDODERM ) 5 % 1 patch (1 patch Transdermal Patch Applied 07/26/24 1050)  acetaminophen  (TYLENOL ) tablet 1,000 mg (1,000 mg Oral Given 07/26/24 1050)  ketorolac  (TORADOL ) injection 30 mg (30 mg Intramuscular Given 07/26/24 1052)                                    Medical Decision Making Amount and/or Complexity of Data Reviewed Radiology: ordered.  Risk OTC drugs. Prescription drug management.    68 year old female  presenting to the emergency department with left-sided rib pain after a fall.  The patient endorses flank pain along the posterior and left ribs that radiates down to her hip.  She denies any back pain.  She states she had a mechanical fall on 7/11, hit her head, did not lose consciousness.  She is not on anticoagulation.  She had a hematoma on her head which persisted for a week or so and has since resolved.  She denies any headaches or neck pain at this time.  She primarily is concerned for pain in her left ribs.  She initially was seen at [facility] yesterday but left AMA as she did not want CT imaging.  She denies any new falls or trauma.  No abdominal pain, no back pain.  She arrives GCS 15, ABC intact.  On arrival, the patient was vitally stable, on exam the patient had left-sided rib tenderness to palpation, no CVA tenderness, no midline tenderness of the back, no abdominal tenderness to palpation, overall reassuring exam.  Patient presents several weeks after a fall.  Denies headache, no neurologic deficit.  Do not think CT imaging of the head is indicated at this time.  Very low concern for retroperitoneal hematoma, other intra-abdominal injury.  No other evidence of trauma on primary or secondary survey.  Will obtain x-ray imaging of the ribs, administer lidocaine  patch and pain medicine and reassess.  CXR with ribs on right: Negative  Patient feeling symptomatically improved, advised Tylenol  and NSAIDs, over-the-counter  lidocaine  patch for continued pain management, outpatient follow-up with her primary care provider to ensure resolution.     Final diagnoses:  Rib pain on left side    ED Discharge Orders          Ordered    lidocaine  (LIDODERM ) 5 %  Every 24 hours        07/26/24 1138               Jerrol Agent, MD 07/26/24 1140

## 2024-08-15 ENCOUNTER — Encounter (HOSPITAL_COMMUNITY): Payer: Self-pay | Admitting: Emergency Medicine

## 2024-08-15 ENCOUNTER — Ambulatory Visit (HOSPITAL_COMMUNITY)
Admission: EM | Admit: 2024-08-15 | Discharge: 2024-08-15 | Disposition: A | Discharge status: Home / Self Care | Attending: Emergency Medicine | Admitting: Emergency Medicine

## 2024-08-15 DIAGNOSIS — S0502XA Injury of conjunctiva and corneal abrasion without foreign body, left eye, initial encounter: Secondary | ICD-10-CM | POA: Diagnosis not present

## 2024-08-15 MED ORDER — ERYTHROMYCIN 5 MG/GM OP OINT
1.0000 | TOPICAL_OINTMENT | Freq: Four times a day (QID) | OPHTHALMIC | 0 refills | Status: DC
Start: 2024-08-15 — End: 2024-08-15

## 2024-08-15 MED ORDER — ERYTHROMYCIN 5 MG/GM OP OINT: 1.0000 | TOPICAL_OINTMENT | Freq: Four times a day (QID) | OPHTHALMIC | 0 refills | Status: AC

## 2024-08-15 NOTE — ED Provider Notes (Signed)
 [facility]    CSN: [id number] Arrival date & time: 08/15/24  0941      History   Chief Complaint Chief Complaint  Patient presents with   Eye Drainage    HPI Tasha [name] is a 68 y.o. female.   Patient presents with left eye itching that began yesterday.  Patient states that she has been rubbing at her eye because it has been slightly itchy and now she has redness and watery drainage from the eye.  Patient endorses foreign body sensation.  Denies wearing contacts or glasses.  Patient denies any visual disturbances.  Patient also denies any trauma to her eye.  The history is provided by the patient and medical records.    Past Medical History:  Diagnosis Date   DDD (degenerative disc disease)    DJD.  spine.  chronic pain.    Fibroids    Hyperlipidemia    Hypertension    Hypokalemia 11/26/2017   Pyelonephritis 2004   Vertigo 2014   positional.  Eval with Dr Jenel, neuro, 2014.     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   Hypokalemia 07/26/2014   Constipation 07/26/2014   BRBPR (bright red blood per rectum) 07/26/2014   Hematochezia 07/26/2014   Benign paroxysmal positional vertigo 05/12/2014   Dizziness and giddiness 09/27/2013   Uterine leiomyoma 03/03/2013   Postmenopausal bleeding 03/03/2013   Pelvic pain 03/03/2013   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     OB History     Gravida  4   Para  1   Term  1   Preterm  0   AB  3   Living  1      SAB  2   IAB  0   Ectopic  0   Multiple      Live Births  1            Home Medications    Prior to Admission medications   Medication Sig Start Date End Date Taking? Authorizing Provider  acetaminophen  (TYLENOL ) 650 MG CR tablet Take 1,300 mg by mouth every 8 (eight) hours as needed for pain.    [provider]  amLODipine (NORVASC) 5 MG tablet Take 1 tablet by mouth daily. 11/21/22   [provider]  aspirin  EC 81 MG tablet Take 1 tablet (81 mg total) by mouth daily. 06/01/17   Feliberto Cambric, MD  atorvastatin (LIPITOR) 80 MG tablet Take 1 tablet every day by oral route as directed for 90 days. 04/02/23   [provider]  Cholecalciferol (D 1000) 25 MCG (1000 UT) capsule Take by mouth. 04/02/23   [provider]  doxycycline  (VIBRAMYCIN ) 100 MG capsule Take 1 capsule (100 mg total) by mouth 2 (two) times daily. 05/29/24   Flint Raring K, PA-C  EDARBI 80 MG TABS Take 80 mg by mouth daily.  06/11/18   [provider]  erythromycin  ophthalmic ointment Place 1 Application into the left eye 4 (four) times daily for 5 days. Place a 1/2 inch ribbon of ointment into the lower eyelid. 08/15/24 08/20/24  Johnie Flaming A, NP  ezetimibe  (ZETIA ) 10 MG tablet Take 1 tablet (10 mg total) by mouth daily. 01/20/19 12/30/21  Court Dorn PARAS, MD  furosemide (LASIX) 20 MG tablet Take 1 tablet by mouth daily. 03/03/18   [provider]  hydrALAZINE  (APRESOLINE ) 25 MG tablet Take 1 tablet by mouth 2 (two) times daily as needed.    [provider]  ibuprofen  (ADVIL ) 800 MG tablet Take by mouth. 05/25/23   [provider]  ketoconazole  (NIZORAL ) 2 % cream Apply 1 Application topically daily.  08/05/23   Billy Asberry FALCON, PA-C  lidocaine  (LIDODERM ) 5 % Place 1 patch onto the skin daily. Remove & Discard patch within 12 hours or as directed by MD 07/26/24   Lorelle Aleck BROCKS, PA-C  LIVALO  4 MG TABS TAKE 1 TABLET BY MOUTH EVERY DAY Patient taking differently: Take 4 mg by mouth daily. 08/16/18   Court Dorn PARAS, MD  losartan (COZAAR) 100 MG tablet Take 1 tablet every day by oral route as directed for 90 days. 02/02/23   [provider]  metoprolol succinate (TOPROL-XL) 50 MG 24 hr tablet Take 1 tablet by mouth daily. 11/11/22   [provider]  tiZANidine  (ZANAFLEX ) 4 MG tablet Take 4 mg by mouth every 6 (six) hours as needed.    [provider]  traMADol  (ULTRAM ) 50 MG tablet Take 50-100 mg by mouth 2 (two) times daily as needed.    [provider]  Vitamin D, Ergocalciferol, (DRISDOL) 1.25 MG (50000 UNIT) CAPS capsule Take 50,000 Units by mouth once a week.    [provider]    Family History Family History  Problem Relation Age of Onset   Hypertension Mother    Cancer Mother    Hyperlipidemia Mother    Pancreatic cancer Mother    Heart disease Father    Cancer Father    Heart attack Father    Rectal cancer Neg Hx    Stomach cancer Neg Hx    Colon cancer Neg Hx     Social History Social History   Tobacco Use   Smoking status: Never   Smokeless tobacco: Never  Vaping Use   Vaping status: Never Used  Substance Use Topics   Alcohol use: No    Alcohol/week: 0.0 standard drinks of alcohol   Drug use: Yes    Frequency: 7.0 times per week    Types: Marijuana    Comment: took this am, history of crack cocaine abuse     Allergies   Antihistamines, chlorpheniramine-type; Sulfamethoxazole; Hydrocodone ; Sodium sulfate; and Sulfa antibiotics   Review of Systems Review of Systems  Per HPI  Physical Exam Triage Vital Signs ED Triage Vitals [08/15/24 1108]  Encounter Vitals Group     BP 138/81     Girls Systolic BP  Percentile      Girls Diastolic BP Percentile      Boys Systolic BP Percentile      Boys Diastolic BP Percentile      Pulse Rate 60     Resp 18  Temp 97.8 F (36.6 C)     Temp Source Oral     SpO2 96 %     Weight      Height      Head Circumference      Peak Flow      Pain Score      Pain Loc      Pain Education      Exclude from Growth Chart    No data found.  Updated Vital Signs BP 138/81 (BP Location: Right Arm)   Pulse 60   Temp 97.8 F (36.6 C) (Oral)   Resp 18   LMP 11/03/2012   SpO2 96%   Visual Acuity Right Eye Distance:   Left Eye Distance:   Bilateral Distance:    Right Eye Near:   Left Eye Near:    Bilateral Near:     Physical Exam Vitals and nursing note reviewed.  Constitutional:      General: She is awake. She is not in acute distress.    Appearance: Normal appearance. She is well-developed and well-groomed. She is not ill-appearing.  Eyes:     General:        Left eye: No discharge.     Extraocular Movements: Extraocular movements intact.     Conjunctiva/sclera:     Left eye: Left conjunctiva is injected.     Pupils: Pupils are equal, round, and reactive to light.     Comments: Fluorescein stain performed and corneal abrasion noted below iris  Neurological:     Mental Status: She is alert.  Psychiatric:        Behavior: Behavior is cooperative.      UC Treatments / Results  Labs (all labs ordered are listed, but only abnormal results are displayed) Labs Reviewed - No data to display  EKG   Radiology No results found.  Procedures Procedures (including critical care time)  Medications Ordered in UC Medications - No data to display  Initial Impression / Assessment and Plan / UC Course  I have reviewed the triage vital signs and the nursing notes.  Pertinent labs & imaging results that were available during my care of the patient were reviewed by me and considered in my medical decision making (see chart for details).      Patient is overall well-appearing.  Vitals are stable.  Exam findings consistent with corneal abrasion.  Prescribed erythromycin  ointment.  Discussed follow-up and return precautions. Final Clinical Impressions(s) / UC Diagnoses   Final diagnoses:  Abrasion of left cornea, initial encounter     Discharge Instructions      Apply erythromycin  ointment 4 times daily to your left lower eyelid for 5 days. You may return to work as this is not contagious. Follow-up with your primary care provider or return here as needed.   ED Prescriptions     Medication Sig Dispense Auth. Provider   erythromycin  ophthalmic ointment  (Status: Discontinued) Place 1 Application into the right eye 4 (four) times daily for 5 days. Place a 1/2 inch ribbon of ointment into the lower eyelid. 3.5 g Johnie Flaming A, NP   erythromycin  ophthalmic ointment Place 1 Application into the left eye 4 (four) times daily for 5 days. Place a 1/2 inch ribbon of ointment into the lower eyelid. 3.5 g Johnie Flaming A, NP      PDMP not reviewed this encounter.   Johnie Flaming A, NP 08/15/24 1146

## 2024-08-15 NOTE — Discharge Instructions (Addendum)
 Apply erythromycin  ointment 4 times daily to your left lower eyelid for 5 days. You may return to work as this is not contagious. Follow-up with your primary care provider or return here as needed.

## 2024-08-15 NOTE — ED Triage Notes (Signed)
 Pt has redness and drainage from left eye that started yesterday. Reports itching.

## 2024-09-11 ENCOUNTER — Encounter (HOSPITAL_COMMUNITY): Payer: Self-pay | Admitting: *Deleted

## 2024-09-11 ENCOUNTER — Emergency Department (HOSPITAL_COMMUNITY)
Admission: EM | Admit: 2024-09-11 | Discharge: 2024-09-12 | Disposition: A | Discharge status: Home / Self Care | Attending: Emergency Medicine | Admitting: Emergency Medicine

## 2024-09-11 ENCOUNTER — Ambulatory Visit (HOSPITAL_COMMUNITY)
Admission: EM | Admit: 2024-09-11 | Discharge: 2024-09-11 | Disposition: A | Discharge status: Home / Self Care | Attending: Internal Medicine | Admitting: Internal Medicine

## 2024-09-11 ENCOUNTER — Other Ambulatory Visit: Payer: Self-pay

## 2024-09-11 ENCOUNTER — Encounter (HOSPITAL_COMMUNITY): Payer: Self-pay

## 2024-09-11 DIAGNOSIS — I10 Essential (primary) hypertension: Secondary | ICD-10-CM | POA: Diagnosis not present

## 2024-09-11 DIAGNOSIS — Z79899 Other long term (current) drug therapy: Secondary | ICD-10-CM | POA: Insufficient documentation

## 2024-09-11 DIAGNOSIS — N898 Other specified noninflammatory disorders of vagina: Secondary | ICD-10-CM

## 2024-09-11 DIAGNOSIS — Z7982 Long term (current) use of aspirin: Secondary | ICD-10-CM | POA: Insufficient documentation

## 2024-09-11 DIAGNOSIS — N7689 Other specified inflammation of vagina and vulva: Secondary | ICD-10-CM | POA: Insufficient documentation

## 2024-09-11 DIAGNOSIS — N909 Noninflammatory disorder of vulva and perineum, unspecified: Secondary | ICD-10-CM

## 2024-09-11 LAB — WET PREP, GENITAL
Clue Cells Wet Prep HPF POC: NONE SEEN
Sperm: NONE SEEN
Trich, Wet Prep: NONE SEEN
WBC, Wet Prep HPF POC: <10 (ref <10)
Yeast Wet Prep HPF POC: NONE SEEN

## 2024-09-11 MED ORDER — NYSTATIN 100000 UNIT/GM EX CREA: TOPICAL_CREAM | CUTANEOUS | 0 refills | Status: AC

## 2024-09-11 NOTE — Discharge Instructions (Addendum)
 Vaginal irritation and itching. On treatment for vaginal yeast infection with diflucan. Will add topical medication to help with symptoms. We will treat with the following:  Nystatin  cream twice daily to the affected area for 7 days.  Use mild soap  Continue diflucan as previously prescribed.  Return to urgent care or PCP if symptoms worsen or fail to resolve.

## 2024-09-11 NOTE — ED Provider Notes (Signed)
 [facility]    CSN: [id number] Arrival date & time: 09/11/24  1227      History   Chief Complaint Chief Complaint  Patient presents with   Vaginal Discharge    HPI Tasha [name] is a 68 y.o. female.   68 year old female presents urgent care with complaints of vaginal discharge, itching and irritation.  She reports that this has been going on about a week.  She did see her primary care doctor regarding this on Friday and was started on Diflucan.  She has taken 1 dose of this and will be taking the next dose on Tuesday.  She reports that she is having a lot of external irritation and itching and would like something topical.  She denies any other changes since her visit with the primary care provider.   Vaginal Discharge Associated symptoms: no abdominal pain, no dysuria, no fever and no vomiting     Past Medical History:  Diagnosis Date   DDD (degenerative disc disease)    DJD.  spine.  chronic pain.    Fibroids    Hyperlipidemia    Hypertension    Hypokalemia 11/26/2017   Pyelonephritis 2004   Vertigo 2014   positional.  Eval with Dr Jenel, neuro, 2014.     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   Hypokalemia 07/26/2014   Constipation 07/26/2014   BRBPR (bright red blood per rectum) 07/26/2014   Hematochezia 07/26/2014   Benign paroxysmal positional vertigo 05/12/2014   Dizziness and giddiness 09/27/2013   Uterine leiomyoma 03/03/2013   Postmenopausal bleeding 03/03/2013   Pelvic pain 03/03/2013   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     OB History     Gravida  4   Para  1   Term  1   Preterm  0   AB  3   Living  1      SAB  2   IAB  0   Ectopic  0   Multiple      Live Births  1            Home Medications    Prior to Admission medications   Medication Sig Start Date End Date Taking? Authorizing Provider  nystatin  cream (MYCOSTATIN ) Apply to affected area 2 times daily 09/11/24  Yes Kino Dunsworth A, PA-C  acetaminophen  (TYLENOL ) 650 MG CR tablet Take 1,300 mg by mouth every 8 (eight) hours as needed for pain.    [provider]  amLODipine (NORVASC) 5 MG tablet Take 1 tablet by mouth daily. 11/21/22   [provider]  aspirin  EC 81 MG tablet Take 1 tablet (81 mg total) by  mouth daily. 06/01/17   Feliberto Cambric, MD  atorvastatin (LIPITOR) 80 MG tablet Take 1 tablet every day by oral route as directed for 90 days. 04/02/23   [provider]  Cholecalciferol (D 1000) 25 MCG (1000 UT) capsule Take by mouth. 04/02/23   [provider]  doxycycline  (VIBRAMYCIN ) 100 MG capsule Take 1 capsule (100 mg total) by mouth 2 (two) times daily. 05/29/24   Flint Raring K, PA-C  EDARBI 80 MG TABS Take 80 mg by mouth daily.  06/11/18   [provider]  ezetimibe  (ZETIA ) 10 MG tablet Take 1 tablet (10 mg total) by mouth daily. 01/20/19 12/30/21  Court Dorn PARAS, MD  furosemide (LASIX) 20 MG tablet Take 1 tablet by mouth daily. 03/03/18   [provider]  hydrALAZINE  (APRESOLINE ) 25 MG tablet Take 1 tablet by mouth 2 (two) times daily as needed.    [provider]  ibuprofen  (ADVIL ) 800 MG tablet Take by mouth. 05/25/23   [provider]  ketoconazole  (NIZORAL ) 2 % cream Apply 1 Application topically daily. 08/05/23   Billy Asberry FALCON, PA-C  lidocaine  (LIDODERM ) 5 % Place 1 patch onto the skin daily. Remove & Discard patch within 12 hours or as directed by MD 07/26/24   Lorelle Aleck BROCKS, PA-C  LIVALO  4 MG TABS TAKE 1 TABLET BY MOUTH EVERY DAY Patient taking differently: Take 4 mg by mouth daily. 08/16/18   Court Dorn PARAS, MD  losartan (COZAAR) 100 MG tablet Take 1 tablet every day by oral route as directed for 90 days. 02/02/23   [provider]  metoprolol succinate (TOPROL-XL) 50 MG 24 hr tablet Take 1 tablet by mouth daily. 11/11/22   [provider]  tiZANidine  (ZANAFLEX ) 4 MG tablet Take 4 mg by mouth every 6 (six) hours as needed.    [provider]  traMADol  (ULTRAM ) 50 MG tablet Take 50-100 mg by mouth 2 (two) times daily as needed.    [provider]  Vitamin D, Ergocalciferol, (DRISDOL) 1.25 MG (50000 UNIT) CAPS capsule Take 50,000 Units by mouth once a week.    [provider]    Family History Family History  Problem Relation Age of Onset   Hypertension Mother    Cancer Mother    Hyperlipidemia Mother    Pancreatic cancer Mother    Heart disease Father    Cancer Father    Heart attack Father    Rectal cancer Neg Hx    Stomach cancer Neg Hx    Colon cancer Neg Hx     Social History Social History   Tobacco Use   Smoking status: Never   Smokeless tobacco: Never  Vaping Use   Vaping status: Never Used  Substance Use Topics   Alcohol use: No    Alcohol/week: 0.0 standard drinks of alcohol   Drug use: Yes    Frequency: 7.0 times per week    Types: Marijuana    Comment: took this am, history of crack cocaine abuse     Allergies   Antihistamines, chlorpheniramine-type; Sulfamethoxazole; Hydrocodone ; Sodium sulfate; and Sulfa antibiotics   Review of Systems Review of Systems  Constitutional:  Negative for chills and fever.  HENT:   Negative for ear pain and sore throat.   Eyes:  Negative for pain and visual disturbance.  Respiratory:  Negative for cough and shortness of breath.   Cardiovascular:  Negative for chest pain and palpitations.  Gastrointestinal:  Negative for abdominal pain and vomiting.  Genitourinary:  Positive for vaginal discharge. Negative for dysuria and hematuria.       Vaginal itching and irritation.   Musculoskeletal:  Negative for arthralgias and back pain.  Skin:  Negative for color change and rash.  Neurological:  Negative for seizures and syncope.  All other systems reviewed and are negative.    Physical Exam Triage Vital Signs ED Triage Vitals [09/11/24 1243]  Encounter Vitals Group     BP 121/63     Girls Systolic BP Percentile      Girls Diastolic BP Percentile      Boys Systolic BP Percentile      Boys Diastolic BP Percentile      Pulse Rate (!) 58     Resp 18     Temp 98.1 F (36.7 C)     Temp Source Oral     SpO2 98 %     Weight      Height      Head Circumference      Peak Flow      Pain Score      Pain Loc      Pain Education      Exclude from Growth Chart    No data found.  Updated Vital Signs BP 121/63 (BP Location: Left Arm)   Pulse (!) 58   Temp 98.1 F (36.7 C) (Oral)   Resp 18   LMP 11/03/2012   SpO2 98%   Visual Acuity Right Eye Distance:   Left Eye Distance:   Bilateral Distance:    Right Eye Near:   Left Eye Near:    Bilateral Near:     Physical Exam Vitals and nursing note reviewed.  Constitutional:      General: She is not in acute distress.    Appearance: She is well-developed.  HENT:     Head: Normocephalic and atraumatic.  Eyes:     Conjunctiva/sclera: Conjunctivae normal.  Cardiovascular:     Rate and Rhythm: Normal rate and regular rhythm.     Heart sounds: No murmur heard. Pulmonary:     Effort: Pulmonary effort is normal. No respiratory distress.     Breath sounds: Normal breath sounds.  Abdominal:     Palpations: Abdomen  is soft.     Tenderness: There is no abdominal tenderness.  Musculoskeletal:        General: No swelling.     Cervical back: Neck supple.  Skin:    General: Skin is warm and dry.     Capillary Refill: Capillary refill takes less than 2 seconds.  Neurological:     Mental Status: She is alert.  Psychiatric:        Mood and Affect: Mood normal.      UC Treatments / Results  Labs (all labs ordered are listed, but only abnormal results are displayed) Labs Reviewed - No data to display  EKG   Radiology No results found.  Procedures Procedures (including critical care time)  Medications Ordered in UC Medications - No data to display  Initial Impression / Assessment and Plan / UC Course  I have reviewed the triage vital signs and the nursing notes.  Pertinent labs & imaging results that were available during my care of the patient were reviewed by me and considered in my medical decision making (see chart for details).     Vaginal itching  Vaginal irritation   Vaginal irritation and itching. On treatment for vaginal yeast infection with diflucan. Will add topical medication  to help with symptoms. We will treat with the following:  Nystatin  cream twice daily to the affected area for 7 days.  Use mild soap  Continue diflucan as previously prescribed.  Return to urgent care or PCP if symptoms worsen or fail to resolve.    Final Clinical Impressions(s) / UC Diagnoses   Final diagnoses:  Vaginal itching  Vaginal irritation     Discharge Instructions      Vaginal irritation and itching. On treatment for vaginal yeast infection with diflucan. Will add topical medication to help with symptoms. We will treat with the following:  Nystatin  cream twice daily to the affected area for 7 days.  Use mild soap  Continue diflucan as previously prescribed.  Return to urgent care or PCP if symptoms worsen or fail to resolve.      ED Prescriptions     Medication Sig Dispense  Auth. Provider   nystatin  cream (MYCOSTATIN ) Apply to affected area 2 times daily 30 g Teresa Almarie LABOR, PA-C      PDMP not reviewed this encounter.   Teresa Almarie LABOR, PA-C 09/11/24 1435

## 2024-09-11 NOTE — ED Triage Notes (Signed)
 Pt present vaginal discharge with itching and burning. Symptom started over a week ago.

## 2024-09-11 NOTE — ED Triage Notes (Signed)
 Pt has been having itching and burning on her labia as well as clitoris and skin folds in groin for a week.  Pt denies any discharge from her vagina.  She states that this is getting more uncomfortable.  She was seen by her PCP and given diflucan and today she saw [facility] and was given nystatin  but neither is providing any relief.  Pt states that she experiencing burning when urine touches her skin as it is raw.

## 2024-09-12 LAB — URINALYSIS, ROUTINE W REFLEX MICROSCOPIC
Bilirubin Urine: NEGATIVE
Glucose, UA: NEGATIVE mg/dL
Hgb urine dipstick: NEGATIVE
Ketones, ur: NEGATIVE mg/dL
Leukocytes,Ua: NEGATIVE
Nitrite: NEGATIVE
Protein, ur: NEGATIVE mg/dL
Specific Gravity, Urine: 1.013 (ref 1.005–1.030)
pH: 7 (ref 5.0–8.0)

## 2024-09-12 LAB — GC/CHLAMYDIA PROBE AMP (~~LOC~~) NOT AT ARMC
Chlamydia: NEGATIVE
Comment: NEGATIVE
Comment: NORMAL
Neisseria Gonorrhea: NEGATIVE

## 2024-09-12 LAB — CBG MONITORING, ED: Glucose-Capillary: 122 mg/dL — ABNORMAL HIGH (ref 70–99)

## 2024-09-12 NOTE — ED Provider Notes (Signed)
 [place] EMERGENCY DEPARTMENT AT [facility] Provider Note   CSN: [id number] Arrival date & time: 09/11/24  2322     Patient presents with: Vaginitis   Tasha [name] is a 68 y.o. female.   68 year old female presents to the emergency department for evaluation of persistent itching and burning to her external genitalia.  She states that symptoms began approximately 1 week ago after using a Monistat vaginal suppository for presumed yeast infection.  She saw her primary care doctor who prescribed Diflucan.  She has taken 1 dose of this and has a second dose to take tomorrow.  Went to urgent care today as she received no relief after using her first Diflucan dose.  Was given additional topical nystatin , but feels worsening burning and discomfort with application of this.  No associated fevers, discharge, urinary frequency or urgency. Patient is not a diabetic.  The history is provided by the patient. No language interpreter was used.       Prior to Admission medications   Medication Sig Start Date End Date Taking? Authorizing Provider  acetaminophen  (TYLENOL ) 650 MG CR tablet Take 1,300 mg by mouth every 8 (eight) hours as needed for pain.    [provider]  amLODipine (NORVASC) 5 MG tablet Take 1 tablet by mouth daily. 11/21/22   [provider]  aspirin  EC 81 MG tablet Take 1 tablet (81 mg total) by mouth daily. 06/01/17   Feliberto Cambric, MD  atorvastatin (LIPITOR) 80 MG tablet Take 1 tablet every day by oral route as directed for 90 days. 04/02/23   [provider]  Cholecalciferol (D 1000) 25 MCG (1000 UT) capsule Take by mouth. 04/02/23   [provider]  doxycycline  (VIBRAMYCIN ) 100 MG capsule Take 1 capsule (100 mg total) by mouth 2 (two) times daily. 05/29/24   Flint Raring K, PA-C  EDARBI 80 MG TABS Take 80 mg by mouth daily.  06/11/18   [provider]  ezetimibe  (ZETIA ) 10 MG tablet Take 1 tablet (10 mg total) by mouth daily.  01/20/19 12/30/21  [name] Dorn PARAS, MD  furosemide (LASIX) 20 MG tablet Take 1 tablet by mouth daily. 03/03/18   [provider]  hydrALAZINE  (APRESOLINE ) 25 MG tablet Take 1 tablet by mouth 2 (two) times daily as needed.    [provider]  ibuprofen  (ADVIL ) 800 MG tablet Take by mouth. 05/25/23   [provider]  ketoconazole  (NIZORAL ) 2 % cream Apply 1 Application topically daily. 08/05/23   Billy Asberry FALCON, PA-C  lidocaine  (LIDODERM ) 5 % Place 1 patch onto the skin daily. Remove & Discard patch within 12 hours or as directed by MD 07/26/24   Lorelle Aleck BROCKS, PA-C  LIVALO  4 MG TABS TAKE 1 TABLET BY MOUTH EVERY DAY Patient taking differently: Take 4 mg by mouth daily. 08/16/18   [name] Dorn PARAS, MD  losartan (COZAAR) 100 MG tablet Take 1 tablet every day by oral route as directed for 90 days. 02/02/23   [provider]  metoprolol succinate (TOPROL-XL) 50 MG 24 hr tablet Take 1 tablet by mouth daily. 11/11/22   [provider]  nystatin  cream (MYCOSTATIN ) Apply to affected area 2 times daily 09/11/24   Teresa Norris A, PA-C  tiZANidine  (ZANAFLEX ) 4 MG tablet Take 4 mg by mouth every 6 (six) hours as needed.    [provider]  traMADol  (ULTRAM ) 50 MG tablet Take 50-100 mg by mouth 2 (two) times daily as needed.  [provider]  Vitamin D, Ergocalciferol, (DRISDOL) 1.25 MG (50000 UNIT) CAPS capsule Take 50,000 Units by mouth once a week.    [provider]    Allergies: Antihistamines, chlorpheniramine-type; Sulfamethoxazole; Hydrocodone ; Sodium sulfate; and Sulfa antibiotics    Review of Systems Ten systems reviewed and are negative for acute change, except as noted in the HPI.    Updated Vital Signs BP (!) 152/86 (BP Location: Right Arm)   Pulse 62   Temp 97.6 F (36.4 C)   Resp 16   LMP 11/03/2012   SpO2 100%   Physical Exam Vitals and nursing note reviewed.  Constitutional:      General: She is not in  acute distress.    Appearance: She is well-developed. She is not diaphoretic.     Comments: Nontoxic appearing and in NAD  HENT:     Head: Normocephalic and atraumatic.  Eyes:     General: No scleral icterus.    Conjunctiva/sclera: Conjunctivae normal.  Pulmonary:     Effort: Pulmonary effort is normal. No respiratory distress.  Genitourinary:    Comments: Exam chaperoned by RN. There is no swelling to the labia major, minor, or clitoral hood. No erythema, induration, heat to touch. Absence of discharge or weeping fluid; no purulence. No desquamation of skin, ulcerations, or other lesions. Musculoskeletal:        General: Normal range of motion.     Cervical back: Normal range of motion.  Skin:    General: Skin is warm and dry.     Coloration: Skin is not pale.     Findings: No erythema or rash.  Neurological:     Mental Status: She is alert and oriented to person, place, and time.  Psychiatric:        Behavior: Behavior normal.     (all labs ordered are listed, but only abnormal results are displayed) Labs Reviewed  URINALYSIS, ROUTINE W REFLEX MICROSCOPIC - Abnormal; Notable for the following components:      Result Value   APPearance HAZY (*)    All other components within normal limits  CBG MONITORING, ED - Abnormal; Notable for the following components:   Glucose-Capillary 122 (*)    All other components within normal limits  WET PREP, GENITAL  GC/CHLAMYDIA PROBE AMP ([place]) NOT AT [facility]    EKG: None  Radiology: No results found.   Procedures   Medications Ordered in the ED - No data to display                                  Medical Decision Making Amount and/or Complexity of Data Reviewed Labs: ordered.   This patient presents to the ED for concern of vaginal burning and itching, this involves an extensive number of treatment options, and is a complaint that carries with it a high risk of complications and morbidity.  The differential diagnosis  includes yeast vaginitis vs STD vs vulvodynia vs BV vs UTI   Co morbidities that complicate the patient evaluation  HLD HTN   Additional history obtained:  External records from outside source obtained and reviewed including urgent care visit on 09/11/24.   Lab Tests:  I Ordered, and personally interpreted labs.  The pertinent results include:  CBG 122. UA and wet prep are normal.   Cardiac Monitoring:  The patient was maintained on a cardiac monitor.  I personally viewed and interpreted the cardiac monitored  which showed an underlying rhythm of: NSR   Medicines ordered and prescription drug management:  I have reviewed the patients home medicines and have made adjustments as needed   Problem List / ED Course:  Ongoing vaginal irritation, burning, itching since use of Monistat. Presumed yeast infection, but no discharge and no presence of yeast on wet prep. If this was an issue, it appears to have been adequately treated with prior diflucan dose. No changes on exam to suggest intertrigo-like picture. No concern for STI based on stated hx; GC/Chlamydia pending. No evidence of secondary infection or cellulitis. Discussed that addition of abx would likely make symptoms worse. Counseled on supportive care with cooling agents (I.e. topical ice packs, aloe). Advised NSAIDs for irritation. May have some utility in trying Zyrtec/Claritin for itching. Referral given to OBGYN   Reevaluation:  After the interventions noted above, I reevaluated the patient and found that they have :stayed the same   Social Determinants of Health:  Lives independently   Dispostion:  After consideration of the diagnostic results and the patients response to treatment, I feel that the patent would benefit from supportive care w/OBGYN follow up if symptoms persist. Return precautions discussed and provided. Patient discharged in stable condition with no unaddressed concerns.       Final  diagnoses:  Irritation of external female genitalia    ED Discharge Orders          Ordered    Ambulatory referral to Gynecology        09/12/24 0051               Keith Sor, DEVONNA 09/12/24 0105    Carita Senior, MD 09/12/24 [id number]

## 2024-09-12 NOTE — Discharge Instructions (Addendum)
 It is possible that you may have vulvodynia given your reassuring work up in the ED. Your wet prep did not show the presence of yeast and your urine was not infected. You will be notified if your STD tests return positive.  We advise trying topical cooling agents, such as Bordeaux's butt paste with aloe. You may also try the [place] instant ice maxi pads or Lansinoh cold pads for irritation and soothing. Use over-the-counter Aleve  for management of ongoing pain. Zyrtec or Claritin may be helpful to manage itching. Follow up with OBGYN for recurrent or worsening symptoms.

## 2024-12-14 ENCOUNTER — Ambulatory Visit

## 2024-12-14 ENCOUNTER — Ambulatory Visit: Admitting: Podiatry

## 2024-12-14 DIAGNOSIS — M7752 Other enthesopathy of left foot: Secondary | ICD-10-CM

## 2024-12-14 DIAGNOSIS — G5792 Unspecified mononeuropathy of left lower limb: Secondary | ICD-10-CM

## 2024-12-14 NOTE — Progress Notes (Signed)
" ° °  Chief Complaint  Patient presents with   Foot Pain    L foot pain dorsal mid foot to 5th toe lateral x 6 months. Dropped large can on it.  Not diabetic. 81 mg Asprin    HPI: 68 y.o. femalepresenting for evaluation of dorsal midfoot pain sporadic, intermittent over the past 3 months.  She states that about 6 months ago she did have some fifth toe lateral pain however it has resolved.  No history of injury.  Past Medical History:  Diagnosis Date   DDD (degenerative disc disease)    DJD.  spine.  chronic pain.    Fibroids    Hyperlipidemia    Hypertension    Hypokalemia 11/26/2017   Pyelonephritis 2004   Vertigo 2014   positional.  Eval with Dr Jenel, neuro, 2014.     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     Allergies[1]   Physical Exam: General: The patient is alert and oriented x3 in no acute distress.  Dermatology: Skin is warm, dry and supple bilateral lower extremities.   Vascular: Palpable pedal pulses bilaterally. Capillary refill within normal limits.  No appreciable edema.  No erythema.  Neurological: Grossly intact via light touch  Musculoskeletal Exam: No pedal deformities noted  Radiographic Exam LT foot 12/14/2024:  Normal osseous mineralization. Joint spaces preserved.  No fractures or irregularities noted.  Impression: Normal exam  Assessment/Plan of Care: 1.  Neuritis left midfoot-intermittent secondary to shoes  - Patient evaluated.  X-rays reviewed -Recommend conservative treatment including shoes that do not irritate or constrict the dorsal midfoot.  She does notice that certain shoes exacerbate her symptoms -Return to clinic PRN      Thresa EMERSON Sar, DPM [facility]  Dr. Thresa EMERSON Sar, DPM    [address] [place] [address]                Office [phone]  Fax [phone]      [1]  Allergies Allergen Reactions   Antihistamines, Chlorpheniramine-Type Itching and Other (See Comments)    Can take with benadryl  and is fine   Sulfamethoxazole Itching and Other (See Comments)    Can take with benadryl  and is fine   Hydrocodone  Hives, Itching and Rash   Sodium Sulfate Hives   Sulfa Antibiotics Itching   "
